# Patient Record
Sex: Female | Born: 1940 | Race: Black or African American | Hispanic: No | Marital: Married | Smoking: Former smoker
Health system: Southern US, Community
[De-identification: ages and names within clinical notes are randomized; demographics above are authoritative.]

## PROBLEM LIST (undated history)

## (undated) DIAGNOSIS — I5022 Chronic systolic (congestive) heart failure: Secondary | ICD-10-CM

## (undated) DIAGNOSIS — E119 Type 2 diabetes mellitus without complications: Secondary | ICD-10-CM

## (undated) DIAGNOSIS — R001 Bradycardia, unspecified: Secondary | ICD-10-CM

## (undated) DIAGNOSIS — I10 Essential (primary) hypertension: Secondary | ICD-10-CM

## (undated) DIAGNOSIS — I2510 Atherosclerotic heart disease of native coronary artery without angina pectoris: Secondary | ICD-10-CM

## (undated) DIAGNOSIS — E785 Hyperlipidemia, unspecified: Secondary | ICD-10-CM

## (undated) DIAGNOSIS — E669 Obesity, unspecified: Secondary | ICD-10-CM

## (undated) DIAGNOSIS — I959 Hypotension, unspecified: Secondary | ICD-10-CM

## (undated) DIAGNOSIS — I255 Ischemic cardiomyopathy: Secondary | ICD-10-CM

## (undated) DIAGNOSIS — I080 Rheumatic disorders of both mitral and aortic valves: Secondary | ICD-10-CM

## (undated) HISTORY — DX: Hyperlipidemia, unspecified: E78.5

## (undated) HISTORY — DX: Hypotension, unspecified: I95.9

## (undated) HISTORY — DX: Chronic systolic (congestive) heart failure: I50.22

## (undated) HISTORY — DX: Essential (primary) hypertension: I10

## (undated) HISTORY — DX: Obesity, unspecified: E66.9

## (undated) HISTORY — DX: Bradycardia, unspecified: R00.1

## (undated) HISTORY — DX: Type 2 diabetes mellitus without complications: E11.9

## (undated) HISTORY — DX: Rheumatic disorders of both mitral and aortic valves: I08.0

## (undated) HISTORY — DX: Ischemic cardiomyopathy: I25.5

## (undated) HISTORY — DX: Atherosclerotic heart disease of native coronary artery without angina pectoris: I25.10

---

## 2000-08-10 ENCOUNTER — Inpatient Hospital Stay (HOSPITAL_COMMUNITY): Admission: RE | Admit: 2000-08-10 | Discharge: 2000-08-19 | Payer: Self-pay | Admitting: Cardiology

## 2000-08-10 ENCOUNTER — Encounter: Payer: Self-pay | Admitting: Cardiology

## 2000-08-13 ENCOUNTER — Encounter: Payer: Self-pay | Admitting: Cardiothoracic Surgery

## 2000-08-13 HISTORY — PX: CORONARY ARTERY BYPASS GRAFT: SHX141

## 2000-08-14 ENCOUNTER — Encounter: Payer: Self-pay | Admitting: Cardiothoracic Surgery

## 2000-08-15 ENCOUNTER — Encounter: Payer: Self-pay | Admitting: Cardiothoracic Surgery

## 2000-08-16 ENCOUNTER — Encounter: Payer: Self-pay | Admitting: Cardiothoracic Surgery

## 2005-02-02 ENCOUNTER — Ambulatory Visit: Payer: Self-pay | Admitting: Cardiology

## 2005-02-08 ENCOUNTER — Ambulatory Visit: Payer: Self-pay | Admitting: Cardiology

## 2005-03-22 ENCOUNTER — Ambulatory Visit: Payer: Self-pay | Admitting: Cardiology

## 2005-11-20 ENCOUNTER — Ambulatory Visit: Payer: Self-pay | Admitting: Cardiology

## 2005-11-28 ENCOUNTER — Ambulatory Visit: Payer: Self-pay | Admitting: Cardiology

## 2006-12-17 ENCOUNTER — Ambulatory Visit: Payer: Self-pay | Admitting: Cardiology

## 2007-01-31 ENCOUNTER — Ambulatory Visit: Payer: Self-pay | Admitting: Cardiology

## 2007-03-04 ENCOUNTER — Ambulatory Visit: Payer: Self-pay | Admitting: Cardiology

## 2007-12-03 ENCOUNTER — Encounter: Payer: Self-pay | Admitting: Physician Assistant

## 2008-04-08 ENCOUNTER — Ambulatory Visit: Payer: Self-pay | Admitting: Cardiology

## 2008-04-13 ENCOUNTER — Ambulatory Visit: Payer: Self-pay | Admitting: Cardiology

## 2008-04-13 ENCOUNTER — Encounter: Payer: Self-pay | Admitting: Cardiology

## 2008-07-19 ENCOUNTER — Encounter: Payer: Self-pay | Admitting: Cardiology

## 2008-08-19 DIAGNOSIS — E663 Overweight: Secondary | ICD-10-CM

## 2008-08-19 DIAGNOSIS — I25810 Atherosclerosis of coronary artery bypass graft(s) without angina pectoris: Secondary | ICD-10-CM

## 2008-08-19 DIAGNOSIS — I080 Rheumatic disorders of both mitral and aortic valves: Secondary | ICD-10-CM

## 2008-08-19 DIAGNOSIS — I255 Ischemic cardiomyopathy: Secondary | ICD-10-CM

## 2008-08-19 DIAGNOSIS — E785 Hyperlipidemia, unspecified: Secondary | ICD-10-CM

## 2008-08-19 DIAGNOSIS — I10 Essential (primary) hypertension: Secondary | ICD-10-CM

## 2008-11-01 ENCOUNTER — Ambulatory Visit: Payer: Self-pay | Admitting: Cardiology

## 2009-05-16 ENCOUNTER — Ambulatory Visit: Payer: Self-pay | Admitting: Cardiology

## 2010-02-28 NOTE — Assessment & Plan Note (Signed)
Summary: 6 MO FU PER APRIL REMINDER  Medications Added FISH OIL 1000 MG CAPS (OMEGA-3 FATTY ACIDS) Take 3 tablet by mouth once daily IRON 325 (65 FE) MG TABS (FERROUS SULFATE) Take 2 tablet by mouth once a day VITAMIN D3 1000 UNIT CAPS (CHOLECALCIFEROL) Take 3 tablet by mouth once a day      Allergies Added:   Visit Type:  Follow-up Primary Provider:  Dr.Chirapa Sinthusek([place])  CC:  follow-up visit.  History of Present Illness: the patient is a 70 year old female history of coronary arteries, status post coronary bypass grafting in LV function of 40%. she has history of inferior posterior scar. She reports no heart failure symptoms. She denies any substernal chest pain. She has no orthopnea PND or patient's or syncope. She also history of mild mitralregurgitation but is asymptomatic. From a cardiovascular standpoint the patient is quite stable.  Clinical Review Panels:  Echocardiogram Echocardiogram 1. The images are fair 2. Mild concentric left ventricular hypertrophy is observed 3. there is severe hypokinesis of the inferior and posterior walls 4. the EF is 40% 5. The right ventricle is not well visualized, but is probably normal  6. No significant vavlular abnormalities are noted (04/13/2008)    Preventive Screening-Counseling & Management  Alcohol-Tobacco     Smoking Status: quit     Year Quit: 1998  Current Medications (verified): 1)  Furosemide 40 Mg Tabs (Furosemide) .... Take 1 Tablet By Mouth Once A Day 2)  Oscal 500/200 D-3 500-200 Mg-Unit Tabs (Calcium-Vitamin D) .... Take 1 Tablet By Mouth Once A Day 3)  Metformin Hcl 500 Mg Tabs (Metformin Hcl) .... Take 1 Tablet By Mouth Once A Day and 1/2 By Mouth in Pm 4)  Fish Oil 1000 Mg Caps (Omega-3 Fatty Acids) .... Take 3 Tablet By Mouth Once Daily 5)  Iron 325 (65 Fe) Mg Tabs (Ferrous Sulfate) .... Take 2 Tablet By Mouth Once A Day 6)  Multivitamins  Tabs (Multiple Vitamin) .... Take 1 Tablet By Mouth Once A  Day 7)  Simvastatin 40 Mg Tabs (Simvastatin) .... Take 1 Tablet By Mouth Once A Day 8)  Metoprolol Succinate 25 Mg Xr24h-Tab (Metoprolol Succinate) .... Take 1 Tablet By Mouth Once A Day 9)  Aspirin 81 Mg Tbec (Aspirin) .... Take 1 Tablet By Mouth Once A Day 10)  Allopurinol 300 Mg Tabs (Allopurinol) .... Take 1 Tablet By Mouth Once A Day 11)  Losartan Potassium 100 Mg Tabs (Losartan Potassium) .... Take 1 Tablet By Mouth Once A Day 12)  Vitamin D3 1000 Unit Caps (Cholecalciferol) .... Take 3 Tablet By Mouth Once A Day  Allergies (verified): 1)  ! Sulfa  Comments:  Nurse/Medical Assistant: The patient's medications and allergies were reviewed with the patient and were updated in the Medication and Allergy Lists. Bottles reviewed.  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  Alcohol Use - no  Risk Factors: Smoking Status: quit (05/16/2009)  Social History: Smoking Status:  quit  Vital Signs:  Patient profile:   70 year old female Height:      62 inches Weight:  209 pounds Pulse rate:   66 / minute BP sitting:   111 / 71  (left arm) Cuff size:   large  Vitals Entered By: Carlye Grippe (May 16, 2009 10:20 AM) CC: follow-up visit   Physical Exam  Additional Exam:  General: Well-developed, well-nourished in no distress head: Normocephalic and atraumatic eyes PERRLA/EOMI intact, conjunctiva and lids normal nose: No deformity or  lesions mouth normal dentition, normal posterior pharynx neck: Supple, no JVD.  No masses, thyromegaly or abnormal cervical nodes lungs: Normal breath sounds bilaterally without wheezing.  Normal percussion heart: regular rate and rhythm with normal S1 and S2, no S3 or S4.  PMI is normal.  No pathological murmurs abdomen: Normal bowel sounds, abdomen is soft and nontender without masses, organomegaly or hernias noted.  No hepatosplenomegaly musculoskeletal: Back normal, normal gait muscle strength and tone normal pulsus: Pulse is normal in all 4 extremities Extremities: No peripheral pitting edema neurologic: Alert and oriented x 3 skin: Intact without lesions or rashes cervical nodes: No significant adenopathy psychologic: Normal affect    EKG  Procedure date:  05/16/2009  Findings:      normal sinus rhythm. Old inferior infarct pattern. Poor R wave progression. Heart rate 66 beats per minute  Impression & Recommendations:  Problem # 1:  CAD, ARTERY BYPASS GRAFT (ICD-414.04) Stable no recurrent chest pain. No acute ischemic changes. Her updated medication list for this problem includes:    Metoprolol Succinate 25 Mg Xr24h-tab (Metoprolol succinate) .Marland Kitchen... Take 1 tablet by mouth once a day    Aspirin 81 Mg Tbec (Aspirin) .Marland Kitchen... Take 1 tablet by mouth once a day  Orders: EKG w/ Interpretation (93000)  Problem # 2:  HYPERTENSION, UNSPECIFIED (ICD-401.9) low pressures well controlled. Her updated medication list for this problem includes:    Furosemide 40 Mg Tabs (Furosemide) .Marland Kitchen... Take 1 tablet by mouth once a day    Metoprolol Succinate 25 Mg Xr24h-tab (Metoprolol succinate) .Marland Kitchen... Take 1 tablet by mouth once a day    Aspirin 81 Mg Tbec (Aspirin) .Marland Kitchen... Take 1 tablet by mouth once a day    Losartan Potassium 100 Mg Tabs (Losartan potassium) .Marland Kitchen... Take 1 tablet by mouth once a day  Problem # 3:  HYPERLIPIDEMIA-MIXED (ICD-272.4) lipid panel followed by her primary care  physician Her updated medication list for this problem includes:    Simvastatin 40 Mg Tabs (Simvastatin) .Marland Kitchen... 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  Patient Instructions: 1)  Your physician recommends that you continue on your current medications as directed. Please refer to the Current Medication list given to you today. 2)  Follow up in  1 year.

## 2010-03-02 ENCOUNTER — Encounter: Payer: Self-pay | Admitting: Cardiology

## 2010-03-16 NOTE — Letter (Signed)
Summary: External Correspondence/ [facility]  External Correspondence/ [facility]   Imported By: Dorise Hiss 03/03/2010 08:46:55  _____________________________________________________________________  External Attachment:    Type:   Image     Comment:   External Document

## 2010-05-25 ENCOUNTER — Encounter: Payer: Self-pay | Admitting: *Deleted

## 2010-05-26 ENCOUNTER — Encounter: Payer: Self-pay | Admitting: *Deleted

## 2010-05-26 ENCOUNTER — Ambulatory Visit (INDEPENDENT_AMBULATORY_CARE_PROVIDER_SITE_OTHER): Payer: Medicare Other | Admitting: Cardiology

## 2010-05-26 ENCOUNTER — Encounter: Payer: Self-pay | Admitting: Cardiology

## 2010-05-26 VITALS — BP 135/76 | HR 62 | Ht 62.0 in | Wt 209.0 lb

## 2010-05-26 DIAGNOSIS — I5022 Chronic systolic (congestive) heart failure: Secondary | ICD-10-CM

## 2010-05-26 DIAGNOSIS — I080 Rheumatic disorders of both mitral and aortic valves: Secondary | ICD-10-CM

## 2010-05-26 DIAGNOSIS — I10 Essential (primary) hypertension: Secondary | ICD-10-CM

## 2010-05-26 DIAGNOSIS — I2589 Other forms of chronic ischemic heart disease: Secondary | ICD-10-CM

## 2010-05-26 DIAGNOSIS — I25810 Atherosclerosis of coronary artery bypass graft(s) without angina pectoris: Secondary | ICD-10-CM

## 2010-05-26 MED ORDER — LISINOPRIL 10 MG PO TABS: 10.0000 mg | ORAL_TABLET | Freq: Every day | ORAL | Status: DC

## 2010-05-26 NOTE — Patient Instructions (Signed)
   Begin Lisinopril 10mg  daily Your physician recommends that you go to the [facility] for lab work in 7-10 days - BMET If the results of your test are normal or stable, you will receive a letter.  If they are abnormal, the nurse will contact you by phone. Your physician wants you to follow up in:  1 year.  You will receive a reminder letter in the mail one-two months in advance.  If you don't receive a letter, please call our office to schedule the follow up appointment

## 2010-05-27 NOTE — Progress Notes (Signed)
HPI The patient is a 70 year old female with a history of coronary artery disease, status post coronary bypass grafting with mild to moderate LV dysfunction ejection fraction 40%. By echocardiogram she has a history of inferior/posterior scar. She continues to report no heart failure symptoms. She denies any substernal chest pain. She has no history of orthopnea PND palpitations or syncope. She has not required any hospitalizations for congestive heart failure. She also has mild mitral regurgitation but is asymptomatic. The patient used to be on an ARB but this was discontinued. I'm not sure the exact reason as the patient does have diabetes mellitus and has LV dysfunction. His current also not on an ACE inhibitor.  Allergies  Allergen Reactions  . Sulfonamide Derivatives     REACTION: stomach upset    Current Outpatient Prescriptions on File Prior to Visit  Medication Sig Dispense Refill  . allopurinol (ZYLOPRIM) 300 MG tablet Take 300 mg by mouth daily.        Marland Kitchen aspirin 81 MG tablet Take 81 mg by mouth daily.        . calcium-vitamin D (OSCAL 500/200 D-3) 500-200 MG-UNIT per tablet Take 1 tablet by mouth daily.        . Cholecalciferol (VITAMIN D3) 1000 UNITS CAPS Take 1 capsule by mouth daily.       . furosemide (LASIX) 40 MG tablet Take 40 mg by mouth daily.        . metFORMIN (GLUCOPHAGE) 500 MG tablet Take 500 mg by mouth 2 (two) times daily with a meal.       . metoprolol succinate (TOPROL-XL) 25 MG 24 hr tablet Take 25 mg by mouth daily.        . Multiple Vitamin (MULTIVITAMIN) tablet Take 1 tablet by mouth daily.        . simvastatin (ZOCOR) 40 MG tablet Take 40 mg by mouth at bedtime.          Past Medical History  Diagnosis Date  . Overweight   . Unspecified essential hypertension   . Other and unspecified hyperlipidemia   . Mitral valve insufficiency and aortic valve insufficiency   . Coronary atherosclerosis of artery bypass graft   . Other specified forms of chronic  ischemic heart disease   . Type II or unspecified type diabetes mellitus without mention of complication, not stated as uncontrolled   . Congestive heart failure, unspecified     Past Surgical History  Procedure Date  . Coronary artery bypass graft 08/13/2000     [name]    No family history on file.  History   Social History  . Marital Status: Married    Spouse Name: OBEDIAH    Number of Children: N/A  . Years of Education: N/A   Occupational History  . RETIRED    Social History Main Topics  . Smoking status: Former Smoker    Quit date: 01/30/1996  . Smokeless tobacco: Never Used  . Alcohol Use: No  . Drug Use: No  . Sexually Active: Not on file   Other Topics Concern  . Not on file   Social History Narrative  . No narrative on file   Review of systems:Pertinent positives as outlined above. The remainder of the 18  point review of systems is negative  PHYSICAL EXAM BP 135/76  Pulse 62  Ht 5\' 2"  (1.575 m)  Wt 209 lb (94.802 kg)  BMI 38.23 kg/m2  General: Well-developed, well-nourished in no distress Head: Normocephalic and  atraumatic Eyes:PERRLA/EOMI intact, conjunctiva and lids normal Ears: No deformity or lesions Mouth:normal dentition, normal posterior pharynx Neck: Supple, no JVD.  No masses, thyromegaly or abnormal cervical nodes Lungs: Normal breath sounds bilaterally without wheezing.  Normal percussion Cardiac: regular rate and rhythm with normal S1 and S2, no S3 or S4.  PMI is normal.  No pathological murmurs Abdomen: Normal bowel sounds, abdomen is soft and nontender without masses, organomegaly or hernias noted.  No hepatosplenomegaly MSK: Back normal, normal gait muscle strength and tone normal Vascular: Pulse is normal in all 4 extremities Extremities: No peripheral pitting edema Neurologic: Alert and oriented x 3 Skin: Intact without lesions or rashes Lymphatics: No significant adenopathy Psychologic: Normal affect  ECG: Normal  sinus rhythm left ventricular hypertrophy. Nonspecific ST-T wave changes.    ASSESSMENT AND PLAN

## 2010-05-27 NOTE — Assessment & Plan Note (Signed)
The patient has a history of asymptomatic LV dysfunction. She has no congestive heart failure symptoms however given her LV dysfunction and the fact that she has diabetes mellitus she should be on an ACE inhibitor or ARB. We will start on lisinopril 10 mg by mouth daily and will check an electrolyte panel in one week.

## 2010-06-13 NOTE — Assessment & Plan Note (Signed)
[facility]                          [facility] OFFICE NOTE   NAME:[name], [name]                     MRN:          [id number]  DATE:01/31/2007                            DOB:          Jun 14, 1940    CARDIOLOGIST:  Learta Codding, M.D., [facility]   PRIMARY CARE PHYSICIAN:  Prescott Parma, MD   REASON FOR VISIT:  67-month follow-up.   HISTORY OF PRESENT ILLNESS:  [name] [name] is a 70 year old female patient  with a history of coronary artery disease status post posterolateral  myocardial infarction followed by subsequent CABG in July of 2002 who  presents to the office today for followup.  The patient was placed back  on Toprol-XL 12.5 mg daily when she saw [name] [name] on December 17, 2006.  Today she notes she is doing well.  She has had no significant side  effects from low-dose beta blocker.  She notes no intercurrent  development of chest pain or shortness of breath.  She denies syncope,  near-syncope.  She does have some lower extremity pain with ambulation  from time to time.  This has been ongoing for several years.  She notes  pain in her bilateral calves that will make her stop.  This is  questionable for claudication.   MEDICATIONS:  1. Furosemide 40 mg b.i.d.  2. Zocor 40 mg daily.  3. Enteric-coated aspirin 81 mg daily.  4. Metformin 1 gram daily.  5. Cozaar 50 mg half tablet daily.  6. Lantus insulin as directed.  7. Calcium.  8. Potassium 20 mEq half a tablet daily.  9. Iron 2 tablets daily.  10.Toprol-XL 25 mg half tablet daily.   PHYSICAL EXAMINATION:  GENERAL:  She is a well-nourished, well-developed  female in no acute distress.  VITAL SIGNS:  Blood pressure 120/80, pulse 63, weight 234.4 pounds.  HEENT:  Normal.  NECK:  Without JVD.  CARDIAC:  S1 and S2.  Regular rate and rhythm.  LUNGS:  Clear to auscultation bilaterally.  ABDOMEN:  Soft, nontender.  EXTREMITIES:  1+ edema bilaterally.  Calves soft, nontender.  SKIN:  Warm  and dry.  NEUROLOGIC:  She is alert and oriented x3.  Cranial nerves II-XII are  grossly intact.  VASCULAR EXAM:  I cannot appreciate any femoral artery bruits  bilaterally.  Her popliteal and dorsalis pedis and posterior tibialis  pulses are all difficult to palpate secondary to body habitus.   IMPRESSION:  1. Bilateral lower extremity pain with ambulation.      a.     Rule out claudication.  2. Ischemic cardiomyopathy.  Ejection fraction 35-40%.  3. Coronary artery disease.      a.     Status post posterolateral myocardial infarction followed by       subsequent coronary artery bypass graft in July of 2002.      b.     Partially reversible large inferior/inferolateral defect by       Cardiolite study in January of 2007.  4. Mild mitral regurgitation.  5. Hypertension.  6. Hyperlipidemia.  7. Diabetes mellitus.  8. Chronic lower extremity edema.  9. Obesity.   PLAN:  The patient presents to the office today for followup.  She was  placed on low-dose Toprol-XL at her last office visit.  She is doing  well and having no side effects.  She will continue her current  medications and follow up in 1 year's time as directed by [name] [name].  We  will check an echocardiogram prior to that visit to reassess her LV  function and to reassess her valvular status.  I have refilled her  simvastatin for today.   The patient has some lower extremity pain with claudication.  We will  set her up for ABIs and arterial Dopplers to rule out the possibility of  claudication.   She will continue to follow up with Dr. Dyann [name] for risk factor  modification.  Her goal LDL is less than 70.      [name] Newcomer, PA-C  Electronically Signed      Learta Codding, MD,FACC  Electronically Signed   SW/MedQ  DD: 01/31/2007  DT: 01/31/2007  Job #: [id number]   cc:   Prescott Parma

## 2010-06-13 NOTE — Assessment & Plan Note (Signed)
[facility]                          [facility]ICE NOTE   NAME:[name], [name] NASER                     MRN:          [id number]  DATE:12/17/2006                            DOB:          08/22/1940    PRIMARY CARDIOLOGIST:  Learta Codding, MD   REASON FOR VISIT:  Annual follow-up.   [name] [name] has done quite well since last seen here in the clinic in  October of last year with no interim development of signs/symptoms  suggestive of unstable angina pectoris, decompensated chronic systolic  heart failure, or tachypalpitations.  In fact, she is exercising on a  regular basis, enrolled in the [employer] program at the [employer].   The patient was, however, recently briefly hospitalized here at [facility]  a few months ago for treatment of left lower extremity cellulitis.  She  apparently did not have any associated cardiac complications.   Electrocardiogram today revealed NSR at 67 BPM with normal axis and  chronic, poor R-wave progression; evidence of remote inferior myocardial  infarction.   CURRENT MEDICATIONS:  1. Furosemide 40 mg b.i.d.  2. K-Dur 10 mEq daily.  3. Zocor 40 mg daily.  4. Aspirin 81 mg daily.  5. Metformin 1000 mg daily.  6. Cozaar 25 mg daily.  7. Lantus insulin.  8. Iron two tablets daily.   PHYSICAL EXAM:  Blood pressure 118/75, pulse 70 and regular, weight  233.8.  GENERAL:  A 70 year old female, mildly obese, sitting upright in no  distress.  HEENT:  Normocephalic, atraumatic.  NECK:  Palpable bilateral carotid pulses without bruits; unable to  assess JVD secondary to neck girth.  LUNGS:  Diminished breath sounds in the bases but without crackles or  wheezes.  HEART:  Regular rate and rhythm (S1, S2).  No significant murmurs.  No  rubs.  ABDOMEN:  Protuberant, nontender.  EXTREMITIES:  1+ bilateral pedal edema.  NEUROLOGIC:  No focal deficits.   IMPRESSION:  1. Ischemic cardiomyopathy.      a.      Ejection fraction of 35-40% with inferoposterior hypokinesis       by 2-D echo, October 2007.      b.     Ejection fraction 24% by perfusion imaging, January 2007.      c.     Status post posterolateral myocardial infarction/three-       vessel coronary artery bypass graft, July 2002.      d.     Large inferior/inferolateral defect (partially reversible);       ejection fraction 21% by adenosine Cardiolite, January 2007.  2. Valvular heart disease.      a.     History of mild mitral regurgitation.  3. Hypertension, well-controlled.  4. Hyperlipidemia.  5. Type 2 diabetes mellitus.  6. Chronic lower extremity edema.  7. Obesity.   PLAN:  1. Re-challenge with a beta blocker with Toprol XL 12.5 mg daily.  It      is not clear why the patient is no longer on a beta blocker, given      her history of prior myocardial  infarction and associated ischemic      cardiomyopathy.  Records indicate that she was on Toprol XL 25 mg      daily in the past, but this apparently was subsequently      discontinued.  She is amenable to trying this medication again and      we will closely monitor her blood pressure/pulse and up-titrate      accordingly.  2. Aggressive lipid management, per Dr. Virgina Organ, with target LDL goal      of 70 or less.  3. One-month clinic follow-up with myself and Dr. Andee Lineman for      reassessment of clinical status.  If the patient remains stable,      then she can return in one year.  I would suggest repeating an      echocardiogram at that time for reassessment of LV function and      severity of mitral regurgitation.      Gene Serpe, PA-C  Electronically Signed      Learta Codding, MD,FACC  Electronically Signed   GS/MedQ  DD: 12/17/2006  DT: 12/18/2006  Job #: [id number]   cc:   Colon Branch, MD

## 2010-06-13 NOTE — Assessment & Plan Note (Signed)
[facility]                          [facility] NOTE   NAME:[name] [name], [name] [name]                     MRN:          [id number]  DATE:04/08/2008                            DOB:          Jun 20, 1940    PRIMARY CARE PHYSICIAN:  Prescott Parma, MD   HISTORY OF PRESENT ILLNESS:  The patient is a 70 year old female with  history of coronary artery disease, status post posterolateral  myocardial infarction, followed by subsequent CABG in July 2002.  The  patient was last seen a year ago.  At that time, ABIs were ordered which  were essentially within normal limits.  The patient did have LV  dysfunction with ejection fraction of 40-45% and echocardiogram was  ordered but this was apparently never performed.  The patient states  that she has no chest pain.  She has mild shortness of breath on  exertion.  She has no palpitations or syncope.  She also has noticed  some worsening edema in the last couple months.   Electrocardiogram shows normal sinus rhythm with no acute ischemic  changes.  There is evidence for an inferior infarct and also poor R-wave  progression.   MEDICATIONS:  1. Cozaar 50 mg p.o. daily.  2. Metoprolol ER 25 mg half-tablet p.o. daily.  3. Simvastatin 20 mg p.o. daily.  4. Metformin 500 mg p.o. b.i.d.  5. Iron 65 mg p.o. daily.  6. Multivitamin 2 tablets daily.  7. Aspirin 81 mg p.o. daily.  8. Lasix 40 mg p.o. b.i.d.  9. Allopurinol 300 mg p.o.  10.Os-Cal 500 mg.  11.Vitamin D daily.   PHYSICAL EXAMINATION:  VITAL SIGNS:  Blood pressure is 110/65, heart  rate 62, and weight is 82.  GENERAL:  Well-nourished African American female in no apparent  distress.  HEENT:  Pupils isocoric.  Conjunctiva clear.  NECK:  Supple.  Normal carotid upstroke and no carotid bruits.  LUNGS:  Clear breath sounds bilaterally.  HEART:  Regular rate and rhythm with normal S1 and S2.  No murmur, rubs,  or gallops.  ABDOMEN:  Soft and nontender  with no rebound or guarding, and good bowel  sounds.  EXTREMITIES:  No cyanosis or clubbing.  There is 2+ peripheral pitting  edema.   PROBLEM LIST:  1. No evidence of claudication by ankle-brachial index.  2. Ischemic cardiomyopathy, ejection fraction 35-40%.  3. Coronary artery disease, status post coronary artery bypass      grafting.  4. Mild mitral regurgitation.  5. Hypertension.  6. Dyslipidemia.  7. Diabetes mellitus.  8. Obesity.   PLAN:  1. I did increase the patient's Lasix a little bit to 80 in the      morning and 40 in the evening.  We will a check BMET in 1 a week.  2. The patient also needs reassessment of her LV function due to the      fact that her ejection fraction 35-40% and she could have      ventricular remodelling, which would require a placement of ICD.  3. From a cardiovascular perspective, however, there are no  complaints.  The patient can continue with on her current medical      regimen.     Learta Codding, MD,FACC  Electronically Signed    GED/MedQ  DD: 04/08/2008  DT: 04/09/2008  Job #: [id number]   cc:   Prescott Parma

## 2010-06-16 NOTE — Cardiovascular Report (Signed)
[address]. [facility]  Patient:    [name] [name], [name] [name]                       MRN: [id number] Proc. Date: 08/09/00 Adm. Date:  [id number] Attending:  Ronaldo Miyamoto CC:         Madolyn Frieze. Jens Som, M.D. [facility]  Cardiac Catheterization Laboratory   Cardiac Catheterization  INDICATIONS:  Ms. Neth is a 70 year old African-American female who presents with progressive shortness of breath.  There was a wall motion abnormality and she was subsequently referred for further evaluation.  PROCEDURES: 1. Left and right heart catheterization. 2. Selective coronary arteriography. 3. Selective left ventriculography. 4. Subclavian angiography.  DESCRIPTION OF PROCEDURE:  The procedure was performed from the right femoral artery and right femoral vein.  Initially, left heart catheterization was performed from the left femoral artery.  We then used a smart needle to gain access to the right femoral vein.  Views of the coronary arteries were obtained in multiple angiographic projections.  Only one transducer was used, so simultaneous LV wedge pressures were not obtained.  The patient tolerated the procedure well.  Thermodilution cardiac outputs were performed.  There were no complications.  HEMODYNAMIC DATA: A. 1. Aorta:  161/94/120.    2. LV:  154/23/32.    3. RA:  29.    4. RV:  57/25.    5. Pulmonary artery:  57/35.    6. Pulmonary capillary wedge:  33.    7. No aortic to LV gradient.  B. 1. Cardiac output Fick:  3.5 L/min.    2. Fick cardiac index:  1.67 L/min/sq m.    3. Thermodilution cardiac output:  3.7 L/min.    4. Thermodilution cardiac index:  1.76 L/min/sq m.  C. SATURATIONS:    1. Pulmonary artery:  56.    2. Aorta:  95.  ANGIOGRAPHIC DATA: 1. Ventriculography was performed in the RAO projection.  There was    inferobasal hypo- to akinesis.  There was at least 2+ and possibly    3+ mitral regurgitation.  The inferobasal segment was hypo- to  akinetic.    The ejection fraction was calculated at 52%. 2. The left main coronary artery has about 40% to 50% distal narrowing. 3. The left anterior descending artery has a 90% stenosis just beyond the    septal perforator and then diffuse 50% narrowing beyond the large    diagonal.  The diagonal itself has about 50% narrowing.  There was a    ramus intermedius vessel that has some mild luminal irregularity, but    is free of critical disease. 4. The AV circumflex provides a marginal branch that is diffusely diseased    with about 70% proximal narrowing and then the vessel fills slowly    distally.  There is a retrograde filling of a large circumflex system. 5. The right coronary artery is severely diseased with 40% proximal narrowing    and a subtotal occlusion in the mid vessel.  There are multiple lesions of    50% and then 80% to 90% distally.  The PDA stops in the mid vessel and    there is competitive filling in the distal vessel.  The severity of disease    at this location cannot be ascertained.  The posterolateral branch is    large as well.  CONCLUSIONS: 1. Mild reduction of global left ventricular function with an inferobasilar    wall motion abnormality. 2. Patency  of the subclavian and internal mammary vessel. 3. Severe three-vessel coronary artery disease. 4. Moderate mitral regurgitation. 5. Pulmonary hypertension secondary to left ventricular dysfunction and    some mitral regurgitation, as well as ischemia.  DISPOSITION: 1. Surgical consultation for possible revascularization surgery. 2. The patient may need a TEE study. DD:  08/09/00 TD:  08/09/00 Job: [id number]

## 2010-06-16 NOTE — Assessment & Plan Note (Signed)
[facility]                            [facility] OFFICE NOTE   NAME:[name] [name], [name] [name]                     MRN:          [id number]  DATE:11/20/2005                            DOB:          [birth year]    PRIMARY CARDIOLOGIST:  Learta Codding, MD,FACC.   REASON FOR OFFICE VISIT:  Scheduled followup.   Since last seen here in the clinic in January 2007, by Dr. Andee Lineman, the  patient has lost 22 pounds by cutting back on her diet and engaging in a  water aerobic exercise program.  She reports no exertional angina pectoris,  dyspnea, PND, or orthopnea.  She has some mild, chronic lower extremity  edema with no recent exacerbation.  The patient also denies any tachy  palpitations.  The patient feels that in general, she has much more energy  and less exertional dyspnea since last seen in the clinic.   At that time, Dr. Andee Lineman did order a followup adenosine stress test for  surveillance.  This was notable for a large inferior/inferolateral defect  (partially reversible) and severe LV dilatation with an ejection fraction of  21%.  The ejection fraction was decreased compared to the previous stress  test result of 30% in 2002.  The patient's last echocardiogram in 2005, was  suggestive of mild mitral regurgitation left ventricular function, however,  was not assessed secondary to the extremely technically limited study.   CURRENT MEDICATIONS:  1. Furosemide 40 b.i.d.  2. Zocor 40 every day.  3. Aspirin 325 every day.  4. Metformin 1000 every day.  5. Cozaar 25 every day.  6. Lantus insulin as directed.  7. Potassium 10 mEq every day.  8. Over-the-counter iron tablets twice daily.   PHYSICAL EXAMINATION:  VITAL SIGNS:  Blood pressure 104/58, pulse 64  regular, weight 230 (down 22 pounds).  NECK:  Palpable carotid pulses without bruits.  No JVD at 90 degrees.  LUNGS:  Diminished breath sounds at the bases but without crackles or  wheezes.  HEART:   Regular rate and rhythm (S1 S2) with diminished heart sounds.  No  audible murmur.  ABDOMEN:  Protuberant, nontender.  EXTREMITIES:  Palpable posterior tibialis pulses with trace pedal edema.   IMPRESSION:  1. Ischemic cardiomyopathy.      a.     Ejection fraction 21% by most recent stress testing.      b.     Three-vessel coronary artery bypass graft, July 2002:  Saphenous       vein graft to left anterior descending artery; saphenous vein graft to       obtuse marginal; saphenous vein graft to posterior descending artery       branch.      c.     Mild left ventricular dysfunction by catheterization 2002.      d.     History of remote myocardial infarction.  2. History of mitral regurgitation, mild by echocardiogram, September      2005.  3. Type 2 diabetes mellitus.  4. Hypertension, well controlled.  5. Hyperlipidemia followed by Dr. Virgina Organ.   PLAN:  1. Repeat 2D echocardiogram for reassessment of left ventricular function      in light of the recent stress test which shows a decrease in EF from      her previous stress test, as well as for reassessment of her known      mitral regurgitation.  2. Decrease aspirin to 81 every day.  3. Resume clinical followup with Dr. Andee Lineman in one year or sooner if      needed.  We will apprise the patient of the results of the      echocardiogram and make further recommendations at that time.  Of note,      however, we may need to consider assessment for defibrillator      implantation if the patient does have a persistently depressed ejection      fraction.      ______________________________  Rozell Searing, PA-C    ______________________________  Learta Codding, MD,FACC    GS/MedQ  DD:  11/20/2005  DT:  11/21/2005  Job #:  [id number]   cc:   [place] Parma

## 2010-06-16 NOTE — Op Note (Signed)
[place]. [facility]  Patient:    ALEESE, KAMPS                       MRN: [id number] Proc. Date: 08/13/00 Adm. Date:  [id number] Attending:  [name] CC:         CVTS Office   Operative Report  ADDENDUM:  The anesthesiologist was unable to pass a Swan-Ganz catheter.  I was asked to place the preoperative pulmonary artery catheter.  The left shoulder was prepped and draped as a sterile field and under local 1% lidocaine anesthesia, the left subclavian vein was cannulated using the Seldinger technique.  Over a guide wire an introducer was placed into the vein.  Using hemodynamic monitoring, a pulmonary artery catheter was placed in the pulmonary artery and secured.  A chest x-ray was obtained in the preoperative area which confirmed proper placement of the pulmonary artery catheter.  The patient was then brought to the operating room for the surgical procedure as previously dictated. DD:  08/13/00 TD:  08/13/00 Job: [id number]

## 2010-06-16 NOTE — Op Note (Signed)
[place]. [facility]  Patient:    NAUDIA, CROSLEY                       MRN: [id number] Proc. Date: 08/13/00 Adm. Date:  [id number] Attending:  [name] CC:         CVTS Office  [facility]   Operative Report  OPERATION:  Coronary artery bypass grafting x 3 (saphenous vein graft to left anterior descending, saphenous vein graft to obtuse marginal, saphenous vein graft to posterior descending.)  PREOPERATIVE DIAGNOSIS:  Class IV unstable angina with severe three-vessel disease, status post inferior wall myocardial infarction.  POSTOPERATIVE DIAGNOSIS:  Class IV unstable angina with severe three-vessel disease, status post inferior wall myocardial infarction.  SURGEON:  [name], M.D.  ASSISTANT:  Maxwell Marion, RNFA  ANESTHESIA:  General.  ANESTHESIOLOGIST:  Judie Petit, M.D.  INDICATIONS:  The patient is a 70 year old, obese, black female, diabetic, who presents with symptoms of unstable angina.  Cardiac catheterization by Dr. Riley Kill demonstrated severe three-vessel disease with possible mitral regurgitation induced by the catheter.  She underwent chest wall 2-D echo which showed no significant mitral regurgitation with ejection fraction of 30-40%, status post posterolateral myocardial infarction.  She was referred for surgical coronary revascularization due to her bad coronary anatomy and symptoms of unstable angina.  Prior to the operation, I examined the patient in her hospital room and reviewed the results of the cardiac catheterization and preoperative studies with the patient and family.  I reviewed the indications and expected benefits of coronary artery bypass surgery.  I discussed the major aspects of the operation including the location of the surgical incisions, the use of general anesthesia and cardiopulmonary bypass, the choice of conduit, and the expected postoperative recovery.  I reviewed the  risks associated with this operation to the patient including the risks of MI, CVA, bleeding, wound infection, and death.  I reviewed the alternative therapy to treat her coronary disease other than surgery, and reviewed the fact that the cardiologist did not recommend angioplasty due to her diabetes and bad coronary anatomy.  She understood these implications for the surgery and agreed to proceed with the operation as planned under informed consent.  The preoperative chest wall echo showed no mitral regurgitation, and for that reason a transesophageal echo was not used during surgery and this was explained to the patient.  PROCEDURE:  The patient was brought to the operating room and placed supine on the operating room table where general anesthesia was induced under invasive hemodynamic monitoring.  The chest, abdomen, and legs were prepped with Betadine and draped as a sterile field.  A median sternotomy was performed as the vein was harvested from both lower legs.  The internal mammary artery was visualized, but was deeply embedded in fat, was thickened, and was not used as a conduit.  Heparin was administered and through purse-strings placed in the ascending aorta and right atrium, the patient was cannulated and placed on bypass.  The coronaries were identified and the vein grafts were prepared for grafting to the LAD, first OM, and posterior descending.  All of her coronaries were severely and diffusely calcified and diseased, consistent with a diabetic pattern.  The cardioplegia cannula was placed and the patient was cooled to 28 degrees.  As the aortic cross-clamp was applied, 500 cc of cold blood cardioplegia was delivered to the aortic root with immediate cardioplegic arrest and septal temperature dropping to less  than 12 degrees. Topical ice saline slush was used to augment myocardial preservation and a pericardial insulator pad was used to protect the left phrenic  nerve.  The distal coronary anastomoses were then performed.  The first distal anastomosis was to the posterior descending.  This was a heavily diseased vessel which was small and atretic distally.  A reverse saphenous vein was sewn end-to-side with a running 7-0 Prolene and there was flow through the graft.  This mainly fed the distal right and the posterolateral branch of the right coronary through retrograde flow.  The second distal anastomosis was to the first OM which was high on the obtuse marginal just under the atrial appendage.  It was diffusely diseased and was a 1.4 mm vessel.  A reverse saphenous vein was sewn end-to-side with running 7-0 Prolene with good flow through the graft.  The third distal anastomosis was to the LAD which was a 1.8 mm vessel with proximal 90% stenosis.  A reverse saphenous vein was sewn end-to-side with running 7-0 Prolene and there was good flow through the graft.  The cross-clamp was then removed.  The heart resumed a spontaneous rhythm.  Three proximal vein anastomoses were placed under a partial occluding clamp and running 6-0 Prolene with a 4.0 mm punch.  The partial clamp was removed and the vein grafts were perfused.  Each had excellent flow and hemostasis was documented at the proximal and distal sites.  The patient was rewarmed to 37 degrees and temporary pacing wires were applied.  When the patient reached 37 degrees, the lungs were re-expanded and the ventilator was resumed.  The patient was weaned from bypass on low dose dopamine with stable blood pressure and cardiac output.  Protamine was administered and the cannulas were removed.  The mediastinum was irrigated with warm antibiotic irrigation.  The leg incisions were irrigated and closed in a standard fashion.  The pericardium was loosely reapproximated superiorly over the aorta and vein grafts.  Two mediastinal chest tubes were placed and brought out through separate  incisions.  The  sternum was reapproximated with interrupted steel wire.  The pectoralis fascia was closed with interrupted #1 Vicryl.  The subcutaneous and skin were closed with Vicryl and sterile dressings were applied.  Cross-clamp time was 40 minutes and total bypass time was 100 minutes.  Due to the patients body habitus, exposure of the heart and aorta was extremely difficult.  The coronaries were severely and diffusely diseased.  A redo procedure would be extremely high-risk due to her short and obese anatomy and would be probably not safely possible. DD:  08/13/00 TD:  08/13/00 Job: [id number]

## 2010-06-29 ENCOUNTER — Other Ambulatory Visit: Payer: Self-pay | Admitting: *Deleted

## 2010-06-29 MED ORDER — LISINOPRIL 10 MG PO TABS: 10.0000 mg | ORAL_TABLET | Freq: Every day | ORAL | Status: DC

## 2010-12-13 ENCOUNTER — Encounter: Payer: Self-pay | Admitting: Cardiology

## 2010-12-13 ENCOUNTER — Ambulatory Visit (INDEPENDENT_AMBULATORY_CARE_PROVIDER_SITE_OTHER): Payer: Medicare Other | Admitting: Cardiology

## 2010-12-13 DIAGNOSIS — I080 Rheumatic disorders of both mitral and aortic valves: Secondary | ICD-10-CM

## 2010-12-13 DIAGNOSIS — I2589 Other forms of chronic ischemic heart disease: Secondary | ICD-10-CM

## 2010-12-13 DIAGNOSIS — I5022 Chronic systolic (congestive) heart failure: Secondary | ICD-10-CM

## 2010-12-13 DIAGNOSIS — R0602 Shortness of breath: Secondary | ICD-10-CM

## 2010-12-13 DIAGNOSIS — I25810 Atherosclerosis of coronary artery bypass graft(s) without angina pectoris: Secondary | ICD-10-CM

## 2010-12-13 MED ORDER — SPIRONOLACTONE 25 MG PO TABS: 25.0000 mg | ORAL_TABLET | Freq: Every day | ORAL | Status: DC

## 2010-12-13 NOTE — Assessment & Plan Note (Signed)
Consider nuclear stress imaging next office visit. Patient reports currently no angina.

## 2010-12-13 NOTE — Patient Instructions (Signed)
   Begin Spironolactone 25mg  daily Your physician recommends that you go to the [facility] for lab work for [employer] in 5 days If the results of your test are normal or stable, you will receive a letter.  If they are abnormal, the nurse will contact you by phone. Your physician wants you to follow up in: 6 months.  You will receive a reminder letter in the mail one-two months in advance.  If you don't receive a letter, please call our office to schedule the follow up appointment

## 2010-12-13 NOTE — Assessment & Plan Note (Signed)
Stable mitral regurgitation.

## 2010-12-13 NOTE — Assessment & Plan Note (Signed)
Bedside echocardiogram shows ejection fraction of 40%. We will continue to follow this closely in the future. Given the fact however the patient had a recent episode of congestive heart failure, I will have spironolactone 25 mg by mouth daily. The patient was told not to take any extra potassium. We will follow closely her BUN creatinine and potassium with laboratory work in 5 days.

## 2010-12-13 NOTE — Progress Notes (Signed)
CC: Followup patient ischemic cardio myopathy  HPI: The patient is a 70 year old female with history of coronary disease, status post coronary bypass grafting and moderate LV dysfunction with prior ejection fraction of 40%. She was recently seen in the emergency room when she presented with chest pain and shortness of breath. It was felt that the patient had mild congestive heart failure and was given extra dose of Lasix and also she was asked to increase her maintenance dose of Lasix to 40 mg a day. She has remained compliant with her other medications. She states since that time she is doing well. She denies any chest pain or shortness of breath. Currently she is in NYHA class IIb. She denies any orthopnea PND. She also reports resolution of lower extremity edema.  PMH: reviewed and listed in Problem List in Electronic Records (and see below)  Allergies/SH/FHX : available in Electronic Records for review  Medications: Current Outpatient Prescriptions  Medication Sig Dispense Refill  . allopurinol (ZYLOPRIM) 300 MG tablet Take 300 mg by mouth daily.        Marland Kitchen aspirin 81 MG tablet Take 81 mg by mouth daily.        . calcium-vitamin D (OSCAL 500/200 D-3) 500-200 MG-UNIT per tablet Take 1 tablet by mouth daily.        . Cholecalciferol (VITAMIN D3) 2000 UNITS capsule Take 2,000 Units by mouth daily.        . furosemide (LASIX) 40 MG tablet Take 20 mg by mouth daily.       Marland Kitchen lisinopril (PRINIVIL,ZESTRIL) 10 MG tablet Take 1 tablet (10 mg total) by mouth daily.  90 tablet  3  . metFORMIN (GLUCOPHAGE) 500 MG tablet Take 500 mg by mouth 2 (two) times daily with a meal.       . metoprolol succinate (TOPROL-XL) 25 MG 24 hr tablet Take 25 mg by mouth daily.        . Multiple Vitamin (MULTIVITAMIN) tablet Take 1 tablet by mouth daily.        Marland Kitchen omega-3 acid ethyl esters (LOVAZA) 1 G capsule Take 2 g by mouth 2 (two) times daily.        . simvastatin (ZOCOR) 40 MG tablet Take 40 mg by mouth at bedtime.           ROS: No nausea or vomiting. No fever or chills.No melena or hematochezia.No bleeding.No claudication  Physical Exam: BP 110/59  Pulse 55  Ht 5\' 2"  (1.575 m)  Wt 211 lb (95.709 kg)  BMI 38.59 kg/m2 General: Overweight African American female but in no distress Neck: Normal carotid upstroke no carotid bruits. No thyromegaly nonnodular thyroid Lungs: Clear breath sounds bilaterally without wheezing Cardiac: Regular rate and rhythm with normal S1-S2 no murmur rubs or gallops Vascular: No edema. Normal dorsalis and posterior tibial pulses  Skin: No edema  12lead ECG: Limited bedside ECHO: LV dysfunction ejection fraction 40%. Inferoposterior akinesis. Mild mitral regurgitation    Assessment and Plan

## 2011-06-12 ENCOUNTER — Ambulatory Visit (INDEPENDENT_AMBULATORY_CARE_PROVIDER_SITE_OTHER): Payer: Medicare Other | Admitting: Cardiology

## 2011-06-12 ENCOUNTER — Encounter: Payer: Self-pay | Admitting: Cardiology

## 2011-06-12 VITALS — BP 115/60 | HR 54 | Resp 16 | Ht 62.0 in | Wt 212.0 lb

## 2011-06-12 DIAGNOSIS — N189 Chronic kidney disease, unspecified: Secondary | ICD-10-CM | POA: Insufficient documentation

## 2011-06-12 DIAGNOSIS — I25810 Atherosclerosis of coronary artery bypass graft(s) without angina pectoris: Secondary | ICD-10-CM

## 2011-06-12 DIAGNOSIS — I509 Heart failure, unspecified: Secondary | ICD-10-CM

## 2011-06-12 DIAGNOSIS — E785 Hyperlipidemia, unspecified: Secondary | ICD-10-CM

## 2011-06-12 DIAGNOSIS — E663 Overweight: Secondary | ICD-10-CM

## 2011-06-12 DIAGNOSIS — I080 Rheumatic disorders of both mitral and aortic valves: Secondary | ICD-10-CM

## 2011-06-12 DIAGNOSIS — I10 Essential (primary) hypertension: Secondary | ICD-10-CM

## 2011-06-12 DIAGNOSIS — N289 Disorder of kidney and ureter, unspecified: Secondary | ICD-10-CM

## 2011-06-12 NOTE — Assessment & Plan Note (Signed)
No pathological murmurs on exam. Only mild mitral regurgitation by more recent bedside echocardiogram 2012 as well as a prior echocardiogram in 2010.

## 2011-06-12 NOTE — Assessment & Plan Note (Signed)
Patient's LDL is at goal and is followed by primary care physician.

## 2011-06-12 NOTE — Assessment & Plan Note (Signed)
Patient has had no hospitalizations over the last several years for congestive heart failure. She is in NYHA class II. No further adjustments in medications is required.

## 2011-06-12 NOTE — Assessment & Plan Note (Signed)
No recurrent chest pain. No indication for stress testing at the present time 

## 2011-06-12 NOTE — Patient Instructions (Signed)
Continue all current medications. Your physician wants you to follow up in: 6 months.  You will receive a reminder letter in the mail one-two months in advance.  If you don't receive a letter, please call our office to schedule the follow up appointment   

## 2011-06-12 NOTE — Assessment & Plan Note (Signed)
I encouraged increased fluid intake and also told the patient to make sure that she does not take any extra potassium supplements and tries to avoid a potassium rich diet. The patient has followup laboratory work schedule in July of 2013. Further followup regarding renal insufficiency and risk for hyperkalemia on spironolactone per her primary care physician.

## 2011-06-12 NOTE — Assessment & Plan Note (Signed)
Blood pressure under good control no further adjustments in medications as needed

## 2011-06-12 NOTE — Progress Notes (Signed)
Peyton Bottoms, MD, [facility] ABIM Board Certified in Adult Cardiovascular Medicine,Internal Medicine and Critical Care Medicine    CC: Followup patient with coronary artery disease status post coronary bypass grafting and moderate LV dysfunction.  HPI:  The patient has not required any hospitalizations for heart failure. Her ejection fraction is 40% and has remained stable over the last several years. We performed a bedside echocardiogram during the last visit and there was no significant mitral regurgitation or other significant regurgitant lesions. The patient has not reported any chest pain shortness of breath orthopnea or PND. She is in NYHA class II. She has done well with the addition of spironolactone during her last clinic visit. She had recent blood work done which showed slight increase in her creatinine to 1.4 but is stable potassium of 4.7. She has additional blood work scheduled in the next couple months. The patient reports no palpitations presyncope or syncope. She has otherwise remained stable from a cardiovascular perspective. She is compliant with her medical regimen and reports no side effects.  PMH: reviewed and listed in Problem List in Electronic Records (and see below) Past Medical History  Diagnosis Date  . Overweight   . Unspecified essential hypertension   . Other and unspecified hyperlipidemia   . Mitral valve insufficiency and aortic valve insufficiency   . Coronary atherosclerosis of artery bypass graft   . 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 Type II or unspecified type diabetes mellitus without mention of complication, not stated as uncontrolled   . Congestive heart failure, unspecified     Ejection fraction 40%. Bedside echocardiogram 2012 ejection fraction 40%.   Past Surgical History  Procedure Date  . Coronary artery bypass graft 08/13/2000     [name]    Allergies/SH/FHX : available in Electronic Records for review    Allergies  Allergen Reactions  . Sulfonamide Derivatives     REACTION: stomach upset   History   Social History  . Marital Status: Married    Spouse Name: OBEDIAH    Number of Children: N/A  . Years of Education: N/A   Occupational History  . RETIRED    Social History Main Topics  . Smoking status: Former Smoker -- 1.5 packs/day for 15 years    Types: Cigarettes    Quit date: 01/30/1996  . Smokeless tobacco: Never Used  . Alcohol Use: No  . Drug Use: No  . Sexually Active: Not on file   Other Topics Concern  . Not on file   Social History Narrative  . No narrative on file   No family history on file.  Medications: Current Outpatient Prescriptions  Medication Sig Dispense Refill  . allopurinol (ZYLOPRIM) 300 MG tablet Take 300 mg by mouth daily.        Marland Kitchen aspirin 81 MG tablet Take 81 mg by mouth daily.        . calcium-vitamin D (OSCAL 500/200 D-3) 500-200 MG-UNIT per tablet Take 1 tablet by mouth daily.        . Cholecalciferol (VITAMIN D3) 2000 UNITS capsule Take 2,000 Units by mouth daily.        . furosemide (LASIX) 40 MG tablet Take 20 mg by mouth daily.       . insulin lispro (HUMALOG) 100 UNIT/ML injection Inject into the skin as directed.      Marland Kitchen lisinopril (PRINIVIL,ZESTRIL) 10 MG tablet Take 1 tablet (10 mg total) by mouth daily.  90 tablet  3  . metoprolol succinate (TOPROL-XL) 25 MG 24 hr tablet Take 25 mg by mouth daily.        . Multiple Vitamin (MULTIVITAMIN) tablet Take 1 tablet by mouth daily.        Marland Kitchen omega-3 acid ethyl esters (LOVAZA) 1 G capsule Take 2 g by mouth 2 (two) times daily.        . simvastatin (ZOCOR) 40 MG tablet Take 40 mg by mouth at bedtime.        Marland Kitchen spironolactone (ALDACTONE) 25 MG tablet Take 1 tablet (25 mg total) by mouth daily.  30 tablet  6    ROS: No nausea or vomiting. No fever or chills.No melena or hematochezia.No bleeding.No claudication  Physical Exam: BP 115/60  Pulse 54  Resp 16  Ht 5\' 2"  (1.575 m)  Wt 212 lb  (96.163 kg)  BMI 38.78 kg/m2 General: Well-nourished African American female overweight but in no distress Neck: Short neck JVD is difficult to evaluate. Normal carotid upstroke no carotid bruits. No thyromegaly nonnodular thyroid.  Lungs: Clear breath sounds bilaterally without wheezing Cardiac: Regular rate and rhythm with normal S1-S2 and no murmur rubs or gallops. Vascular: No edema. Normal distal pulses bilaterally Skin: Warm and dry Physcologic: Normal affect  12lead ECG: Not obtained Limited bedside ECHO:N/A No images are attached to the encounter.   Assessment and Plan  OVERWEIGHT/OBESITY I encouraged the patient to lose weight and cut back on her carbohydrate intake.  MITRAL REGURG W/ AORTIC INSUFF, RHEUM/NON-RHEUM No pathological murmurs on exam. Only mild mitral regurgitation by more recent bedside echocardiogram 2012 as well as a prior echocardiogram in 2010.  HYPERTENSION, UNSPECIFIED Blood pressure under good control no further adjustments in medications as needed  HYPERLIPIDEMIA-MIXED Patient's LDL is at goal and is followed by primary care physician.  Congestive heart failure, unspecified Patient has had no hospitalizations over the last several years for congestive heart failure. She is in NYHA class II. No further adjustments in medications is required.  CAD, ARTERY BYPASS GRAFT No recurrent chest pain. No indication for stress testing at the present time.  Chronic renal insufficiency I encouraged increased fluid intake and also told the patient to make sure that she does not take any extra potassium supplements and tries to avoid a potassium rich diet. The patient has followup laboratory work schedule in July of 2013. Further followup regarding renal insufficiency and risk for hyperkalemia on spironolactone per her primary care physician.    Patient Active Problem List  Diagnoses  . HYPERLIPIDEMIA-MIXED  . OVERWEIGHT/OBESITY  . MITRAL REGURG W/ AORTIC  INSUFF, RHEUM/NON-RHEUM  . HYPERTENSION, UNSPECIFIED  . CAD, ARTERY BYPASS GRAFT  . CARDIOMYOPATHY, ISCHEMIC  . Congestive heart failure, unspecified  . Chronic renal insufficiency

## 2011-06-12 NOTE — Assessment & Plan Note (Signed)
I encouraged the patient to lose weight and cut back on her carbohydrate intake.

## 2011-07-04 ENCOUNTER — Other Ambulatory Visit: Payer: Self-pay | Admitting: Cardiology

## 2011-12-05 ENCOUNTER — Other Ambulatory Visit: Payer: Self-pay | Admitting: Cardiology

## 2011-12-05 MED ORDER — SPIRONOLACTONE 25 MG PO TABS: 25.0000 mg | ORAL_TABLET | Freq: Every day | ORAL | Status: DC

## 2012-01-10 ENCOUNTER — Other Ambulatory Visit: Payer: Self-pay | Admitting: Cardiology

## 2012-01-10 MED ORDER — SPIRONOLACTONE 25 MG PO TABS: 25.0000 mg | ORAL_TABLET | Freq: Every day | ORAL | Status: DC

## 2012-04-09 ENCOUNTER — Encounter: Payer: Self-pay | Admitting: Physician Assistant

## 2012-04-09 ENCOUNTER — Ambulatory Visit (INDEPENDENT_AMBULATORY_CARE_PROVIDER_SITE_OTHER): Payer: Medicare Other | Admitting: Physician Assistant

## 2012-04-09 VITALS — BP 130/65 | HR 52 | Ht 62.0 in | Wt 222.8 lb

## 2012-04-09 DIAGNOSIS — I25810 Atherosclerosis of coronary artery bypass graft(s) without angina pectoris: Secondary | ICD-10-CM

## 2012-04-09 DIAGNOSIS — I10 Essential (primary) hypertension: Secondary | ICD-10-CM

## 2012-04-09 DIAGNOSIS — I080 Rheumatic disorders of both mitral and aortic valves: Secondary | ICD-10-CM

## 2012-04-09 DIAGNOSIS — I498 Other specified cardiac arrhythmias: Secondary | ICD-10-CM

## 2012-04-09 DIAGNOSIS — R001 Bradycardia, unspecified: Secondary | ICD-10-CM

## 2012-04-09 DIAGNOSIS — I2589 Other forms of chronic ischemic heart disease: Secondary | ICD-10-CM

## 2012-04-09 DIAGNOSIS — E785 Hyperlipidemia, unspecified: Secondary | ICD-10-CM

## 2012-04-09 NOTE — Assessment & Plan Note (Signed)
Previously assessed as mild, we'll reassess with complete echocardiogram. No significant murmurs were present on exam.

## 2012-04-09 NOTE — Assessment & Plan Note (Signed)
Stable on current regimen   

## 2012-04-09 NOTE — Assessment & Plan Note (Addendum)
Quiescent on current medication regimen. We'll reassess LVF with complete echocardiogram. If this remains unchanged, or has since improved, then no further workup is indicated. Patient presents with no symptoms suggestive of angina or CHF.

## 2012-04-09 NOTE — Assessment & Plan Note (Signed)
Asymptomatic. Continue low-dose Toprol for treatment of cardiomyopathy.

## 2012-04-09 NOTE — Patient Instructions (Signed)
   Echo  Office will contact with results Continue all current medications. Your physician wants you to follow up in:  1 year.  You will receive a reminder letter in the mail one-two months in advance.  If you don't receive a letter, please call our office to schedule the follow up appointment  

## 2012-04-09 NOTE — Assessment & Plan Note (Signed)
Followed by primary M.D. Recommend aggressive management with target LDL 70 or less, if feasible.

## 2012-04-09 NOTE — Progress Notes (Signed)
Primary Cardiologist: Simona Huh, MD (new)   HPI: Scheduled six-month followup.  Patient returns to office after a prolonged hiatus, last seen here in May 2013, by Dr. Andee Lineman.  She denies any interim development of symptoms suggestive of unstable angina pectoris, congestive heart failure, or near-syncope/syncope. She also denies any interim hospitalizations for cardiac related symptoms.   12-lead EKG today, reviewed by me, indicates SB 52 bpm; LAD; LVH repolarization changes in high lateral leads; poor R wave progression. Essentially unchanged since prior study in 2012.  Allergies  Allergen Reactions  . Sulfonamide Derivatives     REACTION: stomach upset    Current Outpatient Prescriptions  Medication Sig Dispense Refill  . allopurinol (ZYLOPRIM) 300 MG tablet Take 300 mg by mouth daily.        Marland Kitchen aspirin 81 MG tablet Take 81 mg by mouth daily.        . calcium-vitamin D (OSCAL 500/200 D-3) 500-200 MG-UNIT per tablet Take 1 tablet by mouth daily.        . Cholecalciferol (VITAMIN D3) 2000 UNITS capsule Take 2,000 Units by mouth daily.        . furosemide (LASIX) 40 MG tablet Take 40 mg by mouth daily.       . insulin lispro (HUMALOG) 100 UNIT/ML injection Inject into the skin as directed.      Marland Kitchen lisinopril (PRINIVIL,ZESTRIL) 10 MG tablet Take 1 tablet (10 mg total) by mouth daily.  90 tablet  3  . metoprolol succinate (TOPROL-XL) 25 MG 24 hr tablet Take 25 mg by mouth daily.        . Multiple Vitamin (MULTIVITAMIN) tablet Take 1 tablet by mouth daily.        Marland Kitchen omega-3 acid ethyl esters (LOVAZA) 1 G capsule Take 2 g by mouth 2 (two) times daily.        . simvastatin (ZOCOR) 40 MG tablet Take 40 mg by mouth at bedtime.        Marland Kitchen spironolactone (ALDACTONE) 25 MG tablet Take 1 tablet (25 mg total) by mouth daily.  90 tablet  0   No current facility-administered medications for this visit.    Past Medical History  Diagnosis Date  . Overweight   . Unspecified essential hypertension    . Other and unspecified hyperlipidemia   . Mitral valve insufficiency and aortic valve insufficiency   . Coronary atherosclerosis of artery bypass graft   . 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 Type II or unspecified type diabetes mellitus without mention of complication, not stated as uncontrolled   . Congestive heart failure, unspecified     Ejection fraction 40%. Bedside echocardiogram 2012 ejection fraction 40%.    Past Surgical History  Procedure Laterality Date  . Coronary artery bypass graft  08/13/2000     [name] [name]    History   Social History  . Marital Status: Married    Spouse Name: OBEDIAH    Number of Children: N/A  . Years of Education: N/A   Occupational History  . RETIRED    Social History Main Topics  . Smoking status: Former Smoker -- 1.50 packs/day for 15 years    Types: Cigarettes    Quit date: 01/30/1996  . Smokeless tobacco: Never Used  . Alcohol Use: No  . Drug Use: No  . Sexually Active: Not on file   Other Topics Concern  . Not on file   Social History Narrative  . No narrative on  file    No family history on file.  ROS: no nausea, vomiting; no fever, chills; no melena, hematochezia; no claudication  PHYSICAL EXAM: BP 130/65  Pulse 52  Ht 5\' 2"  (1.575 m)  Wt 222 lb 12.8 oz (101.061 kg)  BMI 40.74 kg/m2 GENERAL: 72 year old female, morbidly obese; NAD HEENT: NCAT, PERRLA, EOMI; sclera clear; no xanthelasma NECK: no bruits; no JVD; no TM LUNGS: CTA bilaterally CARDIAC: RRR (S1, S2); no significant murmurs; no rubs or gallops ABDOMEN: soft, protuberant EXTREMETIES: no significant peripheral edema SKIN: warm/dry; no obvious rash/lesions MUSCULOSKELETAL: no joint deformity NEURO: no focal deficit; NL affect   EKG: reviewed and available in Electronic Records   ASSESSMENT & PLAN:  CARDIOMYOPATHY, ISCHEMIC Quiescent on current medication regimen. We'll reassess LVF with complete echocardiogram.  If this remains unchanged, or has since improved, then no further workup is indicated. Patient presents with no symptoms suggestive of angina or CHF.  MITRAL REGURG W/ AORTIC INSUFF, RHEUM/NON-RHEUM Previously assessed as mild, we'll reassess with complete echocardiogram. No significant murmurs were present on exam.  HYPERLIPIDEMIA-MIXED Followed by primary M.D. Recommend aggressive management with target LDL 70 or less, if feasible.  HYPERTENSION, UNSPECIFIED Stable on current regimen  Sinus bradycardia Asymptomatic. Continue low-dose Toprol for treatment of cardiomyopathy.    Gene Jahvon Gosline, PAC

## 2012-04-17 ENCOUNTER — Other Ambulatory Visit (INDEPENDENT_AMBULATORY_CARE_PROVIDER_SITE_OTHER): Payer: Medicare Other

## 2012-04-17 ENCOUNTER — Other Ambulatory Visit: Payer: Self-pay

## 2012-04-17 DIAGNOSIS — I080 Rheumatic disorders of both mitral and aortic valves: Secondary | ICD-10-CM

## 2012-04-17 DIAGNOSIS — I2589 Other forms of chronic ischemic heart disease: Secondary | ICD-10-CM

## 2012-04-24 ENCOUNTER — Telehealth: Payer: Self-pay | Admitting: *Deleted

## 2012-04-24 NOTE — Telephone Encounter (Signed)
Message copied by Lesle Chris on Thu Apr 24, 2012  9:26 AM ------      Message from: Prescott Parma C      Created: Wed Apr 23, 2012  3:57 PM       EF unchanged at 40%; mild MR. No further workup ------

## 2012-04-24 NOTE — Telephone Encounter (Signed)
Notes Recorded by Lesle Chris, LPN on 1/61/0960 at 9:26 AM Patient notified and verbalized understanding.

## 2013-04-02 ENCOUNTER — Ambulatory Visit (INDEPENDENT_AMBULATORY_CARE_PROVIDER_SITE_OTHER): Payer: Medicare Other | Admitting: Cardiology

## 2013-04-02 ENCOUNTER — Encounter: Payer: Self-pay | Admitting: Cardiology

## 2013-04-02 VITALS — BP 113/74 | HR 67 | Ht 62.0 in | Wt 227.0 lb

## 2013-04-02 DIAGNOSIS — I059 Rheumatic mitral valve disease, unspecified: Secondary | ICD-10-CM

## 2013-04-02 DIAGNOSIS — I340 Nonrheumatic mitral (valve) insufficiency: Secondary | ICD-10-CM

## 2013-04-02 DIAGNOSIS — I25810 Atherosclerosis of coronary artery bypass graft(s) without angina pectoris: Secondary | ICD-10-CM

## 2013-04-02 DIAGNOSIS — E785 Hyperlipidemia, unspecified: Secondary | ICD-10-CM

## 2013-04-02 DIAGNOSIS — I5022 Chronic systolic (congestive) heart failure: Secondary | ICD-10-CM

## 2013-04-02 MED ORDER — METOPROLOL SUCCINATE ER 25 MG PO TB24: 12.5000 mg | ORAL_TABLET | Freq: Every day | ORAL | Status: DC

## 2013-04-02 NOTE — Progress Notes (Signed)
Clinical Summary [name] [name] is a 73 y.o.female former patient of Dr [name] [name], this is our first visit together. She is seen for the following medical problems.   1. Chronic systolic heart failue/Ischemic cardiomyopathy - prior CABG 3 vessel in 2002 (SVG-LAD, SVG-OM, SVG-PDA).  - LVEF 40% by echo 03/2012 - no recent chest pain. Can walk approx 1 block prior to getting SOB which is stable. No orthopnea, no PND, + LE edema - compliant with meds, reports she was told to stop her beta blocker by pcp. Denies any lightheadedness or dizzines. Was previously on Toprol XL 25mg  daily.   2. Mitral regurgitation - mild by most recent echo 03/2012 - no new SOB, occas LE edema  3. Hyperlipidemia - reports recent lipid panel from pcp that she was told was good.  - compliant with statin  4. Sinus bradycardia - noted during prior visits - denies any symptoms, I suspect this is why her beta blocker was stopped.    Past Medical History  Diagnosis Date  . Overweight   . Unspecified essential hypertension   . Other and unspecified hyperlipidemia   . Mitral valve insufficiency and aortic valve insufficiency   . Coronary atherosclerosis of artery bypass graft   . 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 Type II or unspecified type diabetes mellitus without mention of complication, not stated as uncontrolled   . Congestive heart failure, unspecified     Ejection fraction 40%. Bedside echocardiogram 2012 ejection fraction 40%.     Allergies  Allergen Reactions  . Sulfonamide Derivatives     REACTION: stomach upset     Current Outpatient Prescriptions  Medication Sig Dispense Refill  . allopurinol (ZYLOPRIM) 300 MG tablet Take 300 mg by mouth daily.        Marland Kitchen aspirin 81 MG tablet Take 81 mg by mouth daily.        . calcium-vitamin D (OSCAL 500/200 D-3) 500-200 MG-UNIT per tablet Take 1 tablet by mouth daily.        . Cholecalciferol (VITAMIN D3) 2000 UNITS capsule Take  2,000 Units by mouth daily.        . furosemide (LASIX) 40 MG tablet Take 40 mg by mouth daily.       . insulin lispro (HUMALOG) 100 UNIT/ML injection Inject into the skin as directed.      Marland Kitchen lisinopril (PRINIVIL,ZESTRIL) 10 MG tablet Take 1 tablet (10 mg total) by mouth daily.  90 tablet  3  . metoprolol succinate (TOPROL-XL) 25 MG 24 hr tablet Take 25 mg by mouth daily.        . Multiple Vitamin (MULTIVITAMIN) tablet Take 1 tablet by mouth daily.        Marland Kitchen omega-3 acid ethyl esters (LOVAZA) 1 G capsule Take 2 g by mouth 2 (two) times daily.        . simvastatin (ZOCOR) 40 MG tablet Take 40 mg by mouth at bedtime.        Marland Kitchen spironolactone (ALDACTONE) 25 MG tablet Take 1 tablet (25 mg total) by mouth daily.  90 tablet  0   No current facility-administered medications for this visit.     Past Surgical History  Procedure Laterality Date  . Coronary artery bypass graft  08/13/2000     [name] [name]     Allergies  Allergen Reactions  . Sulfonamide Derivatives     REACTION: stomach upset      No family history  on file.   Social History Ms. [name] [name] reports that she quit smoking about 17 years ago. Her smoking use included Cigarettes. She has a 22.5 pack-year smoking history. She has never used smokeless tobacco. Ms. [name] [name] reports that she does not drink alcohol.   Review of Systems CONSTITUTIONAL: No weight loss, fever, chills, weakness or fatigue.  HEENT: Eyes: No visual loss, blurred vision, double vision or yellow sclerae.No hearing loss, sneezing, congestion, runny nose or sore throat.  SKIN: No rash or itching.  CARDIOVASCULAR: per HPI RESPIRATORY: No shortness of breath, cough or sputum.  GASTROINTESTINAL: No anorexia, nausea, vomiting or diarrhea. No abdominal pain or blood.  GENITOURINARY: No burning on urination, no polyuria NEUROLOGICAL: No headache, dizziness, syncope, paralysis, ataxia, numbness or tingling in the extremities. No change in bowel or bladder  control.  MUSCULOSKELETAL: No muscle, back pain, joint pain or stiffness.  LYMPHATICS: No enlarged nodes. No history of splenectomy.  PSYCHIATRIC: No history of depression or anxiety.  ENDOCRINOLOGIC: No reports of sweating, cold or heat intolerance. No polyuria or polydipsia.  Marland Kitchen.   Physical Examination p 67 bp 113/74 Wt 227 lbs BMI 42 Gen: resting comfortably, no acute distress HEENT: no scleral icterus, pupils equal round and reactive, no palptable cervical adenopathy,  CV: RRR, no m/r/g, no JVD, no carotid bruits Resp: Clear to auscultation bilaterally GI: abdomen is soft, non-tender, non-distended, normal bowel sounds, no hepatosplenomegaly MSK: extremities are warm, trace bilateral edema Skin: warm, no rash Neuro:  no focal deficits Psych: appropriate affect   Diagnostic Studies Cath 07/2000 HEMODYNAMIC DATA:  A. 1. Aorta: 161/94/120.  2. LV: 154/23/32.  3. RA: 29.  4. RV: 57/25.  5. Pulmonary artery: 57/35.  6. Pulmonary capillary wedge: 33.  7. No aortic to LV gradient.  B. 1. Cardiac output Fick: 3.5 L/min.  2. Fick cardiac index: 1.67 L/min/sq m.  3. Thermodilution cardiac output: 3.7 L/min.  4. Thermodilution cardiac index: 1.76 L/min/sq m.  C. SATURATIONS:  1. Pulmonary artery: 56.  2. Aorta: 95.  ANGIOGRAPHIC DATA:  1. Ventriculography was performed in the RAO projection. There was  inferobasal hypo- to akinesis. There was at least 2+ and possibly  3+ mitral regurgitation. The inferobasal segment was hypo- to akinetic.  The ejection fraction was calculated at 52%.  2. The left main coronary artery has about 40% to 50% distal narrowing.  3. The left anterior descending artery has a 90% stenosis just beyond the  septal perforator and then diffuse 50% narrowing beyond the large  diagonal. The diagonal itself has about 50% narrowing. There was a  ramus intermedius vessel that has some mild luminal irregularity, but  is free of critical disease.  4. The AV  circumflex provides a marginal [name] [name] that is diffusely diseased  with about 70% proximal narrowing and then the vessel fills slowly  distally. There is a retrograde filling of a large circumflex system.  5. The right coronary artery is severely diseased with 40% proximal narrowing  and a subtotal occlusion in the mid vessel. There are multiple lesions of  50% and then 80% to 90% distally. The PDA stops in the mid vessel and  there is competitive filling in the distal vessel. The severity of disease  at this location cannot be ascertained. The posterolateral Lashelle Koy is  large as well.  CONCLUSIONS:  1. Mild reduction of global left ventricular function with an inferobasilar  wall motion abnormality.  2. Patency of the subclavian and internal mammary vessel.  3. Severe three-vessel  coronary artery disease.  4. Moderate mitral regurgitation.  5. Pulmonary hypertension secondary to left ventricular dysfunction and  some mitral regurgitation, as well as ischemia.  DISPOSITION:  1. Surgical consultation for possible revascularization surgery.  2. The patient may need a TEE study.   03/2012 Echo LVEF 40%, mild LVH, grade I diastolic dysfunction, mild MR, PASP 25  Assessment and Plan  1.Chronic systolic heart failure - LVEF 16% by echo 03/2012, she is NYHA II, she is euvolemic today in clinic - beta blocker recently stopped, I suspect due to the sinus bradycardia she has had - given her known CAD and systolic dysfunction, in the absence of symptoms with her sinus bradycardia would be of benefit to be on at least low dose beta blocker. - will start Toprol XL at lower dose, start 12.5 mg daily  2. Mitral regurgitation - mild by most recent echo 03/2012 - denies any significant symptoms - continue to follow  3. Hyperlipidemia - will request most recent lipid panel from PCP        Antoine Poche, M.D., F.A.C.C.

## 2013-04-02 NOTE — Patient Instructions (Signed)
Your physician recommends that you schedule a follow-up appointment in: 1 month with Dr. Wyline MoodBranch. This appointment will be scheduled today before you leave.  Your physician has recommended you make the following change in your medication:  Start: Toprol Xl 25 MG take 1/2 tablet by mouth once daily. Start: Zantac 150 MG take 1 tablet by mouth twice daily.   Continue all other medications the same.

## 2013-04-07 ENCOUNTER — Other Ambulatory Visit: Payer: Self-pay | Admitting: *Deleted

## 2013-04-07 MED ORDER — METOPROLOL SUCCINATE ER 25 MG PO TB24: 12.5000 mg | ORAL_TABLET | Freq: Every day | ORAL | Status: DC

## 2013-04-20 ENCOUNTER — Ambulatory Visit: Payer: Medicare Other | Admitting: Cardiology

## 2013-05-01 ENCOUNTER — Ambulatory Visit: Payer: Medicare Other | Admitting: Cardiology

## 2013-05-05 ENCOUNTER — Encounter: Payer: Self-pay | Admitting: Cardiology

## 2013-05-05 ENCOUNTER — Ambulatory Visit (INDEPENDENT_AMBULATORY_CARE_PROVIDER_SITE_OTHER): Payer: Medicare Other | Admitting: Cardiology

## 2013-05-05 VITALS — BP 100/56 | HR 56 | Ht 62.0 in | Wt 221.4 lb

## 2013-05-05 DIAGNOSIS — I10 Essential (primary) hypertension: Secondary | ICD-10-CM

## 2013-05-05 MED ORDER — SPIRONOLACTONE 12.5 MG HALF TABLET: 12.5000 mg | ORAL_TABLET | Freq: Every day | ORAL | Status: DC

## 2013-05-05 NOTE — Progress Notes (Signed)
Clinical Summary [name] [name] is a 73 y.o.female seen today for follow up of the following medical problems.   1. Chronic systolic heart failue/Ischemic cardiomyopathy  - prior CABG 3 vessel in 2002 (SVG-LAD, SVG-OM, SVG-PDA).  - LVEF 40% by echo 03/2012  - no recent chest pain. Can walk approx 1 block prior to getting SOB which is stable. No orthopnea, no PND, + LE edema   - she has had some sinus bradycardia limiting the dosing of her beta blocker, it had previously been stopped. We started Toprol XL 12.5mg  last visit  - since restarting Toprol denies any lightheadedness or dizziness  2. Mitral regurgitation  - mild by most recent echo 03/2012  - no new SOB, occas LE edema   3. Hyperlipidemia  - reports recent lipid panel from pcp that she was told was good.  - compliant with statin       Past Medical History  Diagnosis Date  . Overweight   . Unspecified essential hypertension   . Other and unspecified hyperlipidemia   . Mitral valve insufficiency and aortic valve insufficiency   . Coronary atherosclerosis of artery bypass graft   . 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 Type II or unspecified type diabetes mellitus without mention of complication, not stated as uncontrolled   . Congestive heart failure, unspecified     Ejection fraction 40%. Bedside echocardiogram 2012 ejection fraction 40%.     Allergies  Allergen Reactions  . Sulfonamide Derivatives     REACTION: stomach upset     Current Outpatient Prescriptions  Medication Sig Dispense Refill  . allopurinol (ZYLOPRIM) 300 MG tablet Take 300 mg by mouth daily.        Marland Kitchen aspirin 81 MG tablet Take 81 mg by mouth daily.        . calcium-vitamin D (OSCAL 500/200 D-3) 500-200 MG-UNIT per tablet Take 1 tablet by mouth daily.        . Cholecalciferol (D-3-5) 5000 UNITS capsule Take 5,000 Units by mouth daily.      . furosemide (LASIX) 40 MG tablet Take 60 mg by mouth 2 (two) times daily.         Marland Kitchen HUMALOG MIX 75/25 (75-25) 100 UNIT/ML SUSP injection Inject 34 Units into the skin 2 (two) times daily.      . insulin detemir (LEVEMIR) 100 UNIT/ML injection Inject 24 Units into the skin at bedtime.      Marland Kitchen lisinopril (PRINIVIL,ZESTRIL) 10 MG tablet Take 10 mg by mouth daily.      . metoprolol succinate (TOPROL XL) 25 MG 24 hr tablet Take 0.5 tablets (12.5 mg total) by mouth daily.  45 tablet  3  . Multiple Vitamin (MULTIVITAMIN) tablet Take 1 tablet by mouth daily.        Marland Kitchen omega-3 acid ethyl esters (LOVAZA) 1 G capsule Take 3 g by mouth daily.       . ranitidine (ZANTAC) 150 MG tablet Take 150 mg by mouth 2 (two) times daily.      . simvastatin (ZOCOR) 40 MG tablet Take 40 mg by mouth at bedtime.         No current facility-administered medications for this visit.     Past Surgical History  Procedure Laterality Date  . Coronary artery bypass graft  08/13/2000     [name]     Allergies  Allergen Reactions  . Sulfonamide Derivatives     REACTION: stomach upset  No family history on file.   Social History [name] [name] reports that she quit smoking about 17 years ago. Her smoking use included Cigarettes. She has a 22.5 pack-year smoking history. She has never used smokeless tobacco. [name] [name] reports that she does not drink alcohol.   Review of Systems CONSTITUTIONAL: No weight loss, fever, chills, weakness or fatigue.  HEENT: Eyes: No visual loss, blurred vision, double vision or yellow sclerae.No hearing loss, sneezing, congestion, runny nose or sore throat.  SKIN: No rash or itching.  CARDIOVASCULAR: per HPI RESPIRATORY: No shortness of breath, cough or sputum.  GASTROINTESTINAL: No anorexia, nausea, vomiting or diarrhea. No abdominal pain or blood.  GENITOURINARY: No burning on urination, no polyuria NEUROLOGICAL: No headache, dizziness, syncope, paralysis, ataxia, numbness or tingling in the extremities. No change in bowel or bladder control.   MUSCULOSKELETAL: No muscle, back pain, joint pain or stiffness.  LYMPHATICS: No enlarged nodes. No history of splenectomy.  PSYCHIATRIC: No history of depression or anxiety.  ENDOCRINOLOGIC: No reports of sweating, cold or heat intolerance. No polyuria or polydipsia.  Marland Kitchen.   Physical Examination p 56 bp 100/56 Wt 221 lbs BMI 40 Gen: resting comfortably, no acute distress HEENT: no scleral icterus, pupils equal round and reactive, no palptable cervical adenopathy,  CV: RRR, no m/r/g, no JVD.  Resp: Clear to auscultation bilaterally GI: abdomen is soft, non-tender, non-distended, normal bowel sounds, no hepatosplenomegaly MSK: extremities are warm, 1+ bilateral edema Skin: warm, no rash Neuro:  no focal deficits Psych: appropriate affect   Diagnostic Studies Cath 07/2000  HEMODYNAMIC DATA:  A. 1. Aorta: 161/94/120.  2. LV: 154/23/32.  3. RA: 29.  4. RV: 57/25.  5. Pulmonary artery: 57/35.  6. Pulmonary capillary wedge: 33.  7. No aortic to LV gradient.  B. 1. Cardiac output Fick: 3.5 L/min.  2. Fick cardiac index: 1.67 L/min/sq m.  3. Thermodilution cardiac output: 3.7 L/min.  4. Thermodilution cardiac index: 1.76 L/min/sq m.  C. SATURATIONS:  1. Pulmonary artery: 56.  2. Aorta: 95.  ANGIOGRAPHIC DATA:  1. Ventriculography was performed in the RAO projection. There was  inferobasal hypo- to akinesis. There was at least 2+ and possibly  3+ mitral regurgitation. The inferobasal segment was hypo- to akinetic.  The ejection fraction was calculated at 52%.  2. The left main coronary artery has about 40% to 50% distal narrowing.  3. The left anterior descending artery has a 90% stenosis just beyond the  septal perforator and then diffuse 50% narrowing beyond the large  diagonal. The diagonal itself has about 50% narrowing. There was a  ramus intermedius vessel that has some mild luminal irregularity, but  is free of critical disease.  4. The AV circumflex provides a marginal  branch that is diffusely diseased  with about 70% proximal narrowing and then the vessel fills slowly  distally. There is a retrograde filling of a large circumflex system.  5. The right coronary artery is severely diseased with 40% proximal narrowing  and a subtotal occlusion in the mid vessel. There are multiple lesions of  50% and then 80% to 90% distally. The PDA stops in the mid vessel and  there is competitive filling in the distal vessel. The severity of disease  at this location cannot be ascertained. The posterolateral branch is  large as well.  CONCLUSIONS:  1. Mild reduction of global left ventricular function with an inferobasilar  wall motion abnormality.  2. Patency of the subclavian and internal mammary vessel.  3.  Severe three-vessel coronary artery disease.  4. Moderate mitral regurgitation.  5. Pulmonary hypertension secondary to left ventricular dysfunction and  some mitral regurgitation, as well as ischemia.  DISPOSITION:  1. Surgical consultation for possible revascularization surgery.  2. The patient may need a TEE study.   03/2012 Echo  LVEF 40%, mild LVH, grade I diastolic dysfunction, mild MR, PASP 25     Assessment and Plan   1.Chronic systolic heart failure  - LVEF 40% by echo 03/2012, she is NYHA II, she is euvolemic today in clinic  - medication titration has been limited due to sinus bradycardia and hypotension.  - will try starting spironolactone 12.5mg  daily with BMET in 2 weeks  2. Mitral regurgitation  - mild by most recent echo 03/2012  - denies any significant symptoms  - continue to follow   3. Hyperlipidemia  - will request most recent lipid panel from PCP   F/u 3 months   Antoine Poche, M.D., F.A.C.C.

## 2013-05-05 NOTE — Patient Instructions (Signed)
Your physician recommends that you schedule a follow-up appointment in: 3 months with Dr. Wyline MoodBranch. You should receive a letter in the mail in 1-2 months. If you do not receive this letter by May or June 2015 call our office to schedule this appointment.   Your physician has recommended you make the following change in your medication:  Start: Spironolactone 12.5 MG take 1 tablet by mouth once daily  Continue all other medications the same.   Your physician recommends that you return for lab work in: 2 weeks (around 05-19-13) for BMET  You may go to one of the following locations to have lab work completed: [facility] [address] Dr. Lysbeth GalasNyland [facility].

## 2013-06-02 ENCOUNTER — Telehealth: Payer: Self-pay | Admitting: *Deleted

## 2013-06-02 DIAGNOSIS — N189 Chronic kidney disease, unspecified: Secondary | ICD-10-CM

## 2013-06-02 NOTE — Telephone Encounter (Signed)
Notes Recorded by Lesle ChrisAngela G Hill, LPN on 1/6/10965/05/2013 at 2:04 PM Left message to return call.

## 2013-06-02 NOTE — Telephone Encounter (Signed)
Message copied by Lesle ChrisHILL, ANGELA G on Tue Jun 02, 2013  2:04 PM ------      Message from: GreenfieldBRANCH, JONATHAN F      Created: Mon Jun 01, 2013 10:31 AM       Please let patient know that her renal function has worsened since December. Please have her stop her spioronolactone and decrease lisinopril to 5mg  daily, she needs repeat BMET in 2 weeks.            Dominga FerryJ Branch MD ------

## 2013-06-05 NOTE — Telephone Encounter (Signed)
Notes Recorded by Lesle ChrisAngela G Avaneesh Pepitone, LPN on 1/6/10965/08/2013 at 9:37 AM Left message to return call.

## 2013-06-05 NOTE — Telephone Encounter (Deleted)
Notes Recorded by Johnte Portnoy G Max Romano, LPN on 06/05/2013 at 9:40 AM Patient notified and verbalized understanding. Has follow up scheduled for 06/17/2013 with Dr. Branch.    

## 2013-06-05 NOTE — Telephone Encounter (Signed)
Returned your call.

## 2013-06-09 MED ORDER — LISINOPRIL 10 MG PO TABS: 5.0000 mg | ORAL_TABLET | Freq: Every day | ORAL | Status: DC

## 2013-06-09 NOTE — Telephone Encounter (Signed)
Notes Recorded by Lesle ChrisAngela G Jalaya Sarver, LPN on 1/61/09605/12/2013 at 9:13 AM No answer at home number. Left message to return call on cell number.

## 2013-06-09 NOTE — Telephone Encounter (Signed)
Message copied by Eustace MooreANDERSON, Sharlie Shreffler M on Tue Jun 09, 2013 11:42 AM ------      Message from: [place]AN[place], JONATHAN F      Created: Mon Jun 01, 2013 10:31 AM       Please let patient know that her renal function has worsened since December. Please have her stop her spioronolactone and decrease lisinopril to 5mg  daily, she needs repeat BMET in 2 weeks.            Dominga FerryJ Branch MD ------

## 2013-06-09 NOTE — Telephone Encounter (Signed)
Patient informed and lab orders faxed to [facility] lab. 

## 2013-06-09 NOTE — Telephone Encounter (Signed)
Returned call to [name] [name]  °

## 2013-07-29 ENCOUNTER — Other Ambulatory Visit: Payer: Self-pay | Admitting: *Deleted

## 2013-07-29 DIAGNOSIS — N189 Chronic kidney disease, unspecified: Secondary | ICD-10-CM

## 2013-08-03 ENCOUNTER — Encounter: Payer: Self-pay | Admitting: Cardiology

## 2013-08-03 ENCOUNTER — Ambulatory Visit (INDEPENDENT_AMBULATORY_CARE_PROVIDER_SITE_OTHER): Payer: Medicare Other | Admitting: Cardiology

## 2013-08-03 VITALS — BP 106/66 | HR 69 | Ht 62.0 in | Wt 223.4 lb

## 2013-08-03 DIAGNOSIS — I5022 Chronic systolic (congestive) heart failure: Secondary | ICD-10-CM

## 2013-08-03 DIAGNOSIS — I2510 Atherosclerotic heart disease of native coronary artery without angina pectoris: Secondary | ICD-10-CM

## 2013-08-03 DIAGNOSIS — E785 Hyperlipidemia, unspecified: Secondary | ICD-10-CM

## 2013-08-03 DIAGNOSIS — I340 Nonrheumatic mitral (valve) insufficiency: Secondary | ICD-10-CM

## 2013-08-03 DIAGNOSIS — I059 Rheumatic mitral valve disease, unspecified: Secondary | ICD-10-CM

## 2013-08-03 NOTE — Patient Instructions (Signed)
Continue all current medications. Your physician wants you to follow up in:  1 year.  You will receive a reminder letter in the mail one-two months in advance.  If you don't receive a letter, please call our office to schedule the follow up appointment   

## 2013-08-03 NOTE — Progress Notes (Signed)
Clinical Summary Ms. Kropp is a 73 y.o.female seen today for follow up of the following medical problems.   1. Chronic systolic heart failure/Ischemic cardiomyopathy  - prior CABG 3 vessel in 2002 (SVG-LAD, SVG-OM, SVG-PDA).  - LVEF 40% by echo 03/2012   - no recent chest pain. Can walk approx 1 block prior to getting SOB which is stable. No orthopnea, no PND, + LE edema  - she has had some sinus bradycardia limiting the dosing of her beta blocker.  - tried starting aldactone 12.5mg  daily last visit, her renal function worsened and it was stopped. Repeat panel 2 weeks after showed function had improved.    2. Mitral regurgitation  - mild by most recent echo 03/2012  - no new SOB, occas LE edema   3. Hyperlipidemia  - last panel 03/2013 TC 140 TG 110 HDL 42 LDL 76 - compliant with statin   Past Medical History  Diagnosis Date  . Overweight   . Unspecified essential hypertension   . Other and unspecified hyperlipidemia   . Mitral valve insufficiency and aortic valve insufficiency   . Coronary atherosclerosis of artery bypass graft   . 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 Type II or unspecified type diabetes mellitus without mention of complication, not stated as uncontrolled   . Congestive heart failure, unspecified     Ejection fraction 40%. Bedside echocardiogram 2012 ejection fraction 40%.     Allergies  Allergen Reactions  . Sulfonamide Derivatives     REACTION: stomach upset     Current Outpatient Prescriptions  Medication Sig Dispense Refill  . allopurinol (ZYLOPRIM) 300 MG tablet Take 300 mg by mouth daily.        Marland Kitchen. aspirin 81 MG tablet Take 81 mg by mouth daily.        . calcium-vitamin D (OSCAL 500/200 D-3) 500-200 MG-UNIT per tablet Take 1 tablet by mouth daily.        . Cholecalciferol (D-3-5) 5000 UNITS capsule Take 5,000 Units by mouth daily.      . furosemide (LASIX) 40 MG tablet Take 60 mg by mouth 2 (two) times daily.       Marland Kitchen.  HUMALOG MIX 75/25 (75-25) 100 UNIT/ML SUSP injection Inject 34 Units into the skin 2 (two) times daily.      . insulin detemir (LEVEMIR) 100 UNIT/ML injection Inject 32 Units into the skin at bedtime.       Marland Kitchen. lisinopril (PRINIVIL,ZESTRIL) 10 MG tablet Take 0.5 tablets (5 mg total) by mouth daily.  15 tablet  1  . metoprolol succinate (TOPROL XL) 25 MG 24 hr tablet Take 0.5 tablets (12.5 mg total) by mouth daily.  45 tablet  3  . Multiple Vitamin (MULTIVITAMIN) tablet Take 1 tablet by mouth daily.        Marland Kitchen. omega-3 acid ethyl esters (LOVAZA) 1 G capsule Take 3 g by mouth daily.       . ranitidine (ZANTAC) 150 MG tablet Take 150 mg by mouth 2 (two) times daily.      . simvastatin (ZOCOR) 40 MG tablet Take 40 mg by mouth at bedtime.         No current facility-administered medications for this visit.     Past Surgical History  Procedure Laterality Date  . Coronary artery bypass graft  08/13/2000     Mikey BussingVan, Trigt [place]ii     Allergies  Allergen Reactions  . Sulfonamide Derivatives  REACTION: stomach upset      No family history on file.   Social History Ms. Staron reports that she quit smoking about 17 years ago. Her smoking use included Cigarettes. She has a 22.5 pack-year smoking history. She has never used smokeless tobacco. Ms. Vahle reports that she does not drink alcohol.   Review of Systems CONSTITUTIONAL: No weight loss, fever, chills, weakness or fatigue.  HEENT: Eyes: No visual loss, blurred vision, double vision or yellow sclerae.No hearing loss, sneezing, congestion, runny nose or sore throat.  SKIN: No rash or itching.  CARDIOVASCULAR: per HPI RESPIRATORY: No shortness of breath, cough or sputum.  GASTROINTESTINAL: No anorexia, nausea, vomiting or diarrhea. No abdominal pain or blood.  GENITOURINARY: No burning on urination, no polyuria NEUROLOGICAL: No headache, dizziness, syncope, paralysis, ataxia, numbness or tingling in the extremities. No change in bowel  or bladder control.  MUSCULOSKELETAL: No muscle, back pain, joint pain or stiffness.  LYMPHATICS: No enlarged nodes. No history of splenectomy.  PSYCHIATRIC: No history of depression or anxiety.  ENDOCRINOLOGIC: No reports of sweating, cold or heat intolerance. No polyuria or polydipsia.  Marland Kitchen   Physical Examination p 69 bp 106/66 Wt 223 lbs BMI 41 Gen: resting comfortably, no acute distress HEENT: no scleral icterus, pupils equal round and reactive, no palptable cervical adenopathy,  CV: RRR, no m/r/g, no JVD, no carotid bruits Resp: Clear to auscultation bilaterally GI: abdomen is soft, non-tender, non-distended, normal bowel sounds, no hepatosplenomegaly MSK: extremities are warm, no edema.  Skin: warm, no rash Neuro:  no focal deficits Psych: appropriate affect   Diagnostic Studies Cath 07/2000  HEMODYNAMIC DATA:  A. 1. Aorta: 161/94/120.  2. LV: 154/23/32.  3. RA: 29.  4. RV: 57/25.  5. Pulmonary artery: 57/35.  6. Pulmonary capillary wedge: 33.  7. No aortic to LV gradient.  B. 1. Cardiac output Fick: 3.5 L/min.  2. Fick cardiac index: 1.67 L/min/sq m.  3. Thermodilution cardiac output: 3.7 L/min.  4. Thermodilution cardiac index: 1.76 L/min/sq m.  C. SATURATIONS:  1. Pulmonary artery: 56.  2. Aorta: 95.  ANGIOGRAPHIC DATA:  1. Ventriculography was performed in the RAO projection. There was  inferobasal hypo- to akinesis. There was at least 2+ and possibly  3+ mitral regurgitation. The inferobasal segment was hypo- to akinetic.  The ejection fraction was calculated at 52%.  2. The left main coronary artery has about 40% to 50% distal narrowing.  3. The left anterior descending artery has a 90% stenosis just beyond the  septal perforator and then diffuse 50% narrowing beyond the large  diagonal. The diagonal itself has about 50% narrowing. There was a  ramus intermedius vessel that has some mild luminal irregularity, but  is free of critical disease.  4. The AV  circumflex provides a marginal Lexianna Weinrich that is diffusely diseased  with about 70% proximal narrowing and then the vessel fills slowly  distally. There is a retrograde filling of a large circumflex system.  5. The right coronary artery is severely diseased with 40% proximal narrowing  and a subtotal occlusion in the mid vessel. There are multiple lesions of  50% and then 80% to 90% distally. The PDA stops in the mid vessel and  there is competitive filling in the distal vessel. The severity of disease  at this location cannot be ascertained. The posterolateral Heather Mckendree is  large as well.  CONCLUSIONS:  1. Mild reduction of global left ventricular function with an inferobasilar  wall motion abnormality.  2.  Patency of the subclavian and internal mammary vessel.  3. Severe three-vessel coronary artery disease.  4. Moderate mitral regurgitation.  5. Pulmonary hypertension secondary to left ventricular dysfunction and  some mitral regurgitation, as well as ischemia.  DISPOSITION:  1. Surgical consultation for possible revascularization surgery.  2. The patient may need a TEE study.   03/2012 Echo  LVEF 40%, mild LVH, grade I diastolic dysfunction, mild MR, PASP 25     Assessment and Plan  1.Chronic systolic heart failure/Ischemic CM - LVEF 40% by echo 03/2012, she is NYHA II, she is euvolemic today in clinic  - medication titration has been limited due to sinus bradycardia and hypotension. Attempt at starting aldactone led to worsening of renal function that resolved after the medication was stopped - continue current mesd  2. Mitral regurgitation  - mild by most recent echo 03/2012  - denies any significant symptoms  - continue to follow   3. Hyperlipidemia  - at goal, continue current statin       Antoine PocheJonathan F. Yahir Tavano, M.D., F.A.C.C.

## 2014-07-30 ENCOUNTER — Encounter: Payer: Self-pay | Admitting: *Deleted

## 2014-08-04 ENCOUNTER — Encounter: Payer: Self-pay | Admitting: Cardiology

## 2014-08-04 ENCOUNTER — Ambulatory Visit (INDEPENDENT_AMBULATORY_CARE_PROVIDER_SITE_OTHER): Payer: Self-pay | Admitting: Cardiology

## 2014-08-04 VITALS — BP 89/57 | HR 62 | Ht 62.0 in | Wt 229.0 lb

## 2014-08-04 DIAGNOSIS — I10 Essential (primary) hypertension: Secondary | ICD-10-CM

## 2014-08-04 DIAGNOSIS — R0602 Shortness of breath: Secondary | ICD-10-CM

## 2014-08-04 MED ORDER — ATORVASTATIN CALCIUM 40 MG PO TABS: 40.0000 mg | ORAL_TABLET | Freq: Every day | ORAL | Status: DC

## 2014-08-04 NOTE — Patient Instructions (Signed)
   Stop Simvastatin.  Begin Atorvastatin  daily - new sent to local pharmacy today.   Continue all other medications.   Your physician has requested that you have an echocardiogram. Echocardiography is a painless test that uses sound waves to create images of your heart. It provides your doctor with information about the size and shape of your heart and how well your heart's chambers and valves are working. This procedure takes approximately one hour. There are no restrictions for this procedure. Office will contact with results via phone or letter.   Follow up in  3 months

## 2014-08-04 NOTE — Progress Notes (Signed)
Clinical Summary Ms. Waybright is a 74 y.o.female seen today for follow up of the following medical problems.   1. Chronic systolic heart failure/Ischemic cardiomyopathy  - prior CABG 3 vessel in 2002 (SVG-LAD, SVG-OM, SVG-PDA).  - LVEF 40% by echo 03/2012  - she has had some sinus bradycardia limiting the dosing of her beta blocker. - tried starting aldactone 12.5mg  daily last visit, her renal function worsened and it was stopped. Repeat panel 2 weeks after showed function had improved.   - DOE somewhat worst since last visit, now at 1/2 block. + LE edema. + orthopnea. No PND - compliant with meds - weights at home daily, typically around 225-228 lbs.    2. Mitral regurgitation  - mild by most recent echo 03/2012  - increased DOE as described above  3. Hyperlipidemia  - compliant with statin     Past Medical History  Diagnosis Date  . Overweight(278.02)   . Unspecified essential hypertension   . Other and unspecified hyperlipidemia   . Mitral valve insufficiency and aortic valve insufficiency   . Coronary atherosclerosis of artery bypass graft   . 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 Type II or unspecified type diabetes mellitus without mention of complication, not stated as uncontrolled   . Congestive heart failure, unspecified     Ejection fraction 40%. Bedside echocardiogram 2012 ejection fraction 40%.     Allergies  Allergen Reactions  . Sulfonamide Derivatives     REACTION: stomach upset     Current Outpatient Prescriptions  Medication Sig Dispense Refill  . allopurinol (ZYLOPRIM) 300 MG tablet Take 300 mg by mouth daily.      Marland Kitchen aspirin 81 MG tablet Take 81 mg by mouth daily.      . calcium-vitamin D (OSCAL 500/200 D-3) 500-200 MG-UNIT per tablet Take 1 tablet by mouth daily.      . Cholecalciferol (D-3-5) 5000 UNITS capsule Take 5,000 Units by mouth daily.    . furosemide (LASIX) 40 MG tablet Take 60 mg by mouth 2 (two) times  daily.     Marland Kitchen HUMALOG MIX 75/25 (75-25) 100 UNIT/ML SUSP injection Inject 34 Units into the skin 2 (two) times daily.    . insulin detemir (LEVEMIR) 100 UNIT/ML injection Inject 32 Units into the skin at bedtime.     Marland Kitchen lisinopril (PRINIVIL,ZESTRIL) 10 MG tablet Take 10 mg by mouth daily.    . metoprolol succinate (TOPROL XL) 25 MG 24 hr tablet Take 0.5 tablets (12.5 mg total) by mouth daily. 45 tablet 3  . Multiple Vitamin (MULTIVITAMIN) tablet Take 1 tablet by mouth daily.      . Omega-3 Fatty Acids (FISH OIL) 1200 MG CAPS Take 2 tabs by mouth every morning & 1 tab every evening    . ranitidine (ZANTAC) 150 MG tablet Take 150 mg by mouth 2 (two) times daily.    . simvastatin (ZOCOR) 40 MG tablet Take 40 mg by mouth at bedtime.       No current facility-administered medications for this visit.     Past Surgical History  Procedure Laterality Date  . Coronary artery bypass graft  08/13/2000     [name]     Allergies  Allergen Reactions  . Sulfonamide Derivatives     REACTION: stomach upset      Family History  Problem Relation Age of Onset  . Hypertension       Social History Ms. Rappleye reports  that she quit smoking about 18 years ago. Her smoking use included Cigarettes. She has a 22.5 pack-year smoking history. She has never used smokeless tobacco. Ms. Raimondo reports that she does not drink alcohol.   Review of Systems CONSTITUTIONAL: No weight loss, fever, chills, weakness or fatigue.  HEENT: Eyes: No visual loss, blurred vision, double vision or yellow sclerae.No hearing loss, sneezing, congestion, runny nose or sore throat.  SKIN: No rash or itching.  CARDIOVASCULAR: per HPI RESPIRATORY: No shortness of breath, cough or sputum.  GASTROINTESTINAL: No anorexia, nausea, vomiting or diarrhea. No abdominal pain or blood.  GENITOURINARY: No burning on urination, no polyuria NEUROLOGICAL: No headache, dizziness, syncope, paralysis, ataxia, numbness or tingling  in the extremities. No change in bowel or bladder control.  MUSCULOSKELETAL: No muscle, back pain, joint pain or stiffness.  LYMPHATICS: No enlarged nodes. No history of splenectomy.  PSYCHIATRIC: No history of depression or anxiety.  ENDOCRINOLOGIC: No reports of sweating, cold or heat intolerance. No polyuria or polydipsia.  Marland Kitchen.   Physical Examination Filed Vitals:   08/04/14 1049  BP: 89/57  Pulse: 62   Filed Vitals:   08/04/14 1033  Height: 5\' 2"  (1.575 m)  Weight: 229 lb (103.874 kg)    Gen: resting comfortably, no acute distress HEENT: no scleral icterus, pupils equal round and reactive, no palptable cervical adenopathy,  CV: RRR, no m/r/g, no JVD Resp: Clear to auscultation bilaterally GI: abdomen is soft, non-tender, non-distended, normal bowel sounds, no hepatosplenomegaly MSK: extremities are warm, no edema.  Skin: warm, no rash Neuro:  no focal deficits Psych: appropriate affect   Diagnostic Studies Cath 07/2000  HEMODYNAMIC DATA:  A. 1. Aorta: 161/94/120.  2. LV: 154/23/32.  3. RA: 29.  4. RV: 57/25.  5. Pulmonary artery: 57/35.  6. Pulmonary capillary wedge: 33.  7. No aortic to LV gradient.  B. 1. Cardiac output Fick: 3.5 L/min.  2. Fick cardiac index: 1.67 L/min/sq m.  3. Thermodilution cardiac output: 3.7 L/min.  4. Thermodilution cardiac index: 1.76 L/min/sq m.  C. SATURATIONS:  1. Pulmonary artery: 56.  2. Aorta: 95.  ANGIOGRAPHIC DATA:  1. Ventriculography was performed in the RAO projection. There was  inferobasal hypo- to akinesis. There was at least 2+ and possibly  3+ mitral regurgitation. The inferobasal segment was hypo- to akinetic.  The ejection fraction was calculated at 52%.  2. The left main coronary artery has about 40% to 50% distal narrowing.  3. The left anterior descending artery has a 90% stenosis just beyond the  septal perforator and then diffuse 50% narrowing beyond the large  diagonal. The diagonal  itself has about 50% narrowing. There was a  ramus intermedius vessel that has some mild luminal irregularity, but  is free of critical disease.  4. The AV circumflex provides a marginal Erva Koke that is diffusely diseased  with about 70% proximal narrowing and then the vessel fills slowly  distally. There is a retrograde filling of a large circumflex system.  5. The right coronary artery is severely diseased with 40% proximal narrowing  and a subtotal occlusion in the mid vessel. There are multiple lesions of  50% and then 80% to 90% distally. The PDA stops in the mid vessel and  there is competitive filling in the distal vessel. The severity of disease  at this location cannot be ascertained. The posterolateral Alva Kuenzel is  large as well.  CONCLUSIONS:  1. Mild reduction of global left ventricular function with an inferobasilar  wall motion  abnormality.  2. Patency of the subclavian and internal mammary vessel.  3. Severe three-vessel coronary artery disease.  4. Moderate mitral regurgitation.  5. Pulmonary hypertension secondary to left ventricular dysfunction and  some mitral regurgitation, as well as ischemia.  DISPOSITION:  1. Surgical consultation for possible revascularization surgery.  2. The patient may need a TEE study.   03/2012 Echo  LVEF 40%, mild LVH, grade I diastolic dysfunction, mild MR, PASP 25     Assessment and Plan  1.Chronic systolic heart failure/Ischemic CM - LVEF 40% by echo 03/2012, she is NYHA III, she is euvolemic today in clinic  - medication titration has been limited due to sinus bradycardia and hypotension. Attempt at starting aldactone led to worsening of renal function that resolved after the medication was stopped - notes some increased DOE over the last few months, will repeat echo   2. Mitral regurgitation  - mild by most recent echo 03/2012  - note some increased DOE over the last few months, will repeat echo  3.  Hyperlipidemia  - given her history of DM2 will change to atorva  daily.    F/u 3 months  Antoine Poche, M.D.

## 2014-08-26 ENCOUNTER — Ambulatory Visit (INDEPENDENT_AMBULATORY_CARE_PROVIDER_SITE_OTHER): Payer: Medicare Other

## 2014-08-26 DIAGNOSIS — R0602 Shortness of breath: Secondary | ICD-10-CM | POA: Diagnosis not present

## 2014-08-27 ENCOUNTER — Telehealth: Payer: Self-pay | Admitting: *Deleted

## 2014-08-27 NOTE — Telephone Encounter (Signed)
Pt aware, routed to pcp 

## 2014-08-27 NOTE — Telephone Encounter (Signed)
-----   Message from Antoine Poche, MD sent at 08/27/2014  9:29 AM EDT ----- Echo shows heart function is stable from prior echo, the pumping function remains mildly weak  Dominga Ferry MD

## 2014-09-03 ENCOUNTER — Telehealth: Payer: Self-pay | Admitting: Cardiology

## 2014-09-03 NOTE — Telephone Encounter (Signed)
On DOS 08/04/14, the following was documented:  DISPOSITION:  1. Surgical consultation for possible revascularization surgery.  2. The patient may need a TEE study.    [name] [name] was calling to understand what this means and if we are going to move forward. Please call to verify our documentation and get some additional information that was presented during the patient's visit at their office today.

## 2014-09-06 NOTE — Telephone Encounter (Signed)
DOS 08/14/14 was for heart cath done in 2002 ,pt does not need TEE,just had simple echo, and odes not need revascularization I faxed office note and recent echo to Dr Elroy Channel in [place] fax [phone]

## 2014-09-08 ENCOUNTER — Encounter: Payer: Self-pay | Admitting: Physician Assistant

## 2014-09-08 NOTE — Progress Notes (Addendum)
Cardiology Office Note Date:  09/09/2014  Patient ID:  [name] [name], DOB 08/29/1940, MRN [id number] PCP:  PROVIDER NOT IN SYSTEM  Cardiologist:  Branch   Chief Complaint: chest pain  History of Present Illness: [name] [name] is a 74 y.o. female with history of CAD s/p 3V CABG 2002 (SVG-LAD, SVG-OM, SVG-PDA), chronic systolic CHF/ICM, sinus bradycardia limiting BB titration, mild MR by echo 2014, HLD, obesity, DM who presents for evaluation of chest pain. Med titration for CHF limited by sinus bradycardia and hypotension. She also has h/o worsening renal function while on aldactone that resolved after this was stopped. At last OV 08/04/14 she was reporting dyspnea. 2D echo was obtained 08/26/14 which was technically difficult, EF 40%, grade 1 DD, some RWMAs, trivial AI, mild-mod MR, mild PR, overall felt to be stable. Zocor was changed to Lipitor . No recent labs available in Epic.  There was some confusion about what led up to today's office visit. In Dr. Verna Czech recent very thorough office note, he included the cath report from 2002 which included the line about the recommendation at that time for "Surgical consultation for possible revascularization surgery." When primary care was reviewing the note, they thought this was a recent recommendation and the patient was awaiting surgery. They referred her back here to be seen ASAP to discuss further. The patient and the daughters were very upset as they thought they were not being being kept in the loop about her care. Unfortunately when PCP got a copy of the note, the date of the recent office visit was time-stamped right in the middle of the cath report at a page break, accounting for the situation. In the original note by Dr. Wyline Mood, this is not present so it was a matter of how each party was viewing the note.  The family was very reassured when I explained what had happened.  However, the patient does endorse intermittent chest discomfort  for the last year. It happens 2-3x a week with variability in when it comes on. Sometimes it happens when she "rushes in a hurry" but other times it happens when she is laying in bed doing nothing at all. The pain eases off both with further rest and taking Zantac. She does not have SL NTG. She has difficulty with stairs due to dyspnea but is in general not very active. BP is on the softer side today - 90/52 with repeat 102/58 by me. Per report her lisinopril was recently increased to  daily and Lasix to  BID by primary care. She still has edema that comes and goes.   Past Medical History  Diagnosis Date  . Obesity   . Essential hypertension   . Hyperlipidemia   . Mitral valve insufficiency and aortic valve insufficiency     a. Mild-mod MR by echo 07/2014.  Marland Kitchen CAD (coronary artery disease)     a. s/p 3V CABG 2002 (SVG-LAD, SVG-OM, SVG-PDA).  . Diabetes mellitus   . Chronic systolic CHF (congestive heart failure)     a. EF 40%.  . Ischemic cardiomyopathy   . Sinus bradycardia   . Hypotension     Past Surgical History  Procedure Laterality Date  . Coronary artery bypass graft  08/13/2000     [name]    Current Outpatient Prescriptions  Medication Sig Dispense Refill  . aspirin 81 MG tablet Take 81 mg by mouth daily.      Marland Kitchen atorvastatin (LIPITOR) 40 MG tablet Take 1  tablet (40 mg total) by mouth daily. 30 tablet 6  . calcium-vitamin D (OSCAL 500/200 D-3) 500-200 MG-UNIT per tablet Take 1 tablet by mouth daily.      . Cholecalciferol (D-3-5) 5000 UNITS capsule Take 5,000 Units by mouth daily.    . furosemide (LASIX) 40 MG tablet Take 80 mg by mouth 2 (two) times daily.    Marland Kitchen HUMALOG MIX 75/25 (75-25) 100 UNIT/ML SUSP injection Inject 34 Units into the skin 2 (two) times daily.    Marland Kitchen linagliptin (TRADJENTA) 5 MG TABS tablet Take 5 mg by mouth daily.    Marland Kitchen lisinopril (PRINIVIL,ZESTRIL) 10 MG tablet Take 10 mg by mouth daily. Takes 20 mg daily per pcp pt states dr Adriana Simas      . metoprolol succinate (TOPROL XL) 25 MG 24 hr tablet Take 0.5 tablets (12.5 mg total) by mouth daily. 45 tablet 3  . Omega-3 Fatty Acids (FISH OIL) 1200 MG CAPS Take 2 tabs by mouth every morning & 1 tab every evening    . ranitidine (ZANTAC) 150 MG tablet Take 150 mg by mouth 2 (two) times daily.    Marland Kitchen ULORIC 40 MG tablet Take 1 tablet by mouth daily.     No current facility-administered medications for this visit.    Allergies:   Spironolactone and Sulfonamide derivatives   Social History:  The patient  reports that she quit smoking about 18 years ago. Her smoking use included Cigarettes. She started smoking about 33 years ago. She has a 22.5 pack-year smoking history. She has never used smokeless tobacco. She reports that she does not drink alcohol or use illicit drugs.   Family History:  The patient's family history includes Hypertension in an other family member.  ROS:  Please see the history of present illness.  All other systems are reviewed and otherwise negative.   PHYSICAL EXAM:  VS:  BP 90/52 mmHg  Pulse 66  Ht 5\' 2"  (1.575 m)  Wt 225 lb (102.059 kg)  BMI 41.14 kg/m2  SpO2 99% BMI: Body mass index is 41.14 kg/(m^2). Well nourished, well developed morbidly obese AAF in no acute distress HEENT: normocephalic, atraumatic Neck: no JVD, carotid bruits or masses Cardiac:  normal S1, S2; RRR; no murmurs, rubs, or gallops Lungs:  clear to auscultation bilaterally, no wheezing, rhonchi or rales Abd: soft, nontender, no hepatomegaly, + BS MS: no deformity or atrophy Ext: trace bilateral LE edema bilaterally Skin: warm and dry, no rash Neuro:  moves all extremities spontaneously, no focal abnormalities noted, follows commands Psych: euthymic mood, full affect   EKG:  Done today shows NSR 65bpm low voltage QRS, TWI I, avL, prior anterolateral infarct. No change from prior.  Recent Labs: No results found for requested labs within last 365 days.  No results found for requested  labs within last 365 days.   CrCl cannot be calculated (Patient has no serum creatinine result on file.).   Wt Readings from Last 3 Encounters:  09/09/14 225 lb (102.059 kg)  08/04/14 229 lb (103.874 kg)  08/03/13 223 lb 6.4 oz (101.334 kg)     Other studies reviewed: Additional studies/records reviewed today include: summarized above  ASSESSMENT AND PLAN:  1. CAD with recent chest pain - symptoms have both typical and atypical features. EKG unchanged from prior. I do feel she needs updated ischemic testing. I discussed options of stress testing versus cardiac catheterization (including risks/benefits/alternatives of both) and I think either is reasonable. She wants to discuss with family and will  let us know what she decides. Check baseline labs to exclude other causes. Will rx SL NTG PRN. Warning signs reviewed.  2. Chronic systolic heart failure/ICM EF 16% - blood pressure is a little soft. Will reduce lisinopril back to 10mg  daily. Continue current dose of Lasix as long as labs are OK. I also think some of her edema may be due to venous insufficiency and obesity. We discussed salt/fluid restriction as well as compression hose and elevation of legs. 3. Mitral regurgitation - mild-mod by recent echo, will follow clinically.  4. Hyperlipidemia - check CMET today. Statin recently titrated by Dr. Wyline Mood. Will defer timing of f/u lipids to him. 5. Morbid obesity - long term I think she would significantly benefit from weight loss. We discussed healthy lifestyle changes, as well as regular physical activity once cleared from a cardiac perspective.  Disposition: F/u with Dr. Wyline Mood in 1 month, sooner if needed.  Current medicines are reviewed at length with the patient today.  The patient did not have any concerns regarding medicines.  Signed, Ronie Spies PA-C 09/09/2014 2:57 PM     [facility] [place] Location [address] [place], [place] [address] [phone]

## 2014-09-09 ENCOUNTER — Ambulatory Visit (INDEPENDENT_AMBULATORY_CARE_PROVIDER_SITE_OTHER): Payer: Medicare Other | Admitting: Physician Assistant

## 2014-09-09 ENCOUNTER — Encounter: Payer: Self-pay | Admitting: Physician Assistant

## 2014-09-09 VITALS — BP 90/52 | HR 66 | Ht 62.0 in | Wt 225.0 lb

## 2014-09-09 DIAGNOSIS — I5022 Chronic systolic (congestive) heart failure: Secondary | ICD-10-CM

## 2014-09-09 DIAGNOSIS — I255 Ischemic cardiomyopathy: Secondary | ICD-10-CM | POA: Diagnosis not present

## 2014-09-09 DIAGNOSIS — I340 Nonrheumatic mitral (valve) insufficiency: Secondary | ICD-10-CM

## 2014-09-09 DIAGNOSIS — E785 Hyperlipidemia, unspecified: Secondary | ICD-10-CM

## 2014-09-09 DIAGNOSIS — I25119 Atherosclerotic heart disease of native coronary artery with unspecified angina pectoris: Secondary | ICD-10-CM

## 2014-09-09 MED ORDER — NITROGLYCERIN 0.4 MG SL SUBL: 0.4000 mg | SUBLINGUAL_TABLET | SUBLINGUAL | Status: DC | PRN

## 2014-09-09 MED ORDER — LISINOPRIL 10 MG PO TABS: 10.0000 mg | ORAL_TABLET | Freq: Every day | ORAL | Status: DC

## 2014-09-09 NOTE — Patient Instructions (Signed)
Your physician recommends that you schedule a follow-up appointment in: 1 month with Dr Wyline Mood  Use Nitroglycerine as directed for chest pain   DECREASE Lisinopril to 10 mg daily   Get lab work: [facility] office with decision if you want to proceed with heart cath or stress test      Thank you for choosing [facility] !

## 2014-09-10 LAB — PROTIME-INR
INR: 1.06 (ref <1.50)
Prothrombin Time: 13.8 seconds (ref 11.6–15.2)

## 2014-09-10 LAB — COMPREHENSIVE METABOLIC PANEL
ALBUMIN: 3.8 g/dL (ref 3.6–5.1)
ALK PHOS: 76 U/L (ref 33–130)
ALT: 12 U/L (ref 6–29)
AST: 11 U/L (ref 10–35)
BILIRUBIN TOTAL: 0.5 mg/dL (ref 0.2–1.2)
BUN: 36 mg/dL — ABNORMAL HIGH (ref 7–25)
CO2: 27 mmol/L (ref 20–31)
Calcium: 9.1 mg/dL (ref 8.6–10.4)
Chloride: 96 mmol/L — ABNORMAL LOW (ref 98–110)
Creat: 2.14 mg/dL — ABNORMAL HIGH (ref 0.60–0.93)
Glucose, Bld: 200 mg/dL — ABNORMAL HIGH (ref 65–99)
POTASSIUM: 4.4 mmol/L (ref 3.5–5.3)
Sodium: 133 mmol/L — ABNORMAL LOW (ref 135–146)
TOTAL PROTEIN: 6.6 g/dL (ref 6.1–8.1)

## 2014-09-10 LAB — CBC
HCT: 33.9 % — ABNORMAL LOW (ref 36.0–46.0)
Hemoglobin: 11.6 g/dL — ABNORMAL LOW (ref 12.0–15.0)
MCH: 30.8 pg (ref 26.0–34.0)
MCHC: 34.2 g/dL (ref 30.0–36.0)
MCV: 89.9 fL (ref 78.0–100.0)
MPV: 11.4 fL (ref 8.6–12.4)
PLATELETS: 152 10*3/uL (ref 150–400)
RBC: 3.77 MIL/uL — AB (ref 3.87–5.11)
RDW: 13.4 % (ref 11.5–15.5)
WBC: 4.1 10*3/uL (ref 4.0–10.5)

## 2014-09-13 ENCOUNTER — Telehealth: Payer: Self-pay | Admitting: Adult Health

## 2014-09-13 ENCOUNTER — Telehealth: Payer: Self-pay | Admitting: *Deleted

## 2014-09-13 DIAGNOSIS — R072 Precordial pain: Secondary | ICD-10-CM

## 2014-09-13 DIAGNOSIS — Z79899 Other long term (current) drug therapy: Secondary | ICD-10-CM

## 2014-09-13 NOTE — Telephone Encounter (Signed)
Patient calling back to advise which "procedure" she wanted to do. Also would like results of blood work. / tg

## 2014-09-13 NOTE — Telephone Encounter (Signed)
-----   Message from Laurann Montana, [place] sent at 09/13/2014  3:11 PM EDT ----- Please see result note below - sent to [facility]. [facility] but didn't realize she is not here today. Thank you! Dayna Dunn PA-C

## 2014-09-14 ENCOUNTER — Encounter: Payer: Self-pay | Admitting: Physician Assistant

## 2014-09-14 ENCOUNTER — Ambulatory Visit (INDEPENDENT_AMBULATORY_CARE_PROVIDER_SITE_OTHER): Payer: Medicare Other

## 2014-09-14 VITALS — BP 102/68 | HR 65 | Ht 62.0 in

## 2014-09-14 DIAGNOSIS — Z136 Encounter for screening for cardiovascular disorders: Secondary | ICD-10-CM | POA: Diagnosis not present

## 2014-09-14 DIAGNOSIS — Z0130 Encounter for examination of blood pressure without abnormal findings: Secondary | ICD-10-CM

## 2014-09-14 NOTE — Patient Instructions (Signed)
We will call you with any medication changes.please get your lab work done today

## 2014-09-14 NOTE — Progress Notes (Signed)
Returns for BP check,pt without complaints,lab slip given to pt

## 2014-09-15 ENCOUNTER — Encounter: Payer: Self-pay | Admitting: *Deleted

## 2014-09-15 ENCOUNTER — Telehealth: Payer: Self-pay | Admitting: Adult Health

## 2014-09-15 LAB — BASIC METABOLIC PANEL
BUN: 43 mg/dL — ABNORMAL HIGH (ref 7–25)
CALCIUM: 9.3 mg/dL (ref 8.6–10.4)
CO2: 28 mmol/L (ref 20–31)
Chloride: 96 mmol/L — ABNORMAL LOW (ref 98–110)
Creat: 1.96 mg/dL — ABNORMAL HIGH (ref 0.60–0.93)
GLUCOSE: 203 mg/dL — AB (ref 65–99)
Potassium: 4.8 mmol/L (ref 3.5–5.3)
SODIUM: 135 mmol/L (ref 135–146)

## 2014-09-15 MED ORDER — FUROSEMIDE 40 MG PO TABS: ORAL_TABLET | ORAL | Status: DC

## 2014-09-15 NOTE — Telephone Encounter (Deleted)
-----   Message from Jonathan F Branch, MD sent at 09/15/2014 10:56 AM EDT ----- Thank you for seeing this patient for me Dayna, sorry you had to deal with all that confusion regarding my progress note.. I agree with you that her change in Cr likely due to recent increase in lisionpril and lasix, mild trend back down after decreasing her lisinopril.  I would cut back her lasix as well to 80mg in AM and 40mg in PM. Given her borderline renal function I would get a lexiscan instead of cath to further risk stratify her, if high risk could consider cath with minimal dye.   Kisha please change lasix to 80mg in AM and 40mg in PM. Please order a lexiscan MPI. Please explain to patient kidney function is improving but still not good enough to consider cath and that a lexiscan would be her best option to evaluate her chest pain. She does not need to hold any meds. If any questions or concerns can add them on my schedule for Friday to discuss in further detail.   J Branch MD 

## 2014-09-15 NOTE — Telephone Encounter (Signed)
Patient notified of med changes and of Lexi scan and appt date and time. Orders placed. Patient voiced understanding. Letter mailed to patient.

## 2014-09-15 NOTE — Telephone Encounter (Deleted)
-----   Message from Antoine Poche, MD sent at 09/15/2014 10:56 AM EDT ----- Thank you for seeing this patient for me Dayna, sorry you had to deal with all that confusion regarding my progress note.. I agree with you that her change in Cr likely due to recent increase in lisionpril and lasix, mild trend back down after decreasing her lisinopril.  I would cut back her lasix as well to  in AM and  in PM. Given her borderline renal function I would get a lexiscan instead of cath to further risk stratify her, if high risk could consider cath with minimal dye.   Kisha please change lasix to  in AM and  in PM. Please order a lexiscan MPI. Please explain to patient kidney function is improving but still not good enough to consider cath and that a lexiscan would be her best option to evaluate her chest pain. She does not need to hold any meds. If any questions or concerns can add them on my schedule for Friday to discuss in further detail.   Dominga Ferry MD

## 2014-09-15 NOTE — Telephone Encounter (Signed)
Spoke with pt

## 2014-09-15 NOTE — Telephone Encounter (Signed)
Pls call pt concerning her BP, she is returning the call she got yesterday afternoon

## 2014-09-22 ENCOUNTER — Encounter (HOSPITAL_COMMUNITY)
Admission: RE | Admit: 2014-09-22 | Discharge: 2014-09-22 | Disposition: A | Discharge status: Home / Self Care | Payer: Medicare Other | Source: Ambulatory Visit | Attending: Cardiology | Admitting: Cardiology

## 2014-09-22 ENCOUNTER — Encounter (HOSPITAL_COMMUNITY): Payer: Self-pay

## 2014-09-22 ENCOUNTER — Inpatient Hospital Stay (HOSPITAL_COMMUNITY): Admission: RE | Admit: 2014-09-22 | Payer: Medicare Other | Source: Ambulatory Visit

## 2014-09-22 DIAGNOSIS — I5189 Other ill-defined heart diseases: Secondary | ICD-10-CM | POA: Diagnosis not present

## 2014-09-22 DIAGNOSIS — Q218 Other congenital malformations of cardiac septa: Secondary | ICD-10-CM | POA: Insufficient documentation

## 2014-09-22 DIAGNOSIS — R072 Precordial pain: Secondary | ICD-10-CM

## 2014-09-22 DIAGNOSIS — R079 Chest pain, unspecified: Secondary | ICD-10-CM | POA: Diagnosis present

## 2014-09-22 LAB — NM MYOCAR MULTI W/SPECT W/WALL MOTION / EF
CHL CUP NUCLEAR SRS: 11
CHL CUP RESTING HR STRESS: 56 {beats}/min
LV dias vol: 141 mL
LV sys vol: 112 mL
NUC STRESS TID: 1.33
Peak HR: 71 {beats}/min
RATE: 0
SDS: 9
SSS: 20

## 2014-09-22 MED ORDER — TECHNETIUM TC 99M SESTAMIBI GENERIC - CARDIOLITE
10.0000 | Freq: Once | INTRAVENOUS | Status: AC | PRN
  Administered 2014-09-22: 10 via INTRAVENOUS

## 2014-09-22 MED ORDER — SODIUM CHLORIDE 0.9 % IJ SOLN
INTRAMUSCULAR | Status: AC
  Administered 2014-09-22: 10 mL via INTRAVENOUS
  Filled 2014-09-22: qty 3

## 2014-09-22 MED ORDER — TECHNETIUM TC 99M SESTAMIBI - CARDIOLITE
30.0000 | Freq: Once | INTRAVENOUS | Status: AC | PRN
  Administered 2014-09-22: 30 via INTRAVENOUS

## 2014-09-22 MED ORDER — REGADENOSON 0.4 MG/5ML IV SOLN
INTRAVENOUS | Status: AC
  Administered 2014-09-22: 0.4 mg via INTRAVENOUS
  Filled 2014-09-22: qty 5

## 2014-09-23 ENCOUNTER — Telehealth: Payer: Self-pay

## 2014-09-23 NOTE — Telephone Encounter (Signed)
-----   Message from Antoine Poche, MD sent at 09/23/2014  1:12 PM EDT ----- Stress test shows mainly evidence of old blockages, there is potentially a small area of new blockage that may be causing some of her symptoms. Its small enough that I would focus first on treating with medications, especially given the risk to her kidneys with a possible cath. Have her see me in 1-2 weeks, can put in any slot to discuss further.   Dominga Ferry MD

## 2014-09-23 NOTE — Telephone Encounter (Signed)
Spoke with daughter,apt moved to 9/9 at 8:50 am

## 2014-09-24 ENCOUNTER — Telehealth: Payer: Self-pay | Admitting: Cardiology

## 2014-09-24 NOTE — Telephone Encounter (Signed)
Results of stress test / can leave message on machine / tg

## 2014-09-27 NOTE — Telephone Encounter (Signed)
Pt aware of results, confirmed appt for 9/9

## 2014-10-08 ENCOUNTER — Ambulatory Visit (INDEPENDENT_AMBULATORY_CARE_PROVIDER_SITE_OTHER): Payer: Medicare Other | Admitting: Cardiology

## 2014-10-08 ENCOUNTER — Encounter: Payer: Self-pay | Admitting: Cardiology

## 2014-10-08 VITALS — BP 103/68 | HR 60 | Ht 62.0 in | Wt 226.0 lb

## 2014-10-08 DIAGNOSIS — I10 Essential (primary) hypertension: Secondary | ICD-10-CM

## 2014-10-08 MED ORDER — RANOLAZINE ER 500 MG PO TB12: 500.0000 mg | ORAL_TABLET | Freq: Two times a day (BID) | ORAL | Status: DC

## 2014-10-08 NOTE — Progress Notes (Signed)
Patient ID: [name] [name], female   DOB: 1940-03-05, 74 y.o.   MRN: [id number]     Clinical Summary [name] is a 74 y.o.female seen today for follow up of the following medical problems. This is a focused visit on her her history of chest pain.   1. Chronic systolic heart failure/Ischemic cardiomyopathy/CAD   - prior CABG 3 vessel in 2002 (SVG-LAD, SVG-OM, SVG-PDA).  - LVEF 40% by echo 03/2012  - she has had some sinus bradycardia limiting the dosing of her beta blocker. Aldactone caused AKI.    - last visit described episodes of chest pain. Was sent for MPI which showed large inferior scar, anterior defect with some reversibility. Reported LVEF 20% indicating high risk study, however recent echo 07/2014 LVEF 40%.   - still having chest pain at time. Occurs daily. Can occur at rest or with exertion. Pressure/burning pain across entire chest, 7/10. Feels nauseous. Not positional. No relation to food, but mainly occurs with laying down. DOE walking around home.    Past Medical History  Diagnosis Date  . Obesity   . Essential hypertension   . Hyperlipidemia   . Mitral valve insufficiency and aortic valve insufficiency     a. Mild-mod MR by echo 07/2014.  Marland Kitchen CAD (coronary artery disease)     a. s/p 3V CABG 2002 (SVG-LAD, SVG-OM, SVG-PDA).  . Diabetes mellitus   . Chronic systolic CHF (congestive heart failure)     a. EF 40%.  . Ischemic cardiomyopathy   . Sinus bradycardia   . Hypotension      Allergies  Allergen Reactions  . Spironolactone     Not allergic but had worsening of renal function while on this.  . Sulfonamide Derivatives     REACTION: stomach upset     Current Outpatient Prescriptions  Medication Sig Dispense Refill  . aspirin 81 MG tablet Take 81 mg by mouth daily.      Marland Kitchen atorvastatin (LIPITOR) 40 MG tablet Take 1 tablet (40 mg total) by mouth daily. 30 tablet 6  . calcium-vitamin D (OSCAL 500/200 D-3) 500-200 MG-UNIT per tablet Take 1 tablet by mouth  daily.      . Cholecalciferol (D-3-5) 5000 UNITS capsule Take 5,000 Units by mouth daily.    . furosemide (LASIX) 40 MG tablet TAKE 2 TABLETS ( 80 MG ) IN THE MORNING AND 1 TABLET ( 40 MG ) IN THE EVENING 90 tablet 6  . HUMALOG MIX 75/25 (75-25) 100 UNIT/ML SUSP injection Inject 34 Units into the skin 2 (two) times daily.    Marland Kitchen linagliptin (TRADJENTA) 5 MG TABS tablet Take 5 mg by mouth daily.    Marland Kitchen lisinopril (PRINIVIL,ZESTRIL) 10 MG tablet Take 1 tablet (10 mg total) by mouth daily. 90 tablet 3  . metoprolol succinate (TOPROL XL) 25 MG 24 hr tablet Take 0.5 tablets (12.5 mg total) by mouth daily. 45 tablet 3  . nitroGLYCERIN (NITROSTAT) 0.4 MG SL tablet Place 1 tablet (0.4 mg total) under the tongue every 5 (five) minutes as needed. 25 tablet 3  . Omega-3 Fatty Acids (FISH OIL) 1200 MG CAPS Take 2 tabs by mouth every morning & 1 tab every evening    . ranitidine (ZANTAC) 150 MG tablet Take 150 mg by mouth 2 (two) times daily.    Marland Kitchen ULORIC 40 MG tablet Take 1 tablet by mouth daily.     No current facility-administered medications for this visit.     Past Surgical History  Procedure Laterality  Date  . Coronary artery bypass graft  08/13/2000     [name]     Allergies  Allergen Reactions  . Spironolactone     Not allergic but had worsening of renal function while on this.  . Sulfonamide Derivatives     REACTION: stomach upset      Family History  Problem Relation Age of Onset  . Hypertension       Social History [name] reports that she quit smoking about 18 years ago. Her smoking use included Cigarettes. She started smoking about 33 years ago. She has a 22.5 pack-year smoking history. She has never used smokeless tobacco. [name] reports that she does not drink alcohol.   Review of Systems CONSTITUTIONAL: No weight loss, fever, chills, weakness or fatigue.  HEENT: Eyes: No visual loss, blurred vision, double vision or yellow sclerae.No hearing loss,  sneezing, congestion, runny nose or sore throat.  SKIN: No rash or itching.  CARDIOVASCULAR: per HPI RESPIRATORY: No cough or sputum.  GASTROINTESTINAL: No anorexia, nausea, vomiting or diarrhea. No abdominal pain or blood.  GENITOURINARY: No burning on urination, no polyuria NEUROLOGICAL: No headache, dizziness, syncope, paralysis, ataxia, numbness or tingling in the extremities. No change in bowel or bladder control.  MUSCULOSKELETAL: No muscle, back pain, joint pain or stiffness.  LYMPHATICS: No enlarged nodes. No history of splenectomy.  PSYCHIATRIC: No history of depression or anxiety.  ENDOCRINOLOGIC: No reports of sweating, cold or heat intolerance. No polyuria or polydipsia.  Marland Kitchen   Physical Examination Filed Vitals:   10/08/14 0901  BP: 103/68  Pulse: 60   Filed Vitals:   10/08/14 0901  Height:  (1.575 m)  Weight: 226 lb (102.513 kg)    Gen: resting comfortably, no acute distress HEENT: no scleral icterus, pupils equal round and reactive, no palptable cervical adenopathy,  CV: RRR, no m/r/g, no jvd, no carotid bruits Resp: Clear to auscultation bilaterally GI: abdomen is soft, non-tender, non-distended, normal bowel sounds, no hepatosplenomegaly MSK: extremities are warm, no edema.  Skin: warm, no rash Neuro:  no focal deficits Psych: appropriate affect   Diagnostic Studies Cath 07/2000  HEMODYNAMIC DATA:  A. 1. Aorta: 161/94/120.  2. LV: 154/23/32.  3. RA: 29.  4. RV: 57/25.  5. Pulmonary artery: 57/35.  6. Pulmonary capillary wedge: 33.  7. No aortic to LV gradient.  B. 1. Cardiac output Fick: 3.5 L/min.  2. Fick cardiac index: 1.67 L/min/sq m.  3. Thermodilution cardiac output: 3.7 L/min.  4. Thermodilution cardiac index: 1.76 L/min/sq m.  C. SATURATIONS:  1. Pulmonary artery: 56.  2. Aorta: 95.  ANGIOGRAPHIC DATA:  1. Ventriculography was performed in the RAO projection. There was  inferobasal hypo- to akinesis. There was at  least 2+ and possibly  3+ mitral regurgitation. The inferobasal segment was hypo- to akinetic.  The ejection fraction was calculated at 52%.  2. The left main coronary artery has about 40% to 50% distal narrowing.  3. The left anterior descending artery has a 90% stenosis just beyond the  septal perforator and then diffuse 50% narrowing beyond the large  diagonal. The diagonal itself has about 50% narrowing. There was a  ramus intermedius vessel that has some mild luminal irregularity, but  is free of critical disease.  4. The AV circumflex provides a marginal Dwon Sky that is diffusely diseased  with about 70% proximal narrowing and then the vessel fills slowly  distally. There is a retrograde filling of a large circumflex system.  5. The right coronary artery is severely diseased with 40% proximal narrowing  and a subtotal occlusion in the mid vessel. There are multiple lesions of  50% and then 80% to 90% distally. The PDA stops in the mid vessel and  there is competitive filling in the distal vessel. The severity of disease  at this location cannot be ascertained. The posterolateral Lasonya Hubner is  large as well.  CONCLUSIONS:  1. Mild reduction of global left ventricular function with an inferobasilar  wall motion abnormality.  2. Patency of the subclavian and internal mammary vessel.  3. Severe three-vessel coronary artery disease.  4. Moderate mitral regurgitation.  5. Pulmonary hypertension secondary to left ventricular dysfunction and  some mitral regurgitation, as well as ischemia.    03/2012 Echo  LVEF 40%, mild LVH, grade I diastolic dysfunction, mild MR, PASP 25  07/2014 echo Study Conclusions  - Procedure narrative: Transthoracic echocardiography for left ventricular function evaluation, for right ventricular function evaluation, and for assessment of valvular function. Image quality was poor. The study was technically difficult, as  a result of poor acoustic windows and body habitus. - Left ventricle: The cavity size was normal. Wall thickness was normal. Systolic function was moderately reduced, estimated LVEF 40%. Doppler parameters are consistent with abnormal left ventricular relaxation (grade 1 diastolic dysfunction). Doppler parameters are consistent with high ventricular filling pressure. - Regional wall motion abnormality: Hypokinesis of the basal-mid anteroseptal, mid inferior, and basal-mid inferolateral myocardium; moderate hypokinesis of the mid anterior myocardium. - Aortic valve: Poorly visualized. Mildly thickened leaflets. There was trivial regurgitation. - Mitral valve: There was mild to moderate eccentric regurgitation. - Right ventricle: Systolic function was reduced. Lateral annulus peak S velocity: 7.57 cm/s. - Pulmonic valve: There was mild regurgitation.  08/2014 Stress MPI  No diagnostic ST segment changes noted.  Overall poor quality images with significant extracardiac gut uptake and limited cardiac counts.  Defect 1: There is a medium defect of mild severity present in the mid anteroseptal, mid anterolateral, apical anterior and apical septal location. The defect is partially reversible. Consistent with combination of scar and mild to moderate peri-infarct ischemia.  Defect 2: There is a large defect of severe severity present in the basal inferior, basal inferolateral, mid inferior, mid inferolateral and apical inferior location. The defect is non-reversible. Consistent with scar.  This is a high risk study.  Nuclear stress EF: 20%. Consistent with ischemic cardiomyopathy    Assessment and Plan  1.Chronic systolic heart failure/Ischemic CM/CAD - reports some SOB and chest pain symptoms. Echo with overall stable mildly reduced LVEF, MPI with inferior scar but also anterior defect with some reversibility. Poor renal function prohibits cath, will continue medical  management. Will start ranexa 500mg  bid (soft bp and heart rates limit other antianginals).    F/u 1 month. Repeat BMET and Mg since cutting down her lasix.    Antoine Poche, M.D.

## 2014-10-08 NOTE — Patient Instructions (Signed)
Your physician recommends that you schedule a follow-up appointment in: 1 MONTH WITH DR. BRANCH  Your physician has recommended you make the following change in your medication:   START RANEXA 500 MG TWICE DAILY  Your physician recommends that you return for lab work BMP/MG  Thank you for choosing [facility]!!

## 2014-10-18 ENCOUNTER — Telehealth: Payer: Self-pay | Admitting: Cardiology

## 2014-10-18 NOTE — Telephone Encounter (Signed)
Last seen by Dr. Wyline Mood on 10/08/14.  Started her Ranexa on 10/12/2014.  Stated the symptoms of cough & wheeze began on Friday, 9/16.  No fever, runny nose or any other symptoms.  Has not saw her family doctor yet.  Advised that most common symptom with this medication is dizziness.  Will forward to Dr. Wyline Mood for further advice.

## 2014-10-18 NOTE — Telephone Encounter (Signed)
Mrs. Jon states that she started coughing and wheezing since she started taking the ranolazine (RANEXA) 500 MG 12 hr tablet

## 2014-10-19 MED ORDER — LOSARTAN POTASSIUM 25 MG PO TABS: 25.0000 mg | ORAL_TABLET | Freq: Every day | ORAL | Status: DC

## 2014-10-19 NOTE — Telephone Encounter (Signed)
Can stop lisinopril and start losartan  daily and follow symptoms   Dominga Ferry MD

## 2014-10-19 NOTE — Telephone Encounter (Signed)
Patient informed and verbalized understanding of plan. 

## 2014-10-19 NOTE — Telephone Encounter (Signed)
Patient is concerned about the cough she has and wants to know if any of her medications could be causing this. Nurse advised patient that lisinopril can cause a horrible cough. Patient said she has already taken lisinopril today. Nurse advised patient that MD would be notified of her concerns.

## 2014-10-22 ENCOUNTER — Encounter: Payer: Self-pay | Admitting: *Deleted

## 2014-10-22 ENCOUNTER — Ambulatory Visit: Payer: Medicare Other | Admitting: Cardiology

## 2014-11-05 ENCOUNTER — Ambulatory Visit (INDEPENDENT_AMBULATORY_CARE_PROVIDER_SITE_OTHER): Payer: Medicare Other | Admitting: Cardiology

## 2014-11-05 ENCOUNTER — Encounter: Payer: Self-pay | Admitting: Cardiology

## 2014-11-05 VITALS — BP 120/64 | HR 61 | Ht 62.0 in | Wt 220.0 lb

## 2014-11-05 DIAGNOSIS — I25119 Atherosclerotic heart disease of native coronary artery with unspecified angina pectoris: Secondary | ICD-10-CM | POA: Diagnosis not present

## 2014-11-05 DIAGNOSIS — I5022 Chronic systolic (congestive) heart failure: Secondary | ICD-10-CM | POA: Diagnosis not present

## 2014-11-05 NOTE — Patient Instructions (Signed)
Your physician wants you to follow-up in: 4 MONTHS WITH DR BRANCH You will receive a reminder letter in the mail two months in advance. If you don't receive a letter, please call our office to schedule the follow-up appointment.  Your physician recommends that you continue on your current medications as directed. Please refer to the Current Medication list given to you today.  Thank you for choosing [facility]!!   

## 2014-11-05 NOTE — Progress Notes (Signed)
Patient ID: [name] [name], female   DOB: 1940-04-21, 74 y.o.   MRN: [id number]     Clinical Summary [name] [name] is a 74 y.o.female seen today for follow up of the following medical problems. This is a focused visit on her her history of chest pain.   1. Chronic systolic heart failure/Ischemic cardiomyopathy/CAD  - prior CABG 3 vessel in 2002 (SVG-LAD, SVG-OM, SVG-PDA).  - LVEF 40% by echo 03/2012  - she has had some sinus bradycardia limiting the dosing of her beta blocker. Aldactone caused AKI.  - MPI 08/2014 showed large inferior scar, anterior defect with some reversibility. Reported LVEF 20% indicating high risk study, however recent echo 07/2014 LVEF 40%.  - last visit started ranexa  bid.  - since last visit denies any chest pain, no SOB.      Past Medical History  Diagnosis Date  . Obesity   . Essential hypertension   . Hyperlipidemia   . Mitral valve insufficiency and aortic valve insufficiency     a. Mild-mod MR by echo 07/2014.  Marland Kitchen CAD (coronary artery disease)     a. s/p 3V CABG 2002 (SVG-LAD, SVG-OM, SVG-PDA).  . Diabetes mellitus   . Chronic systolic CHF (congestive heart failure)     a. EF 40%.  . Ischemic cardiomyopathy   . Sinus bradycardia   . Hypotension      Allergies  Allergen Reactions  . Spironolactone     Not allergic but had worsening of renal function while on this.  . Sulfonamide Derivatives     REACTION: stomach upset     Current Outpatient Prescriptions  Medication Sig Dispense Refill  . aspirin 81 MG tablet Take 81 mg by mouth daily.      Marland Kitchen atorvastatin (LIPITOR) 40 MG tablet Take 1 tablet (40 mg total) by mouth daily. 30 tablet 6  . calcium-vitamin D (OSCAL 500/200 D-3) 500-200 MG-UNIT per tablet Take 1 tablet by mouth daily.      . Cholecalciferol (D-3-5) 5000 UNITS capsule Take 5,000 Units by mouth daily.    . furosemide (LASIX) 40 MG tablet TAKE 2 TABLETS ( 80 MG ) IN THE MORNING AND 1 TABLET ( 40 MG ) IN THE EVENING 90 tablet  6  . HUMALOG MIX 75/25 (75-25) 100 UNIT/ML SUSP injection Inject 32 Units into the skin 2 (two) times daily.     Marland Kitchen linagliptin (TRADJENTA) 5 MG TABS tablet Take 5 mg by mouth daily.    Marland Kitchen losartan (COZAAR) 25 MG tablet Take 1 tablet (25 mg total) by mouth daily. 90 tablet 3  . metoprolol succinate (TOPROL XL) 25 MG 24 hr tablet Take 0.5 tablets (12.5 mg total) by mouth daily. 45 tablet 3  . nitroGLYCERIN (NITROSTAT) 0.4 MG SL tablet Place 1 tablet (0.4 mg total) under the tongue every 5 (five) minutes as needed. 25 tablet 3  . Omega-3 Fatty Acids (FISH OIL) 1200 MG CAPS Take 2 tabs by mouth every morning & 1 tab every evening    . ranitidine (ZANTAC) 150 MG tablet Take 150 mg by mouth 2 (two) times daily.    . ranolazine (RANEXA) 500 MG 12 hr tablet Take 1 tablet (500 mg total) by mouth 2 (two) times daily. 60 tablet 3  . ULORIC 40 MG tablet Take 1 tablet by mouth daily.     No current facility-administered medications for this visit.     Past Surgical History  Procedure Laterality Date  . Coronary artery bypass graft  08/13/2000  [name]     Allergies  Allergen Reactions  . Spironolactone     Not allergic but had worsening of renal function while on this.  . Sulfonamide Derivatives     REACTION: stomach upset      Family History  Problem Relation Age of Onset  . Hypertension       Social History [name] [name] reports that she quit smoking about 18 years ago. Her smoking use included Cigarettes. She started smoking about 33 years ago. She has a 22.5 pack-year smoking history. She has never used smokeless tobacco. [name] [name] reports that she does not drink alcohol.   Review of Systems CONSTITUTIONAL: No weight loss, fever, chills, weakness or fatigue.  HEENT: Eyes: No visual loss, blurred vision, double vision or yellow sclerae.No hearing loss, sneezing, congestion, runny nose or sore throat.  SKIN: No rash or itching.  CARDIOVASCULAR: per hpi RESPIRATORY: No  shortness of breath, cough or sputum.  GASTROINTESTINAL: No anorexia, nausea, vomiting or diarrhea. No abdominal pain or blood.  GENITOURINARY: No burning on urination, no polyuria NEUROLOGICAL: No headache, dizziness, syncope, paralysis, ataxia, numbness or tingling in the extremities. No change in bowel or bladder control.  MUSCULOSKELETAL: No muscle, back pain, joint pain or stiffness.  LYMPHATICS: No enlarged nodes. No history of splenectomy.  PSYCHIATRIC: No history of depression or anxiety.  ENDOCRINOLOGIC: No reports of sweating, cold or heat intolerance. No polyuria or polydipsia.  Marland Kitchen   Physical Examination Filed Vitals:   11/05/14 1106  BP: 120/64  Pulse: 61   Filed Vitals:   11/05/14 1106  Height:  (1.575 m)  Weight: 220 lb (99.791 kg)    Gen: resting comfortably, no acute distress HEENT: no scleral icterus, pupils equal round and reactive, no palptable cervical adenopathy,  CV; rrr, no m/r/g, no jvd Resp: Clear to auscultation bilaterally GI: abdomen is soft, non-tender, non-distended, normal bowel sounds, no hepatosplenomegaly MSK: extremities are warm, no edema.  Skin: warm, no rash Neuro:  no focal deficits Psych: appropriate affect   Diagnostic Studies Cath 07/2000  HEMODYNAMIC DATA:  A. 1. Aorta: 161/94/120.  2. LV: 154/23/32.  3. RA: 29.  4. RV: 57/25.  5. Pulmonary artery: 57/35.  6. Pulmonary capillary wedge: 33.  7. No aortic to LV gradient.  B. 1. Cardiac output Fick: 3.5 L/min.  2. Fick cardiac index: 1.67 L/min/sq m.  3. Thermodilution cardiac output: 3.7 L/min.  4. Thermodilution cardiac index: 1.76 L/min/sq m.  C. SATURATIONS:  1. Pulmonary artery: 56.  2. Aorta: 95.  ANGIOGRAPHIC DATA:  1. Ventriculography was performed in the RAO projection. There was  inferobasal hypo- to akinesis. There was at least 2+ and possibly  3+ mitral regurgitation. The inferobasal segment was hypo- to akinetic.  The ejection fraction  was calculated at 52%.  2. The left main coronary artery has about 40% to 50% distal narrowing.  3. The left anterior descending artery has a 90% stenosis just beyond the  septal perforator and then diffuse 50% narrowing beyond the large  diagonal. The diagonal itself has about 50% narrowing. There was a  ramus intermedius vessel that has some mild luminal irregularity, but  is free of critical disease.  4. The AV circumflex provides a marginal Madden Garron that is diffusely diseased  with about 70% proximal narrowing and then the vessel fills slowly  distally. There is a retrograde filling of a large circumflex system.  5. The right coronary artery is severely diseased with 40% proximal narrowing  and a subtotal occlusion in the mid vessel. There are multiple lesions of  50% and then 80% to 90% distally. The PDA stops in the mid vessel and  there is competitive filling in the distal vessel. The severity of disease  at this location cannot be ascertained. The posterolateral Carolene Gitto is  large as well.  CONCLUSIONS:  1. Mild reduction of global left ventricular function with an inferobasilar  wall motion abnormality.  2. Patency of the subclavian and internal mammary vessel.  3. Severe three-vessel coronary artery disease.  4. Moderate mitral regurgitation.  5. Pulmonary hypertension secondary to left ventricular dysfunction and  some mitral regurgitation, as well as ischemia.    03/2012 Echo  LVEF 40%, mild LVH, grade I diastolic dysfunction, mild MR, PASP 25  07/2014 echo Study Conclusions  - Procedure narrative: Transthoracic echocardiography for left ventricular function evaluation, for right ventricular function evaluation, and for assessment of valvular function. Image quality was poor. The study was technically difficult, as a result of poor acoustic windows and body habitus. - Left ventricle: The cavity size was normal. Wall thickness was normal.  Systolic function was moderately reduced, estimated LVEF 40%. Doppler parameters are consistent with abnormal left ventricular relaxation (grade 1 diastolic dysfunction). Doppler parameters are consistent with high ventricular filling pressure. - Regional wall motion abnormality: Hypokinesis of the basal-mid anteroseptal, mid inferior, and basal-mid inferolateral myocardium; moderate hypokinesis of the mid anterior myocardium. - Aortic valve: Poorly visualized. Mildly thickened leaflets. There was trivial regurgitation. - Mitral valve: There was mild to moderate eccentric regurgitation. - Right ventricle: Systolic function was reduced. Lateral annulus peak S velocity: 7.57 cm/s. - Pulmonic valve: There was mild regurgitation.  08/2014 Stress MPI  No diagnostic ST segment changes noted.  Overall poor quality images with significant extracardiac gut uptake and limited cardiac counts.  Defect 1: There is a medium defect of mild severity present in the mid anteroseptal, mid anterolateral, apical anterior and apical septal location. The defect is partially reversible. Consistent with combination of scar and mild to moderate peri-infarct ischemia.  Defect 2: There is a large defect of severe severity present in the basal inferior, basal inferolateral, mid inferior, mid inferolateral and apical inferior location. The defect is non-reversible. Consistent with scar.  This is a high risk study.  Nuclear stress EF: 20%. Consistent with ischemic cardiomyopathy    Assessment and Plan  1.Chronic systolic heart failure/Ischemic CM/CAD - reports some SOB and chest pain symptoms. Echo with overall stable mildly reduced LVEF, MPI with inferior scar but also anterior defect with some reversibility. Poor renal function prohibits cath, will continue medical management. Symptoms improved since starting ranexa, continue to follow clinically  F/u 4 months      Antoine Poche,  M.D.

## 2014-11-15 ENCOUNTER — Telehealth: Payer: Self-pay | Admitting: *Deleted

## 2014-11-15 NOTE — Telephone Encounter (Signed)
-----   Message from Antoine PocheJonathan F Branch, MD sent at 11/12/2014  5:00 PM EDT ----- Labs look good. Kidney function improved since last check  Dominga FerryJ Branch MD

## 2014-11-15 NOTE — Telephone Encounter (Signed)
Pt aware, routed to pcp 

## 2014-11-26 ENCOUNTER — Telehealth: Payer: Self-pay | Admitting: Cardiology

## 2014-11-26 NOTE — Telephone Encounter (Signed)
Returned call to patient.  Stated that she has been constipated off/on x 1 month.  Thinks the Ranexa is causing this & wheezing.  Stated that she has been taking some castor oil & that will make her go.  Also, suggested Colace or Miralax OTC.  Will send message to provider for further advice.

## 2014-11-26 NOTE — Telephone Encounter (Signed)
[name] [name] states that the medicine Ranolazine is causing her constipation & wheezing

## 2014-11-29 NOTE — Telephone Encounter (Signed)
Ranexa can cause constipation in up to 5% of people. She has done well on it and I'd like to try to keep her on it if possible. I would try taking metamucil (a fiber supplement sold at all drug stores) daily with increased water intake to see if that can help keep her regular. This is a natural laxative and won't cause the problems sometimes longterm use of other laxatives can. If this does not help then we will have to stop the ranexa  J Tonni Mansour MD

## 2014-11-29 NOTE — Telephone Encounter (Signed)
Left message to return call 

## 2014-12-01 NOTE — Telephone Encounter (Signed)
Attempted to return call again.  Left information below on voice mail.   

## 2014-12-02 NOTE — Telephone Encounter (Signed)
Patient returned call.  Received & understood information below.  Does not need a return call.

## 2015-02-15 ENCOUNTER — Other Ambulatory Visit: Payer: Self-pay | Admitting: Cardiology

## 2015-03-08 ENCOUNTER — Other Ambulatory Visit: Payer: Self-pay | Admitting: Cardiology

## 2015-03-11 ENCOUNTER — Other Ambulatory Visit: Payer: Self-pay | Admitting: *Deleted

## 2015-03-11 MED ORDER — LOSARTAN POTASSIUM 25 MG PO TABS: 25.0000 mg | ORAL_TABLET | Freq: Every day | ORAL | Status: DC

## 2015-03-11 MED ORDER — ATORVASTATIN CALCIUM 40 MG PO TABS: 40.0000 mg | ORAL_TABLET | Freq: Every day | ORAL | Status: DC

## 2015-03-11 MED ORDER — RANITIDINE HCL 150 MG PO TABS: 150.0000 mg | ORAL_TABLET | Freq: Two times a day (BID) | ORAL | Status: DC

## 2015-03-11 MED ORDER — RANOLAZINE ER 500 MG PO TB12: 500.0000 mg | ORAL_TABLET | Freq: Two times a day (BID) | ORAL | Status: DC

## 2015-03-18 ENCOUNTER — Other Ambulatory Visit: Payer: Self-pay | Admitting: Cardiology

## 2015-03-29 ENCOUNTER — Other Ambulatory Visit: Payer: Self-pay | Admitting: Cardiology

## 2015-04-05 ENCOUNTER — Other Ambulatory Visit: Payer: Self-pay | Admitting: Cardiology

## 2015-06-15 ENCOUNTER — Ambulatory Visit (INDEPENDENT_AMBULATORY_CARE_PROVIDER_SITE_OTHER): Payer: Medicare Other | Admitting: Cardiology

## 2015-06-15 ENCOUNTER — Encounter: Payer: Self-pay | Admitting: Cardiology

## 2015-06-15 VITALS — BP 116/66 | HR 73 | Ht 62.0 in | Wt 222.0 lb

## 2015-06-15 DIAGNOSIS — R5383 Other fatigue: Secondary | ICD-10-CM | POA: Diagnosis not present

## 2015-06-15 DIAGNOSIS — I5022 Chronic systolic (congestive) heart failure: Secondary | ICD-10-CM | POA: Diagnosis not present

## 2015-06-15 DIAGNOSIS — Z79899 Other long term (current) drug therapy: Secondary | ICD-10-CM | POA: Diagnosis not present

## 2015-06-15 DIAGNOSIS — I2510 Atherosclerotic heart disease of native coronary artery without angina pectoris: Secondary | ICD-10-CM | POA: Diagnosis not present

## 2015-06-15 NOTE — Patient Instructions (Signed)
Medication Instructions:  FOR NOW HOLD THESE MEDICATIONS: LASIX RANEXA LOSARTAN SPIRONOLACTONE  Labwork: Your physician recommends that you return for lab work in: Monday 5/22 CMET CBC MAGNESIUM   Testing/Procedures: NONE   Follow-Up: Your physician recommends that you schedule a follow-up appointment in: 2 WEEKS    Any Other Special Instructions Will Be Listed Below (If Applicable).     If you need a refill on your cardiac medications before your next appointment, please call your pharmacy.

## 2015-06-15 NOTE — Progress Notes (Addendum)
Patient ID: [name] [name], female   DOB: [birth year], 75 y.o.   MRN: [id number]     Clinical Summary [name] [name] is a 75 y.o.female seen today for follow up of the following medical problems.   1. Chronic systolic heart failure/Ischemic cardiomyopathy/CAD - prior CABG 3 vessel in 2002 (SVG-LAD, SVG-OM, SVG-PDA).  - LVEF 40% by echo 03/2012  - she has had some sinus bradycardia limiting the dosing of her beta blocker. Aldactone caused AKI.  - MPI 08/2014 showed large inferior scar, anterior defect with some reversibility. Reported LVEF 20% indicating high risk study, however recent echo 07/2014 LVEF 40%.    - recent ER visit with generalized fatigue. Low energy x 1 week. Has had some nausea, poor appetite. Mild SOB. No sinus congestion, no sore throat. No fevers or chills. ER records are pending, from discussion with ER doctor she had AKI with elevated Cr and BUN. Lasix, losartan, aldactone we held.  - today energy is starting to increase, nausea is resolving.     Past Medical History  Diagnosis Date  . Obesity   . Essential hypertension   . Hyperlipidemia   . Mitral valve insufficiency and aortic valve insufficiency     a. Mild-mod MR by echo 07/2014.  Marland Kitchen CAD (coronary artery disease)     a. s/p 3V CABG 2002 (SVG-LAD, SVG-OM, SVG-PDA).  . Diabetes mellitus (HCC)   . Chronic systolic CHF (congestive heart failure) (HCC)     a. EF 40%.  . Ischemic cardiomyopathy   . Sinus bradycardia   . Hypotension      Allergies  Allergen Reactions  . Spironolactone     Not allergic but had worsening of renal function while on this.  . Sulfonamide Derivatives     REACTION: stomach upset     Current Outpatient Prescriptions  Medication Sig Dispense Refill  . aspirin 81 MG tablet Take 81 mg by mouth daily.      Marland Kitchen atorvastatin (LIPITOR) 40 MG tablet Take 1 tablet by mouth  daily 90 tablet 3  . calcium-vitamin D (OSCAL 500/200 D-3) 500-200 MG-UNIT per tablet Take 1 tablet by mouth  daily.      . Cholecalciferol (D-3-5) 5000 UNITS capsule Take 5,000 Units by mouth daily.    . furosemide (LASIX) 40 MG tablet TAKE 2 TABLETS ( 80 MG ) IN THE MORNING AND 1 TABLET ( 40 MG ) IN THE EVENING 90 tablet 6  . HUMALOG MIX 75/25 (75-25) 100 UNIT/ML SUSP injection Inject 32 Units into the skin 2 (two) times daily.     Marland Kitchen linagliptin (TRADJENTA) 5 MG TABS tablet Take 5 mg by mouth daily.    Marland Kitchen losartan (COZAAR) 25 MG tablet Take 1 tablet by mouth  daily 15 tablet 0  . metoprolol succinate (TOPROL XL) 25 MG 24 hr tablet Take 0.5 tablets (12.5 mg total) by mouth daily. 45 tablet 3  . nitroGLYCERIN (NITROSTAT) 0.4 MG SL tablet Place 1 tablet (0.4 mg total) under the tongue every 5 (five) minutes as needed. 25 tablet 3  . Omega-3 Fatty Acids (FISH OIL) 1200 MG CAPS Take 2 tabs by mouth every morning & 1 tab every evening    . RANEXA 500 MG 12 hr tablet Take 1 tablet by mouth two  times daily 180 tablet 3  . ranitidine (ZANTAC) 150 MG tablet Take 1 tablet by mouth two  times daily 180 tablet 3  . ULORIC 40 MG tablet Take 1 tablet by mouth daily.  No current facility-administered medications for this visit.     Past Surgical History  Procedure Laterality Date  . Coronary artery bypass graft  08/13/2000     [name]     Allergies  Allergen Reactions  . Spironolactone     Not allergic but had worsening of renal function while on this.  . Sulfonamide Derivatives     REACTION: stomach upset      Family History  Problem Relation Age of Onset  . Hypertension       Social History [name] [name] reports that she quit smoking about 19 years ago. Her smoking use included Cigarettes. She started smoking about 33 years ago. She has a 22.5 pack-year smoking history. She has never used smokeless tobacco. [name] [name] reports that she does not drink alcohol.   Review of Systems CONSTITUTIONAL: No weight loss, fever, chills, weakness or fatigue.  HEENT: Eyes: No visual loss,  blurred vision, double vision or yellow sclerae.No hearing loss, sneezing, congestion, runny nose or sore throat.  SKIN: No rash or itching.  CARDIOVASCULAR: per HPI RESPIRATORY: per HPI GASTROINTESTINAL: No anorexia, nausea, vomiting or diarrhea. No abdominal pain or blood.  GENITOURINARY: No burning on urination, no polyuria NEUROLOGICAL: No headache, dizziness, syncope, paralysis, ataxia, numbness or tingling in the extremities. No change in bowel or bladder control.  MUSCULOSKELETAL: No muscle, back pain, joint pain or stiffness.  LYMPHATICS: No enlarged nodes. No history of splenectomy.  PSYCHIATRIC: No history of depression or anxiety.  ENDOCRINOLOGIC: No reports of sweating, cold or heat intolerance. No polyuria or polydipsia.  Marland Kitchen   Physical Examination Filed Vitals:   06/15/15 1509  Pulse: 73   Filed Weights   06/15/15 1509  Weight: 222 lb (100.699 kg)    Gen: resting comfortably, no acute distress HEENT: no scleral icterus, pupils equal round and reactive, no palptable cervical adenopathy,  CV: RRR, no m/r/g, no jvd Resp: Clear to auscultation bilaterally GI: abdomen is soft, non-tender, non-distended, normal bowel sounds, no hepatosplenomegaly MSK: extremities are warm, 1+ bialteral LE edema Skin: warm, no rash Neuro:  no focal deficits Psych: appropriate affect   Diagnostic Studies Cath 07/2000  HEMODYNAMIC DATA:  A. 1. Aorta: 161/94/120.  2. LV: 154/23/32.  3. RA: 29.  4. RV: 57/25.  5. Pulmonary artery: 57/35.  6. Pulmonary capillary wedge: 33.  7. No aortic to LV gradient.  B. 1. Cardiac output Fick: 3.5 L/min.  2. Fick cardiac index: 1.67 L/min/sq m.  3. Thermodilution cardiac output: 3.7 L/min.  4. Thermodilution cardiac index: 1.76 L/min/sq m.  C. SATURATIONS:  1. Pulmonary artery: 56.  2. Aorta: 95.  ANGIOGRAPHIC DATA:  1. Ventriculography was performed in the RAO projection. There was  inferobasal hypo- to akinesis. There was at  least 2+ and possibly  3+ mitral regurgitation. The inferobasal segment was hypo- to akinetic.  The ejection fraction was calculated at 52%.  2. The left main coronary artery has about 40% to 50% distal narrowing.  3. The left anterior descending artery has a 90% stenosis just beyond the  septal perforator and then diffuse 50% narrowing beyond the large  diagonal. The diagonal itself has about 50% narrowing. There was a  ramus intermedius vessel that has some mild luminal irregularity, but  is free of critical disease.  4. The AV circumflex provides a marginal Adiyah Lame that is diffusely diseased  with about 70% proximal narrowing and then the vessel fills slowly  distally. There is a retrograde filling of a  large circumflex system.  5. The right coronary artery is severely diseased with 40% proximal narrowing  and a subtotal occlusion in the mid vessel. There are multiple lesions of  50% and then 80% to 90% distally. The PDA stops in the mid vessel and  there is competitive filling in the distal vessel. The severity of disease  at this location cannot be ascertained. The posterolateral Aneya Daddona is  large as well.  CONCLUSIONS:  1. Mild reduction of global left ventricular function with an inferobasilar  wall motion abnormality.  2. Patency of the subclavian and internal mammary vessel.  3. Severe three-vessel coronary artery disease.  4. Moderate mitral regurgitation.  5. Pulmonary hypertension secondary to left ventricular dysfunction and  some mitral regurgitation, as well as ischemia.    03/2012 Echo  LVEF 40%, mild LVH, grade I diastolic dysfunction, mild MR, PASP 25  07/2014 echo Study Conclusions  - Procedure narrative: Transthoracic echocardiography for left ventricular function evaluation, for right ventricular function evaluation, and for assessment of valvular function. Image quality was poor. The study was technically difficult, as  a result of poor acoustic windows and body habitus. - Left ventricle: The cavity size was normal. Wall thickness was normal. Systolic function was moderately reduced, estimated LVEF 40%. Doppler parameters are consistent with abnormal left ventricular relaxation (grade 1 diastolic dysfunction). Doppler parameters are consistent with high ventricular filling pressure. - Regional wall motion abnormality: Hypokinesis of the basal-mid anteroseptal, mid inferior, and basal-mid inferolateral myocardium; moderate hypokinesis of the mid anterior myocardium. - Aortic valve: Poorly visualized. Mildly thickened leaflets. There was trivial regurgitation. - Mitral valve: There was mild to moderate eccentric regurgitation. - Right ventricle: Systolic function was reduced. Lateral annulus peak S velocity: 7.57 cm/s. - Pulmonic valve: There was mild regurgitation.  08/2014 Stress MPI  No diagnostic ST segment changes noted.  Overall poor quality images with significant extracardiac gut uptake and limited cardiac counts.  Defect 1: There is a medium defect of mild severity present in the mid anteroseptal, mid anterolateral, apical anterior and apical septal location. The defect is partially reversible. Consistent with combination of scar and mild to moderate peri-infarct ischemia.  Defect 2: There is a large defect of severe severity present in the basal inferior, basal inferolateral, mid inferior, mid inferolateral and apical inferior location. The defect is non-reversible. Consistent with scar.  This is a high risk study.  Nuclear stress EF: 20%. Consistent with ischemic cardiomyopathy      Assessment and Plan  1..Chronic systolic heart failure/Ischemic CM/CAD - systemic symptoms over the last week, not specifically heart related by history. Generalized fatigue, nausea, and poor apptetite. Seen in ER ,records are pending but she apparently had AKI and lasix, losartan, aldactone  have been held - encouraged increased oral hydration. We will repeat labs, depending on renal function consider restarting her cardiac meds. She has some LE edema but would not restart lasix at this time. I have asked her to f/u with her pcp in the next day or two for further evaluation, as I am not sure what the original etiology of her symptoms was. She will also hold ranexa given her AKI. We will also have her clarify if she is taking aldactone or not, our records do not indicate it however at her ER visit they mentioned she was on it and patient believes she has been taking.   F/u 2 weeks.       Antoine PocheJonathan F. Baldwin Racicot, M.D.,

## 2015-06-16 ENCOUNTER — Telehealth: Payer: Self-pay

## 2015-06-16 ENCOUNTER — Other Ambulatory Visit: Payer: Self-pay

## 2015-06-16 ENCOUNTER — Ambulatory Visit: Payer: Medicare Other | Admitting: Adult Health

## 2015-06-16 NOTE — Telephone Encounter (Signed)
Ok, please update her chart. Have her bring all her pill bottles next visit. From our chart that had been stopped in the past because of causing renal issues. She is to remain off of it at this time.   [name] FerryJ [name] Mires MD

## 2015-06-16 NOTE — Telephone Encounter (Signed)
-----   Message from Antoine PocheJonathan F Branch, MD sent at 06/16/2015 10:38 AM EDT ----- Can we verfiy with patient if she was taking spironolactone at home and what dose. We do not have it on our records but from her recent ER visit they mentioned she was on it and the patient had mentioned being on it. PLease have her clarify with the pill bottle and the dose.   Dominga FerryJ Branch MD

## 2015-06-16 NOTE — Telephone Encounter (Signed)
Pt is taking spironolactone 40 mg daily. She read what her pill bottle said to me.

## 2015-06-16 NOTE — Telephone Encounter (Signed)
Called pt back and let her know that spironolactone doesn't come in 40 mg and she said she made a mistake, that it is 50 mg and she takes it once daily.

## 2015-06-20 ENCOUNTER — Telehealth: Payer: Self-pay | Admitting: Cardiology

## 2015-06-20 NOTE — Telephone Encounter (Signed)
Patient's daughter called to say that [facility] was [facility] w/ [facility].  tg

## 2015-06-20 NOTE — Telephone Encounter (Signed)
Forward to Dr Branch 

## 2015-06-29 ENCOUNTER — Ambulatory Visit (INDEPENDENT_AMBULATORY_CARE_PROVIDER_SITE_OTHER): Payer: Medicare Other | Admitting: Physician Assistant

## 2015-06-29 ENCOUNTER — Encounter: Payer: Self-pay | Admitting: Physician Assistant

## 2015-06-29 VITALS — BP 94/62 | HR 78 | Ht 62.0 in | Wt 218.0 lb

## 2015-06-29 DIAGNOSIS — N183 Chronic kidney disease, stage 3 unspecified: Secondary | ICD-10-CM | POA: Insufficient documentation

## 2015-06-29 DIAGNOSIS — I255 Ischemic cardiomyopathy: Secondary | ICD-10-CM

## 2015-06-29 DIAGNOSIS — I5022 Chronic systolic (congestive) heart failure: Secondary | ICD-10-CM

## 2015-06-29 DIAGNOSIS — R001 Bradycardia, unspecified: Secondary | ICD-10-CM | POA: Diagnosis not present

## 2015-06-29 DIAGNOSIS — I080 Rheumatic disorders of both mitral and aortic valves: Secondary | ICD-10-CM

## 2015-06-29 DIAGNOSIS — I952 Hypotension due to drugs: Secondary | ICD-10-CM

## 2015-06-29 DIAGNOSIS — I959 Hypotension, unspecified: Secondary | ICD-10-CM | POA: Insufficient documentation

## 2015-06-29 MED ORDER — FUROSEMIDE 40 MG PO TABS: 40.0000 mg | ORAL_TABLET | Freq: Two times a day (BID) | ORAL | Status: DC

## 2015-06-29 NOTE — Progress Notes (Signed)
Cardiology Office Note    Date:  06/29/2015   ID:  [name] [name], DOB 1940-03-08, MRN [id number]  PCP:  Redmond School, NP  Cardiologist:  Dr. Wyline Mood    Chief complaint shortness of breath  History of Present Illness:  [name] [name] is a 75 y.o. female with history of chronic systolic heart failure/ischemic cardiomyopathy status post CABG 3 in 2002 with an SVG to the LAD, SVG to the OM, SVG to the PDA. EF 40% by echo in 03/2012. MPI and 08/2014 showed large inferior scar, anterior defect with some reversibility and reported EF of 20% indicating high risk study however follow-up echo 07/2014 EF 40%. Dr. Wyline Mood saw her on 06/15/15 after she went to the emergency room with AKI with elevated creatinine and BUN. Lasix losartan and Aldactone were all held although it turns out she was taking Aldactone 50 mg daily and she was told to stop. He encouraged her to increase oral hydration and checked her labs with a creatinine of 1.93. Ranexa was held given her AKI.  Patient then was readmitted to the [facility] with recurrent CHF on 06/20/15. Full review of records in care everywhere was done. Due to low blood pressures and urine output she was started on dopamine CKD stage III discharge creatinine 1.5 06/23/15 was 40/1.5 at [facility]. 2-D echo reviewed and shows severely reduced LV function less than 25% with abnormal diastolic function, anterior septal and apical wall akinesis, anterolateral wall hypokinesis with severe diffuse hypokinesis, 3+ MR, mild aortic sclerosis. Troponin elevated at 0.6, BNP 27637 hemoglobin 9.7 improved to 10.6, LFTs elevated ALTs 685 AST 141. Abnormal gallbladder wall thickening without obvious gallstones. She was discharged home on Coreg 3.125 once daily and Lasix 40 mg twice a day, baby aspirin and Lipitor.  Patient comes in today accompanied by her brother and granddaughter. 2 days after she was discharged from the hospital her weight went up 9 pounds. The called  the hospital and were told to increase Lasix to 40 in the morning 80 in the evening. She did that for 2 days and her weight dropped 9 pounds. She still complains of shortness of breath and waking up short of breath. Her leg edema is stable but chronic. She said she is watching her salt intake closely.     Past Medical History  Diagnosis Date  . Obesity   . Essential hypertension   . Hyperlipidemia   . Mitral valve insufficiency and aortic valve insufficiency     a. Mild-mod MR by echo 07/2014.  Marland Kitchen CAD (coronary artery disease)     a. s/p 3V CABG 2002 (SVG-LAD, SVG-OM, SVG-PDA).  . Diabetes mellitus (HCC)   . Chronic systolic CHF (congestive heart failure) (HCC)     a. EF 40%.  . Ischemic cardiomyopathy   . Sinus bradycardia   . Hypotension     Past Surgical History  Procedure Laterality Date  . Coronary artery bypass graft  08/13/2000     [name]    Current Medications: Outpatient Prescriptions Prior to Visit  Medication Sig Dispense Refill  . aspirin 81 MG tablet Take 81 mg by mouth daily.      Marland Kitchen atorvastatin (LIPITOR) 40 MG tablet Take 1 tablet by mouth  daily 90 tablet 3  . Cholecalciferol (D-3-5) 5000 UNITS capsule Take 5,000 Units by mouth daily.    Marland Kitchen HUMALOG MIX 75/25 (75-25) 100 UNIT/ML SUSP injection Inject 32 Units into the skin 2 (two) times daily.     Marland Kitchen  nitroGLYCERIN (NITROSTAT) 0.4 MG SL tablet Place 1 tablet (0.4 mg total) under the tongue every 5 (five) minutes as needed. 25 tablet 3  . Omega-3 Fatty Acids (FISH OIL) 1200 MG CAPS Take 2 tabs by mouth every morning & 1 tab every evening    . ranitidine (ZANTAC) 150 MG tablet Take 1 tablet by mouth two  times daily 180 tablet 3  . calcium-vitamin D (OSCAL 500/200 D-3) 500-200 MG-UNIT per tablet Take 1 tablet by mouth daily.      . furosemide (LASIX) 40 MG tablet TAKE 2 TABLETS ( 80 MG ) IN THE MORNING AND 1 TABLET ( 40 MG ) IN THE EVENING (Patient not taking: Reported on 06/15/2015) 90 tablet 6  .  linagliptin (TRADJENTA) 5 MG TABS tablet Take 5 mg by mouth daily.    Marland Kitchen losartan (COZAAR) 25 MG tablet Take 1 tablet by mouth  daily (Patient not taking: Reported on 06/15/2015) 15 tablet 0  . metoprolol succinate (TOPROL XL) 25 MG 24 hr tablet Take 0.5 tablets (12.5 mg total) by mouth daily. 45 tablet 3  . RANEXA 500 MG 12 hr tablet Take 1 tablet by mouth two  times daily (Patient not taking: Reported on 06/15/2015) 180 tablet 3  . spironolactone (ALDACTONE) 50 MG tablet Take 50 mg by mouth daily.    Marland Kitchen ULORIC 40 MG tablet Take 1 tablet by mouth daily.     No facility-administered medications prior to visit.     Allergies:   Spironolactone and Sulfonamide derivatives   Social History   Social History  . Marital Status: Married    Spouse Name: OBEDIAH  . Number of Children: N/A  . Years of Education: N/A   Occupational History  . RETIRED    Social History Main Topics  . Smoking status: Former Smoker -- 1.50 packs/day for 15 years    Types: Cigarettes    Start date: 08/03/1981    Quit date: 01/30/1996  . Smokeless tobacco: Never Used  . Alcohol Use: No  . Drug Use: No  . Sexual Activity: Not Asked   Other Topics Concern  . None   Social History Narrative     Family History:  The patient's family history is not on file.   ROS:   Please see the history of present illness.    Review of Systems  Constitution: Positive for weight gain.  HENT: Negative.   Eyes: Negative.   Cardiovascular: Positive for dyspnea on exertion, leg swelling and paroxysmal nocturnal dyspnea.  Respiratory: Positive for shortness of breath and sleep disturbances due to breathing.   Hematologic/Lymphatic: Negative.   Musculoskeletal: Positive for arthritis, back pain and stiffness. Negative for joint pain.  Gastrointestinal: Negative.   Genitourinary: Negative.   Neurological: Negative.    All other systems reviewed and are negative.   PHYSICAL EXAM:   VS:  BP 94/62 mmHg  Pulse 78  Ht   (1.575 m)  Wt 218 lb (98.884 kg)  BMI 39.86 kg/m2  SpO2 96%   GEN: Well nourished, well developed, in no acute distress Neck: Slight increase JVD, no carotid bruits, or masses Cardiac:  RRR; 2/6 systolic murmur at the apex, no rubs, or gallops, +2 edema bilaterally  Respiratory:  Decreased breath sounds but clear to auscultation bilaterally, normal work of breathing GI: soft, nontender, nondistended, + BS MS: no deformity or atrophy Skin: warm and dry, no rash Neuro:  Alert and Oriented x 3, Strength and sensation are intact Psych: euthymic mood, full  affect  Wt Readings from Last 3 Encounters:  06/29/15 218 lb (98.884 kg)  06/15/15 222 lb (100.699 kg)  11/05/14 220 lb (99.791 kg)      Studies/Labs Reviewed:   EKG:  EKG is  ordered today.  The ekg ordered today demonstrates Normal sinus rhythm with nonspecific ST-T wave changes  Recent Labs: 09/09/2014: ALT 12; Hemoglobin 11.6*; Platelets 152 09/13/2014: BUN 43*; Creat 1.96*; Potassium 4.8; Sodium 135   Lipid Panel No results found for: CHOL, TRIG, HDL, CHOLHDL, VLDL, LDLCALC, LDLDIRECT  Additional studies/ records that were reviewed today include:   [facility] 06/20/15 Interpretation Summary A complete two-dimensional transthoracic echocardiogram with color flow Doppler and spectral Doppler was performed. Definity contrast injection performed.  The aortic valve is trileaflet. Mild aortic sclerosis is present with good valvular opening. There is moderately severe [3+] mitral regurgitation. There is mild (1+) tricuspid regurgitation. Right ventricular systolic pressure is elevated between 40-7050mm Hg, consistent with moderate pulmonary hypertension. There is no thrombus. The left ventricle is normal in size. There is normal left ventricular wall thickness. The right ventricular ejection fraction is grossly normal. The left atrium is mildly dilated. There is anterolateral wall hypokinesis. There  is severe diffuse hypokinesis of the left ventricle. The left ventricular ejection fraction is severely reduced (<25%). The left ventricular diastolic function is abnormal.  Left Ventricle The left ventricle is normal in size. There is no thrombus. There is normal left ventricular wall thickness. There is anteroseptal and apical wall akinesis. There is anterolateral wall hypokinesis. There is severe diffuse hypokinesis of the left ventricle.The left ventricular ejection fraction is severely reduced (<25%). The left ventricular diastolic function is abnormal.   Right Ventricle The right ventricle is normal size. The right ventricular ejection fraction is grossly normal.  Atria The left atrium is mildly dilated. The right atrium is normal.  Mitral Valve The mitral valve leaflets appear normal. There is no evidence of stenosis, fluttering, or prolapse. The mitral leaflets are thin and pliable. They do not appear to be hooded or redundant. There is moderately severe [3+] mitral regurgitation.   Tricuspid Valve The tricuspid valve leaflets are thin and pliable. The tricuspid valve leaflets are thin and pliable and the valve motion is normal. There is mild (1+) tricuspid regurgitation.Right ventricular systolic pressure is elevated between 40-7050mm Hg, consistent with moderate pulmonary hypertension.  Aortic Valve The aortic valve is trileaflet. Mild aortic sclerosis is present with good valvular opening. There is discrete nodular thickening of the non-coronary cusp of the aortic valve. There is trace aortic regurgitation present.  Pulmonic Valve The pulmonic valve is not well visualized.  Vessels The aortic root is normal. The pulmonary artery is not well visualized.  Pericardium There is no pericardial effusion.  MMode/2D Measurements & Calculations IVSd: 0.89 cm LVIDd: 5.6 cm LVIDs: 4.9 cm LVPWd: 0.93 cm  ASSESSMENT:    1. Bradycardia   2. Chronic systolic heart  failure (HCC)   3. Cardiomyopathy, ischemic   4. CKD (chronic kidney disease), stage III   5. Hypotension due to drugs   6. MITRAL REGURG W/ AORTIC INSUFF, RHEUM/NON-RHEUM      PLAN:  In order of problems listed above: Patient's heart rate is 70 on low-dose Coreg only 3.125 mg daily at bedtime  Chronic systolic heart failure patient had recent hospitalization [facility] EF is less than 25% on recent 2-D echo with 3+ MR. Patient was diuresed but gained 9 pounds 2 days later. Lasix  was increased to 40 in the morning 80 in the evening for 2 days and she is loss the 9 pounds. We'll decrease her Lasix back to 40 mg twice a day. She still has quite a bit of edema which she says is baseline. Order compression hose. Recommend [facility] to help manage her as an outpatient. 2 g sodium diet. Follow-up with Dr. branch in 2-3 weeks. She is also being seen in the heart failure clinic at [facility] next week. I told her to think about choosing one cardiologist.  Ischemic cardiomyopathy patient denies chest pain. Many of her medications were stopped due to acute renal failure and hypotension. Continue low-dose Coreg  CK D stage III creatinine was stable at discharge. We'll not repeat labs today.  Hypotension blood pressure remains on the low side. Cannot add any more medications  3+ MR on recent 2-D echo continue to follow    Medication Adjustments/Labs and Tests Ordered: Current medicines are reviewed at length with the patient today.  Concerns regarding medicines are outlined above.  Medication changes, Labs and Tests ordered today are listed in the Patient Instructions below. Patient Instructions  Your physician recommends that you schedule a follow-up appointment in: 2-3 Weeks with Dr. Wyline Mood   Your physician has recommended you make the following change in your medication:   Take Lasix 40 mg Two Times Daily  You have been referred to  [facility]   If you need a refill on your cardiac  medications before your next appointment, please call your pharmacy.  Thank you for choosing [facility]!          Elson Clan, PA-C  06/29/2015 12:25 PM    [facility] [address] [place], [place], [place]  [address] Phone: [phone]; Fax: [phone]

## 2015-06-29 NOTE — Patient Instructions (Signed)
Your physician recommends that you schedule a follow-up appointment in: 2-3 Weeks with Dr. Wyline MoodBranch   Your physician has recommended you make the following change in your medication:   Take Lasix 40 mg Two Times Daily  [facility]  [facility]   If you need a refill on your cardiac medications before your next appointment, please call your pharmacy.  Thank you for choosing [facility]!

## 2015-07-01 ENCOUNTER — Other Ambulatory Visit: Payer: Self-pay | Admitting: *Deleted

## 2015-07-01 NOTE — Patient Outreach (Signed)
[facility]) Care Management  07/01/2015  [name] ColderDorothy C [name] 03/17/1940 [id number]  Referral per MD: dx Heart failure  Telephone call to patient; left message on voice mail requesting call back:  Plan: Will follow up.  Colleen CanLinda Kathee Tumlin, RN BSN CCM Care Management Coordinator [facility] Care Management  [phone]

## 2015-07-04 ENCOUNTER — Other Ambulatory Visit: Payer: Self-pay | Admitting: *Deleted

## 2015-07-04 DIAGNOSIS — I5022 Chronic systolic (congestive) heart failure: Secondary | ICD-10-CM

## 2015-07-04 NOTE — Patient Outreach (Signed)
[facility]) Care Management  07/04/2015  [name] ColderDorothy C [name] 07/22/1940 [id number]  Telephone call x 3 to patient. HIPPA verification received from patient and [facility] services explained to patient.   Patient states she had recent hospital stay [facility] from 05/20-05/25/2017 with problems of fluid build up, swelling of feet/ankles and shortness of breath.  States she has heart failure and does have cardiologist. States she has seen primary care doctor -Dr. Emeline GeneralAngela Kilby in follow up 06/02 and cardiology PA-Michele Geni BersLenze 05/31. States she is taking medications as ordered by her doctors. Has no difficulty getting to appointments. States husband takes her to MD appointments and daughter assists her with personal care & medications. States she has workable Keil. States she weighed 220 pounds at discharge. States weight today is 217 pounds. She does have swelling in ankles and feet and swelling was present when she attended doctor's appointment. States MD ordered compression stocking to be worn during the day. States [place] but will be [place] Heart failure Clinic in [place] in the future. States arrangements are being made.   Patient voices that she is following low salt diet and walking daily with husband's assistance.  [facility].  Plan: Refer to care management assistant to assign to community care coordinator for complex case management of patient with recent hospital with dx heart failure.  Colleen CanLinda Teyanna Thielman, RN BSN CCM Care Management Coordinator [facility] Care Management  [phone]

## 2015-07-11 ENCOUNTER — Other Ambulatory Visit: Payer: Self-pay | Admitting: *Deleted

## 2015-07-11 NOTE — Patient Outreach (Addendum)
Telephone outreach to get a home visit scheduled for disease management. I left a message and requested a return call.  Almetta LovelyCarroll Ngai Parcell [facility]-BC [facility] Care Manager [phone]  Called pt again after hours and left another message. I will try again tomorrow.  Almetta LovelyCarroll Cambri Plourde [facility]-BC [facility] Care Manager [phone]

## 2015-07-12 ENCOUNTER — Other Ambulatory Visit: Payer: Self-pay | Admitting: *Deleted

## 2015-07-13 ENCOUNTER — Other Ambulatory Visit: Payer: Self-pay | Admitting: *Deleted

## 2015-07-13 NOTE — Patient Outreach (Signed)
I was finally able to reach Mrs. Thorstenson and she agreed to meet with me next Wednesday for our initial home visit.  Almetta LovelyCarroll Suheyb Raucci [facility]-BC [facility] Care Manager [phone]

## 2015-07-13 NOTE — Patient Outreach (Signed)
I was finally able to talk with pt and she agreed for me to visit her next week for our first home visit for HF education.  Almetta LovelyCarroll Jesilyn Easom [facility]-BC [facility] Care Manager [phone]

## 2015-07-14 ENCOUNTER — Encounter: Payer: Self-pay | Admitting: Cardiology

## 2015-07-14 ENCOUNTER — Ambulatory Visit (INDEPENDENT_AMBULATORY_CARE_PROVIDER_SITE_OTHER): Payer: Medicare Other | Admitting: Cardiology

## 2015-07-14 VITALS — BP 99/68 | HR 82 | Ht 62.0 in | Wt 221.2 lb

## 2015-07-14 DIAGNOSIS — I10 Essential (primary) hypertension: Secondary | ICD-10-CM | POA: Diagnosis not present

## 2015-07-14 DIAGNOSIS — I25119 Atherosclerotic heart disease of native coronary artery with unspecified angina pectoris: Secondary | ICD-10-CM

## 2015-07-14 DIAGNOSIS — I5022 Chronic systolic (congestive) heart failure: Secondary | ICD-10-CM | POA: Diagnosis not present

## 2015-07-14 DIAGNOSIS — G4730 Sleep apnea, unspecified: Secondary | ICD-10-CM

## 2015-07-14 MED ORDER — FUROSEMIDE 40 MG PO TABS: 80.0000 mg | ORAL_TABLET | Freq: Every day | ORAL | Status: DC

## 2015-07-14 NOTE — Progress Notes (Addendum)
Clinical Summary [name] [name] is a 75 y.o.female seen today for follow up of the following medical problems.   1. Chronic systolic heart failure/Ischemic cardiomyopathy/CAD - prior CABG 3 vessel in 2002 (SVG-LAD, SVG-OM, SVG-PDA).  - LVEF 40% by echo 03/2012  - she has had some sinus bradycardia limiting the dosing of her beta blocker. Aldactone caused AKI.  - MPI 08/2014 showed large inferior scar, anterior defect with some reversibility. Reported LVEF 20% indicating high risk study, however recent echo 07/2014 LVEF 40%.    - [facility] with [facility]. Was started on dopamine drip for diuresis. Echo with LVEF 25%, MR.  - off majority of CHF meds due to hypotension and CKD, now only on low dose coreg.  - home weights have been stable since discharge. Stable SOB since discharge.  - [facility] assisting with home CHF care.     2. OSA - not followed by doctor at this time.  - family reports machine is several years old, has not been reevaluated for several years.   Past Medical History  Diagnosis Date  . Obesity   . Essential hypertension   . Hyperlipidemia   . Mitral valve insufficiency and aortic valve insufficiency     a. Mild-mod MR by echo 07/2014.  Marland Kitchen. CAD (coronary artery disease)     a. s/p 3V CABG 2002 (SVG-LAD, SVG-OM, SVG-PDA).  . Diabetes mellitus (HCC)   . Chronic systolic CHF (congestive heart failure) (HCC)     a. EF 40%.  . Ischemic cardiomyopathy   . Sinus bradycardia   . Hypotension      Allergies  Allergen Reactions  . Spironolactone     Not allergic but had worsening of renal function while on this.  . Sulfonamide Derivatives     REACTION: stomach upset     Current Outpatient Prescriptions  Medication Sig Dispense Refill  . aspirin 81 MG tablet Take 81 mg by mouth daily.      Marland Kitchen. atorvastatin (LIPITOR) 40 MG tablet Take 1 tablet by mouth  daily 90 tablet 3  . carvedilol (COREG) 3.125 MG tablet Take 3.125 mg by mouth at  bedtime.    . Cholecalciferol (D-3-5) 5000 UNITS capsule Take 5,000 Units by mouth daily.    . furosemide (LASIX) 40 MG tablet Take 1 tablet (40 mg total) by mouth 2 (two) times daily. 180 tablet 3  . HUMALOG MIX 75/25 (75-25) 100 UNIT/ML SUSP injection Inject 32 Units into the skin 2 (two) times daily.     . nitroGLYCERIN (NITROSTAT) 0.4 MG SL tablet Place 1 tablet (0.4 mg total) under the tongue every 5 (five) minutes as needed. 25 tablet 3  . Omega-3 Fatty Acids (FISH OIL) 1200 MG CAPS Take 2 tabs by mouth every morning & 1 tab every evening    . ranitidine (ZANTAC) 150 MG tablet Take 1 tablet by mouth two  times daily 180 tablet 3   No current facility-administered medications for this visit.     Past Surgical History  Procedure Laterality Date  . Coronary artery bypass graft  08/13/2000     Mikey BussingVan, [place]     Allergies  Allergen Reactions  . Spironolactone     Not allergic but had worsening of renal function while on this.  . Sulfonamide Derivatives     REACTION: stomach upset      Family History  Problem Relation Age of Onset  . Hypertension  Social History [name] [name] reports that she quit smoking about 19 years ago. Her smoking use included Cigarettes. She started smoking about 33 years ago. She has a 22.5 pack-year smoking history. She has never used smokeless tobacco. [name] [name] reports that she does not drink alcohol.   Review of Systems CONSTITUTIONAL: No weight loss, fever, chills, weakness or fatigue.  HEENT: Eyes: No visual loss, blurred vision, double vision or yellow sclerae.No hearing loss, sneezing, congestion, runny nose or sore throat.  SKIN: No rash or itching.  CARDIOVASCULAR: per HPI RESPIRATORY: No shortness of breath, cough or sputum.  GASTROINTESTINAL: No anorexia, nausea, vomiting or diarrhea. No abdominal pain or blood.  GENITOURINARY: No burning on urination, no polyuria NEUROLOGICAL: No headache, dizziness, syncope, paralysis,  ataxia, numbness or tingling in the extremities. No change in bowel or bladder control.  MUSCULOSKELETAL: No muscle, back pain, joint pain or stiffness.  LYMPHATICS: No enlarged nodes. No history of splenectomy.  PSYCHIATRIC: No history of depression or anxiety.  ENDOCRINOLOGIC: No reports of sweating, cold or heat intolerance. No polyuria or polydipsia.  Marland Kitchen   Physical Examination Filed Vitals:   07/14/15 1433  BP: 99/68  Pulse: 82   Filed Vitals:   07/14/15 1433  Height: 5\' 2"  (1.575 m)  Weight: 221 lb 3.2 oz (100.336 kg)    Gen: resting comfortably, no acute distress HEENT: no scleral icterus, pupils equal round and reactive, no palptable cervical adenopathy,  CV: RRR, no m/r/g, no jvd Resp: Clear to auscultation bilaterally GI: abdomen is soft, non-tender, non-distended, normal bowel sounds, no hepatosplenomegaly MSK: extremities are warm, no edema.  Skin: warm, no rash Neuro:  no focal deficits Psych: appropriate affect   Diagnostic Studies  Cath 07/2000  HEMODYNAMIC DATA:  A. 1. Aorta: 161/94/120.  2. LV: 154/23/32.  3. RA: 29.  4. RV: 57/25.  5. Pulmonary artery: 57/35.  6. Pulmonary capillary wedge: 33.  7. No aortic to LV gradient.  B. 1. Cardiac output Fick: 3.5 L/min.  2. Fick cardiac index: 1.67 L/min/sq m.  3. Thermodilution cardiac output: 3.7 L/min.  4. Thermodilution cardiac index: 1.76 L/min/sq m.  C. SATURATIONS:  1. Pulmonary artery: 56.  2. Aorta: 95.  ANGIOGRAPHIC DATA:  1. Ventriculography was performed in the RAO projection. There was  inferobasal hypo- to akinesis. There was at least 2+ and possibly  3+ mitral regurgitation. The inferobasal segment was hypo- to akinetic.  The ejection fraction was calculated at 52%.  2. The left main coronary artery has about 40% to 50% distal narrowing.  3. The left anterior descending artery has a 90% stenosis just beyond the  septal perforator and then diffuse 50% narrowing beyond  the large  diagonal. The diagonal itself has about 50% narrowing. There was a  ramus intermedius vessel that has some mild luminal irregularity, but  is free of critical disease.  4. The AV circumflex provides a marginal Galen Russman that is diffusely diseased  with about 70% proximal narrowing and then the vessel fills slowly  distally. There is a retrograde filling of a large circumflex system.  5. The right coronary artery is severely diseased with 40% proximal narrowing  and a subtotal occlusion in the mid vessel. There are multiple lesions of  50% and then 80% to 90% distally. The PDA stops in the mid vessel and  there is competitive filling in the distal vessel. The severity of disease  at this location cannot be ascertained. The posterolateral Dontavion Noxon is  large as well.  CONCLUSIONS:  1. Mild reduction of global left ventricular function with an inferobasilar  wall motion abnormality.  2. Patency of the subclavian and internal mammary vessel.  3. Severe three-vessel coronary artery disease.  4. Moderate mitral regurgitation.  5. Pulmonary hypertension secondary to left ventricular dysfunction and  some mitral regurgitation, as well as ischemia.    03/2012 Echo  LVEF 40%, mild LVH, grade I diastolic dysfunction, mild MR, PASP 25  07/2014 echo Study Conclusions  - Procedure narrative: Transthoracic echocardiography for left ventricular function evaluation, for right ventricular function evaluation, and for assessment of valvular function. Image quality was poor. The study was technically difficult, as a result of poor acoustic windows and body habitus. - Left ventricle: The cavity size was normal. Wall thickness was normal. Systolic function was moderately reduced, estimated LVEF 40%. Doppler parameters are consistent with abnormal left ventricular relaxation (grade 1 diastolic dysfunction). Doppler parameters are consistent with high  ventricular filling pressure. - Regional wall motion abnormality: Hypokinesis of the basal-mid anteroseptal, mid inferior, and basal-mid inferolateral myocardium; moderate hypokinesis of the mid anterior myocardium. - Aortic valve: Poorly visualized. Mildly thickened leaflets. There was trivial regurgitation. - Mitral valve: There was mild to moderate eccentric regurgitation. - Right ventricle: Systolic function was reduced. Lateral annulus peak S velocity: 7.57 cm/s. - Pulmonic valve: There was mild regurgitation.  08/2014 Stress MPI  No diagnostic ST segment changes noted.  Overall poor quality images with significant extracardiac gut uptake and limited cardiac counts.  Defect 1: There is a medium defect of mild severity present in the mid anteroseptal, mid anterolateral, apical anterior and apical septal location. The defect is partially reversible. Consistent with combination of scar and mild to moderate peri-infarct ischemia.  Defect 2: There is a large defect of severe severity present in the basal inferior, basal inferolateral, mid inferior, mid inferolateral and apical inferior location. The defect is non-reversible. Consistent with scar.  This is a high risk study.  Nuclear stress EF: 20%. Consistent with ischemic cardiomyopathy   2-D echo at [facility] 06/20/15 Interpretation Summary A complete two-dimensional transthoracic echocardiogram with color flow Doppler and spectral Doppler was performed. Definity contrast injection performed.  The aortic valve is trileaflet. Mild aortic sclerosis is present with good valvular opening. There is moderately severe [3+] mitral regurgitation. There is mild (1+) tricuspid regurgitation. Right ventricular systolic pressure is elevated between 40-71mm Hg, consistent with moderate pulmonary hypertension. There is no thrombus. The left ventricle is normal in size. There is normal left ventricular wall  thickness. The right ventricular ejection fraction is grossly normal. The left atrium is mildly dilated. There is anterolateral wall hypokinesis. There is severe diffuse hypokinesis of the left ventricle. The left ventricular ejection fraction is severely reduced (<25%). The left ventricular diastolic function is abnormal.  Left Ventricle The left ventricle is normal in size. There is no thrombus. There is normal left ventricular wall thickness. There is anteroseptal and apical wall akinesis. There is anterolateral wall hypokinesis. There is severe diffuse hypokinesis of the left ventricle.The left ventricular ejection fraction is severely reduced (<25%). The left ventricular diastolic function is abnormal.   Right Ventricle The right ventricle is normal size. The right ventricular ejection fraction is grossly normal.  Atria The left atrium is mildly dilated. The right atrium is normal.  Mitral Valve The mitral valve leaflets appear normal. There is no evidence of stenosis, fluttering, or prolapse. The mitral leaflets are thin and pliable. They do not appear to be hooded or redundant. There is  moderately severe [3+] mitral regurgitation.   Tricuspid Valve The tricuspid valve leaflets are thin and pliable. The tricuspid valve leaflets are thin and pliable and the valve motion is normal. There is mild (1+) tricuspid regurgitation.Right ventricular systolic pressure is elevated between 40-72mm Hg, consistent with moderate pulmonary hypertension.  Aortic Valve The aortic valve is trileaflet. Mild aortic sclerosis is present with good valvular opening. There is discrete nodular thickening of the non-coronary cusp of the aortic valve. There is trace aortic regurgitation present.  Pulmonic Valve The pulmonic valve is not well visualized.  Vessels The aortic root is normal. The pulmonary artery is not well visualized.  Pericardium There is no pericardial  effusion.  MMode/2D Measurements & Calculations IVSd: 0.89 cm LVIDd: 5.6 cm LVIDs: 4.9 cm LVPWd: 0.93 cm  Assessment and Plan  1..Chronic systolic heart failure/Ischemic CM/CAD - recent admission with CHF, clinically she has progressing/worsening CHF. Diuresis during recent admission required dopamine support given hypotension and poor renal function, significant LFT elevation at that time suggesting hepatic congestion. LVEF has decreased by most recent echo - given soft bp's and poor renal function medical therapy is limited. Appears volume overloaded, we will increase lasix to  bid.  - she has a prior CAD history. Given poor renal function would be hesitant for repeat LHC in setting of drop in LVEF. He last Cr at hospital dishcharge had decreased to 1.5, may be reasonable to consider LHC with minimal dye if renal function remains stable.  Given her recent clinical course she may need to be considered for RHC to assess cardiac output and filling pressures, especially if she does not respond to increase in diuretics. We will look to arrange close f/u with CHF clinic to help with management. We will repat labs  2. OSA - refer to Dr Juanetta Gosling to help with management.     Antoine Poche, M.D.   07/19/15 Addendum RHC scheduled for Thursday. LVEF drop from 40 to <25% during recent admit at [facility] with CHF. During admission required dopamine for diuresis in setting of renal insufficiency and hypotension. Significant LFT elevation at that time suggesting hepatic congestion. Continued severe CHF symptoms and volume overload on clinic f/u, Cr trending up again. Asking for RHC to evaluation CO and filling pressurse, if significant low output or very high filling pressures would ask for CHF evaluation as inpatient. Given her renal function would not pursue LHC at this time, though recurrent CAD is certainly possible.    Dominga Ferry MD

## 2015-07-14 NOTE — Patient Instructions (Signed)
Your physician recommends that you schedule a follow-up appointment in: 3 WEEKS WITH DR. BRANCH  Your physician has recommended you make the following change in your medication:   INCREASE LASIX 80 MG DAILY (2 TABLETS)  Your physician recommends that you return for lab work CBC/CMP/MG  You have been referred to CHF CLINIC AND DR. HAWKINS   Thank you for choosing [facility]!!

## 2015-07-16 ENCOUNTER — Telehealth: Payer: Self-pay | Admitting: Cardiology

## 2015-07-18 ENCOUNTER — Telehealth: Payer: Self-pay | Admitting: *Deleted

## 2015-07-18 NOTE — Telephone Encounter (Signed)
Spoke with patient to follow up on her symptoms per Dr. Wyline MoodBranch request. Patient said her swelling is the about same but her breathing a little bit better but she is still having som sob. Patient said her weight this morning was 211 lbs.

## 2015-07-18 NOTE — Telephone Encounter (Signed)
-----   Message from Antoine PocheJonathan F Branch, MD sent at 07/18/2015  9:45 AM EDT ----- Can we check in with [name] [name] and see how her breathing, weights, and swelling are doing?   Dominga FerryJ Branch MD

## 2015-07-18 NOTE — Telephone Encounter (Signed)
Her weights compared to our clinic numbers is down. Her labs show that her renal function is somewhat decreased from when she was discharged from the hospital. I would like her to have a RHC this week to evaluate her filling pressures and cardiac output. I am concerned that her heart pumping is significantly worst, a RHC would help us know exactly how much her heart function is working and exactly how much extra fluid she still has in her body. This will also help the CHF team when she is seen by them in clinic, if her numbers are bad enough she may need to be seen the day of her test. Please arrange a RHC for systolic heart failure, does not need to hold any meds.   Dominga FerryJ Amalio Loe MD

## 2015-07-19 ENCOUNTER — Other Ambulatory Visit: Payer: Self-pay | Admitting: Cardiology

## 2015-07-19 ENCOUNTER — Telehealth: Payer: Self-pay | Admitting: Cardiology

## 2015-07-19 ENCOUNTER — Encounter: Payer: Self-pay | Admitting: *Deleted

## 2015-07-19 DIAGNOSIS — I5023 Acute on chronic systolic (congestive) heart failure: Secondary | ICD-10-CM

## 2015-07-19 NOTE — Telephone Encounter (Signed)
RHC dx: systolic heart failure-arranged for Thursday, July 21, 2015 @9 :00 am with [facility].  Checking percert

## 2015-07-19 NOTE — Telephone Encounter (Addendum)
RHC dx: systolic heart failure-arranged for Thursday, July 21, 2015 @9 :00 am with Dr[facility]. Arrival at 7:00 am.  Patient aware of RHC and [facility]. Patient instructions for heart cath read to patient while on phone and made available through [place] and didn't want to drive to the office for instructions. Patient verbalized understanding of heart cath instructions.

## 2015-07-19 NOTE — Telephone Encounter (Signed)
Patient aware.

## 2015-07-20 ENCOUNTER — Ambulatory Visit: Payer: Self-pay | Admitting: *Deleted

## 2015-07-20 ENCOUNTER — Other Ambulatory Visit: Payer: Self-pay | Admitting: *Deleted

## 2015-07-20 ENCOUNTER — Encounter (HOSPITAL_COMMUNITY): Payer: Self-pay

## 2015-07-20 ENCOUNTER — Ambulatory Visit (HOSPITAL_BASED_OUTPATIENT_CLINIC_OR_DEPARTMENT_OTHER)
Admission: RE | Admit: 2015-07-20 | Discharge: 2015-07-20 | Disposition: A | Discharge status: Home / Self Care | Payer: Medicare Other | Source: Ambulatory Visit | Attending: Cardiology | Admitting: Cardiology

## 2015-07-20 VITALS — BP 108/68 | HR 81 | Wt 223.0 lb

## 2015-07-20 DIAGNOSIS — N183 Chronic kidney disease, stage 3 unspecified: Secondary | ICD-10-CM

## 2015-07-20 DIAGNOSIS — Z8249 Family history of ischemic heart disease and other diseases of the circulatory system: Secondary | ICD-10-CM | POA: Insufficient documentation

## 2015-07-20 DIAGNOSIS — Z794 Long term (current) use of insulin: Secondary | ICD-10-CM | POA: Insufficient documentation

## 2015-07-20 DIAGNOSIS — G4733 Obstructive sleep apnea (adult) (pediatric): Secondary | ICD-10-CM | POA: Insufficient documentation

## 2015-07-20 DIAGNOSIS — Z7982 Long term (current) use of aspirin: Secondary | ICD-10-CM | POA: Insufficient documentation

## 2015-07-20 DIAGNOSIS — E1122 Type 2 diabetes mellitus with diabetic chronic kidney disease: Secondary | ICD-10-CM | POA: Insufficient documentation

## 2015-07-20 DIAGNOSIS — I5022 Chronic systolic (congestive) heart failure: Secondary | ICD-10-CM

## 2015-07-20 DIAGNOSIS — N184 Chronic kidney disease, stage 4 (severe): Secondary | ICD-10-CM | POA: Diagnosis not present

## 2015-07-20 DIAGNOSIS — I2510 Atherosclerotic heart disease of native coronary artery without angina pectoris: Secondary | ICD-10-CM | POA: Insufficient documentation

## 2015-07-20 DIAGNOSIS — Z951 Presence of aortocoronary bypass graft: Secondary | ICD-10-CM

## 2015-07-20 DIAGNOSIS — Z79899 Other long term (current) drug therapy: Secondary | ICD-10-CM

## 2015-07-20 DIAGNOSIS — I5023 Acute on chronic systolic (congestive) heart failure: Secondary | ICD-10-CM | POA: Diagnosis not present

## 2015-07-20 DIAGNOSIS — I130 Hypertensive heart and chronic kidney disease with heart failure and stage 1 through stage 4 chronic kidney disease, or unspecified chronic kidney disease: Secondary | ICD-10-CM | POA: Diagnosis not present

## 2015-07-20 DIAGNOSIS — E785 Hyperlipidemia, unspecified: Secondary | ICD-10-CM

## 2015-07-20 DIAGNOSIS — I255 Ischemic cardiomyopathy: Secondary | ICD-10-CM

## 2015-07-20 LAB — COMPREHENSIVE METABOLIC PANEL
ALK PHOS: 125 U/L (ref 38–126)
ALT: 48 U/L (ref 14–54)
ANION GAP: 9 (ref 5–15)
AST: 24 U/L (ref 15–41)
Albumin: 2.7 g/dL — ABNORMAL LOW (ref 3.5–5.0)
BILIRUBIN TOTAL: 1.3 mg/dL — AB (ref 0.3–1.2)
BUN: 40 mg/dL — ABNORMAL HIGH (ref 6–20)
CALCIUM: 8.8 mg/dL — AB (ref 8.9–10.3)
CO2: 25 mmol/L (ref 22–32)
CREATININE: 1.85 mg/dL — AB (ref 0.44–1.00)
Chloride: 101 mmol/L (ref 101–111)
GFR, EST AFRICAN AMERICAN: 30 mL/min — AB (ref >60)
GFR, EST NON AFRICAN AMERICAN: 26 mL/min — AB (ref >60)
Glucose, Bld: 183 mg/dL — ABNORMAL HIGH (ref 65–99)
Potassium: 4.3 mmol/L (ref 3.5–5.1)
Sodium: 135 mmol/L (ref 135–145)
TOTAL PROTEIN: 6.2 g/dL — AB (ref 6.5–8.1)

## 2015-07-20 LAB — BRAIN NATRIURETIC PEPTIDE: B Natriuretic Peptide: 2211.4 pg/mL — ABNORMAL HIGH (ref 0.0–100.0)

## 2015-07-20 MED ORDER — ISOSORB DINITRATE-HYDRALAZINE 20-37.5 MG PO TABS: 0.5000 | ORAL_TABLET | Freq: Three times a day (TID) | ORAL | Status: DC

## 2015-07-20 NOTE — Patient Outreach (Signed)
Received notice from home office that pt had called in and notified that she needs to cancel and reschedule her appt for today because she has MD visits this week.  I called pt and rescheduled my initial visit for next Wednesday, June 28th at 12:oo noon.  Almetta LovelyCarroll [facility]NP-BC [facility] Care Manager [phone]

## 2015-07-20 NOTE — Progress Notes (Signed)
Patient ID: [name] [name], female   DOB: 11/27/1940, 75 y.o.   MRN: [id number] PCP: [name] [name] Cardiology: Dr. Wyline [name] HF Cardiology: Dr. Shirlee [name]  75 yo with history of CAD s/p CABG, ischemic cardiomyopathy, and CKD presents for CHF clinic evaluation.  She had CABG in 2002.  Last Cardiolite in 8/16 showed extensive areas of infarction with only mild peri-infarct ischemia.    In 4/17, she developed increased exertional dyspnea.  She was admitted at [facility] in 5/17.  Prior to admission, she reports some burning across her upper chest that she thought was heartburn.  At [facility], she was started on dopamine and diuresed.  Echo showed EF < 25%, which was worsened compared to echo in 7/16 with EF 40%.    After discharge, she continued to have dyspnea.  It has worsened.  She is short of breath just walking around her house.  She has orthopnea and must sleep on her side.  Occasional PND.  No further chest pain. Significant lower extremity edema.  She came in a wheelchair. Medication titration has been limited by hypotension and renal dysfunction.   Labs (6/17): K 4.1, creatinine 2.02, ALT 55, AST 38, hgb 11.2  ECG: NSR, LBBB with QRS 158 msec  PMH: 1. OSA: Uses CPAP.  2. CAD: CABG 2002 with SVG-LAD, SVG-OM, SVG-PDA.  - Cardiolite (8/16) with EF 20%, partially reversible anterior/apical defect c/w infarction with peri-infarct ischemia.  Fixed inferior and inferolateral defect consistent with infarction.  3. Chronic systolic CHF: Ischemic cardiomyopathy:  - Echo (7/16) with EF 40%, regional wall motion abnormalities, mild-moderate MR, decreased RV systolic function.  - Echo (5/17) at [facility] with EF < 25%, moderate to severe MR, RV normal.  4. Hyperlipidemia 5. Type II diabetes. 6. CKD: Stage III.   SH: Mar[place], [place], 2 children. Nonsmoker, no ETOH.   Family History  Problem Relation Age of Onset  . Hypertension     ROS: All systems reviewed and negative except as per HPI.    Current Outpatient Prescriptions  Medication Sig Dispense Refill  . aspirin 81 MG tablet Take 81 mg by mouth daily.      Marland Kitchen. atorvastatin (LIPITOR) 40 MG tablet Take 1 tablet by mouth  daily 90 tablet 3  . Cholecalciferol (D-3-5) 5000 UNITS capsule Take 5,000 Units by mouth daily.    . furosemide (LASIX) 40 MG tablet Take 40 mg by mouth 2 (two) times daily.    Marland Kitchen. HUMALOG MIX 75/25 (75-25) 100 UNIT/ML SUSP injection Inject 25 Units into the skin 2 (two) times daily.     . Omega-3 Fatty Acids (FISH OIL) 1200 MG CAPS Take 1,200-2,400 mg by mouth 2 (two) times daily. Take 2 tabs by mouth every morning & 1 tab every evening    . ranitidine (ZANTAC) 150 MG tablet Take 1 tablet by mouth two  times daily 180 tablet 3  . isosorbide-hydrALAZINE (BIDIL) 20-37.5 MG tablet Take 0.5 tablets by mouth 3 (three) times daily. 45 tablet 3  . nitroGLYCERIN (NITROSTAT) 0.4 MG SL tablet Place 1 tablet (0.4 mg total) under the tongue every 5 (five) minutes as needed. (Patient not taking: Reported on 07/20/2015) 25 tablet 3   No current facility-administered medications for this encounter.   BP 108/68 mmHg  Pulse 81  Wt 223 lb (101.152 kg)  SpO2 97% General: NAD Neck: JVP 14-16 cm, no thyromegaly or thyroid nodule.  Lungs: Clear to auscultation bilaterally with normal respiratory effort. CV: Nondisplaced PMI.  Heart regular S1/S2, no  S3/S4, no murmur. 1+ edema to knees bilaterally.  No carotid bruit.  Normal pedal pulses.  Abdomen: Soft, nontender, no hepatosplenomegaly, no distention.  Skin: Intact without lesions or rashes.  Neurologic: Alert and oriented x 3.  Psych: Normal affect. Extremities: No clubbing or cyanosis.  HEENT: Normal.   Assessment/Plan: 1. Chronic systolic CHF: Ischemic cardiomyopathy.  Echo at [facility] in 5/17 with EF < 25%, down from 40% in 7/16.  On exam today, she is markedly volume overloaded with NYHA class IIIb-IV symptoms.  Medical titration limited by low BP and renal dysfunction.   I am concerned for low output heart failure.  - She needs RHC, will arrange for tomorrow.  I suspect that she will need to be admitted after RHC.  - Stop Coreg.  She is only taking it at night, and I am concerned that she may have low output.  - Start Bidil 1/2 tablet tid.  - Will need diuretic adjustment.  Will not change today as she will come for RHC tomorrow and will likely be admitted.  - ECG with LBBB, QRS 158 msec.  Will need to consider CRT-D when more stable.  - If low output on RHC, would start milrinone and diurese.  Long-term, not a transplant candidate.  LVAD would be an option, but renal function will be a confounder.  2. CAD: s/p CABG.  No chest pain since prior to admission at [facility] in 5/17.  Given fall in EF, concerned for worsened CAD. Ideally, she would have coronary angiography.  However, last creatinine was 2 in 6/17.  Will send repeat BMET today.  She will have RHC tomorrow and will likely be admitted for milrinone/diuresis.  If creatinine improves, could likely do coronary angiography at some point in the future.  Continue ASA 81 and atorvastatin.  3. CKD: Stage III.  BMET today.   [name] [name] 07/20/2015    

## 2015-07-20 NOTE — Patient Instructions (Signed)
Stop Carvedilol  Start Bidil 1/2 tab Three times a day   Labs today  Your physician recommends that you schedule a follow-up appointment in: 2-3 weeks

## 2015-07-21 ENCOUNTER — Encounter (HOSPITAL_COMMUNITY)
Admission: AD | Disposition: A | Discharge status: Home Health | Payer: Self-pay | Source: Ambulatory Visit | Attending: Cardiology

## 2015-07-21 ENCOUNTER — Encounter (HOSPITAL_COMMUNITY): Payer: Self-pay | Admitting: Cardiology

## 2015-07-21 ENCOUNTER — Inpatient Hospital Stay (HOSPITAL_COMMUNITY)
Admission: AD | Admit: 2015-07-21 | Discharge: 2015-08-03 | DRG: 286 | Disposition: A | Discharge status: Home Health | Payer: Medicare Other | Source: Ambulatory Visit | Attending: Cardiology | Admitting: Cardiology

## 2015-07-21 DIAGNOSIS — Z8249 Family history of ischemic heart disease and other diseases of the circulatory system: Secondary | ICD-10-CM

## 2015-07-21 DIAGNOSIS — I5023 Acute on chronic systolic (congestive) heart failure: Secondary | ICD-10-CM | POA: Diagnosis present

## 2015-07-21 DIAGNOSIS — N189 Chronic kidney disease, unspecified: Secondary | ICD-10-CM

## 2015-07-21 DIAGNOSIS — I447 Left bundle-branch block, unspecified: Secondary | ICD-10-CM | POA: Diagnosis present

## 2015-07-21 DIAGNOSIS — I130 Hypertensive heart and chronic kidney disease with heart failure and stage 1 through stage 4 chronic kidney disease, or unspecified chronic kidney disease: Secondary | ICD-10-CM | POA: Diagnosis present

## 2015-07-21 DIAGNOSIS — I2510 Atherosclerotic heart disease of native coronary artery without angina pectoris: Secondary | ICD-10-CM | POA: Diagnosis present

## 2015-07-21 DIAGNOSIS — N2889 Other specified disorders of kidney and ureter: Secondary | ICD-10-CM | POA: Diagnosis not present

## 2015-07-21 DIAGNOSIS — N179 Acute kidney failure, unspecified: Secondary | ICD-10-CM

## 2015-07-21 DIAGNOSIS — Z23 Encounter for immunization: Secondary | ICD-10-CM

## 2015-07-21 DIAGNOSIS — I509 Heart failure, unspecified: Secondary | ICD-10-CM

## 2015-07-21 DIAGNOSIS — Z951 Presence of aortocoronary bypass graft: Secondary | ICD-10-CM

## 2015-07-21 DIAGNOSIS — I255 Ischemic cardiomyopathy: Secondary | ICD-10-CM | POA: Diagnosis present

## 2015-07-21 DIAGNOSIS — Z7982 Long term (current) use of aspirin: Secondary | ICD-10-CM | POA: Diagnosis not present

## 2015-07-21 DIAGNOSIS — R570 Cardiogenic shock: Secondary | ICD-10-CM | POA: Diagnosis not present

## 2015-07-21 DIAGNOSIS — J9601 Acute respiratory failure with hypoxia: Secondary | ICD-10-CM | POA: Insufficient documentation

## 2015-07-21 DIAGNOSIS — Z87891 Personal history of nicotine dependence: Secondary | ICD-10-CM

## 2015-07-21 DIAGNOSIS — Z79899 Other long term (current) drug therapy: Secondary | ICD-10-CM

## 2015-07-21 DIAGNOSIS — I454 Nonspecific intraventricular block: Secondary | ICD-10-CM | POA: Diagnosis present

## 2015-07-21 DIAGNOSIS — N183 Chronic kidney disease, stage 3 unspecified: Secondary | ICD-10-CM | POA: Insufficient documentation

## 2015-07-21 DIAGNOSIS — E1122 Type 2 diabetes mellitus with diabetic chronic kidney disease: Secondary | ICD-10-CM | POA: Diagnosis present

## 2015-07-21 DIAGNOSIS — E876 Hypokalemia: Secondary | ICD-10-CM | POA: Diagnosis not present

## 2015-07-21 DIAGNOSIS — I472 Ventricular tachycardia, unspecified: Secondary | ICD-10-CM | POA: Insufficient documentation

## 2015-07-21 DIAGNOSIS — E8770 Fluid overload, unspecified: Secondary | ICD-10-CM | POA: Diagnosis present

## 2015-07-21 DIAGNOSIS — N184 Chronic kidney disease, stage 4 (severe): Secondary | ICD-10-CM | POA: Diagnosis present

## 2015-07-21 DIAGNOSIS — I214 Non-ST elevation (NSTEMI) myocardial infarction: Secondary | ICD-10-CM | POA: Insufficient documentation

## 2015-07-21 HISTORY — PX: CARDIAC CATHETERIZATION: SHX172

## 2015-07-21 LAB — POCT I-STAT 3, VENOUS BLOOD GAS (G3P V)
Acid-Base Excess: 1 mmol/L (ref 0.0–2.0)
Acid-Base Excess: 2 mmol/L (ref 0.0–2.0)
BICARBONATE: 24.8 meq/L — AB (ref 20.0–24.0)
Bicarbonate: 25.8 mEq/L — ABNORMAL HIGH (ref 20.0–24.0)
O2 Saturation: 62 %
O2 Saturation: 62 %
PCO2 VEN: 37.9 mmHg — AB (ref 45.0–50.0)
PH VEN: 7.439 — AB (ref 7.250–7.300)
PH VEN: 7.442 — AB (ref 7.250–7.300)
PO2 VEN: 31 mmHg (ref 31.0–45.0)
PO2 VEN: 31 mmHg (ref 31.0–45.0)
TCO2: 26 mmol/L (ref 0–100)
TCO2: 27 mmol/L (ref 0–100)
pCO2, Ven: 36.6 mmHg — ABNORMAL LOW (ref 45.0–50.0)

## 2015-07-21 LAB — PROTIME-INR
INR: 1.19 (ref 0.00–1.49)
PROTHROMBIN TIME: 15.3 s — AB (ref 11.6–15.2)

## 2015-07-21 LAB — GLUCOSE, CAPILLARY
GLUCOSE-CAPILLARY: 123 mg/dL — AB (ref 65–99)
GLUCOSE-CAPILLARY: 311 mg/dL — AB (ref 65–99)
Glucose-Capillary: 120 mg/dL — ABNORMAL HIGH (ref 65–99)
Glucose-Capillary: 245 mg/dL — ABNORMAL HIGH (ref 65–99)
Glucose-Capillary: 214 mg/dL — ABNORMAL HIGH (ref 65–99)

## 2015-07-21 LAB — CBC WITH DIFFERENTIAL/PLATELET
Basophils Absolute: 0 10*3/uL (ref 0.0–0.1)
Basophils Relative: 0 %
Eosinophils Absolute: 0.2 10*3/uL (ref 0.0–0.7)
Eosinophils Relative: 4 %
HCT: 34.9 % — ABNORMAL LOW (ref 36.0–46.0)
HEMOGLOBIN: 10.7 g/dL — AB (ref 12.0–15.0)
LYMPHS ABS: 1.3 10*3/uL (ref 0.7–4.0)
LYMPHS PCT: 34 %
MCH: 29.3 pg (ref 26.0–34.0)
MCHC: 30.7 g/dL (ref 30.0–36.0)
MCV: 95.6 fL (ref 78.0–100.0)
MONOS PCT: 13 %
Monocytes Absolute: 0.5 10*3/uL (ref 0.1–1.0)
NEUTROS PCT: 49 %
Neutro Abs: 1.8 10*3/uL (ref 1.7–7.7)
Platelets: 133 10*3/uL — ABNORMAL LOW (ref 150–400)
RBC: 3.65 MIL/uL — AB (ref 3.87–5.11)
RDW: 14.8 % (ref 11.5–15.5)
WBC: 3.6 10*3/uL — AB (ref 4.0–10.5)

## 2015-07-21 LAB — COMPREHENSIVE METABOLIC PANEL
ALK PHOS: 126 U/L (ref 38–126)
ALT: 41 U/L (ref 14–54)
AST: 21 U/L (ref 15–41)
Albumin: 2.9 g/dL — ABNORMAL LOW (ref 3.5–5.0)
Anion gap: 9 (ref 5–15)
BILIRUBIN TOTAL: 1.2 mg/dL (ref 0.3–1.2)
BUN: 39 mg/dL — ABNORMAL HIGH (ref 6–20)
CALCIUM: 8.9 mg/dL (ref 8.9–10.3)
CO2: 26 mmol/L (ref 22–32)
CREATININE: 1.86 mg/dL — AB (ref 0.44–1.00)
Chloride: 101 mmol/L (ref 101–111)
GFR, EST AFRICAN AMERICAN: 30 mL/min — AB (ref >60)
GFR, EST NON AFRICAN AMERICAN: 26 mL/min — AB (ref >60)
Glucose, Bld: 258 mg/dL — ABNORMAL HIGH (ref 65–99)
Potassium: 3.9 mmol/L (ref 3.5–5.1)
Sodium: 136 mmol/L (ref 135–145)
TOTAL PROTEIN: 6.4 g/dL — AB (ref 6.5–8.1)

## 2015-07-21 LAB — MRSA PCR SCREENING: MRSA by PCR: NEGATIVE

## 2015-07-21 LAB — TSH: TSH: 1.223 u[IU]/mL (ref 0.350–4.500)

## 2015-07-21 LAB — BRAIN NATRIURETIC PEPTIDE: B NATRIURETIC PEPTIDE 5: 1795.7 pg/mL — AB (ref 0.0–100.0)

## 2015-07-21 SURGERY — RIGHT HEART CATH

## 2015-07-21 MED ORDER — MIDAZOLAM HCL 2 MG/2ML IJ SOLN
INTRAMUSCULAR | Status: AC
  Filled 2015-07-21: qty 2

## 2015-07-21 MED ORDER — SODIUM CHLORIDE 0.9% FLUSH
10.0000 mL | INTRAVENOUS | Status: DC | PRN
Start: 2015-07-21 — End: 2015-08-03

## 2015-07-21 MED ORDER — ACETAMINOPHEN 325 MG PO TABS
650.0000 mg | ORAL_TABLET | ORAL | Status: DC | PRN
  Administered 2015-08-02: 650 mg via ORAL
  Filled 2015-07-21: qty 2

## 2015-07-21 MED ORDER — ISOSORB DINITRATE-HYDRALAZINE 20-37.5 MG PO TABS
0.5000 | ORAL_TABLET | Freq: Three times a day (TID) | ORAL | Status: DC
  Administered 2015-07-21 – 2015-08-03 (×35): 0.5 via ORAL
  Filled 2015-07-21 (×36): qty 1

## 2015-07-21 MED ORDER — MIDAZOLAM HCL 2 MG/2ML IJ SOLN
INTRAMUSCULAR | Status: DC | PRN
  Administered 2015-07-21: 0.5 mg via INTRAVENOUS

## 2015-07-21 MED ORDER — HEPARIN SODIUM (PORCINE) 5000 UNIT/ML IJ SOLN
5000.0000 [IU] | Freq: Three times a day (TID) | INTRAMUSCULAR | Status: DC
  Administered 2015-07-21 – 2015-08-03 (×40): 5000 [IU] via SUBCUTANEOUS
  Filled 2015-07-21 (×40): qty 1

## 2015-07-21 MED ORDER — LIDOCAINE HCL (PF) 1 % IJ SOLN
INTRAMUSCULAR | Status: AC
  Filled 2015-07-21: qty 30

## 2015-07-21 MED ORDER — SODIUM CHLORIDE 0.9% FLUSH: 3.0000 mL | INTRAVENOUS | Status: DC | PRN

## 2015-07-21 MED ORDER — FUROSEMIDE 10 MG/ML IJ SOLN
15.0000 mg/h | INTRAVENOUS | Status: DC
  Administered 2015-07-21 – 2015-07-22 (×2): 12 mg/h via INTRAVENOUS
  Administered 2015-07-23 – 2015-07-24 (×3): 15 mg/h via INTRAVENOUS
  Filled 2015-07-21 (×8): qty 25

## 2015-07-21 MED ORDER — INSULIN ASPART 100 UNIT/ML ~~LOC~~ SOLN
0.0000 [IU] | Freq: Three times a day (TID) | SUBCUTANEOUS | Status: DC
  Administered 2015-07-21: 5 [IU] via SUBCUTANEOUS
  Administered 2015-07-22: 11 [IU] via SUBCUTANEOUS
  Administered 2015-07-22: 2 [IU] via SUBCUTANEOUS
  Administered 2015-07-22: 3 [IU] via SUBCUTANEOUS
  Administered 2015-07-23 (×2): 8 [IU] via SUBCUTANEOUS
  Administered 2015-07-23: 2 [IU] via SUBCUTANEOUS
  Administered 2015-07-24: 5 [IU] via SUBCUTANEOUS
  Administered 2015-07-24: 8 [IU] via SUBCUTANEOUS
  Administered 2015-07-24 – 2015-07-25 (×2): 3 [IU] via SUBCUTANEOUS
  Administered 2015-07-25: 5 [IU] via SUBCUTANEOUS
  Administered 2015-07-26: 2 [IU] via SUBCUTANEOUS
  Administered 2015-07-26 (×2): 5 [IU] via SUBCUTANEOUS
  Administered 2015-07-27: 2 [IU] via SUBCUTANEOUS
  Administered 2015-07-27: 3 [IU] via SUBCUTANEOUS
  Administered 2015-07-28: 8 [IU] via SUBCUTANEOUS
  Administered 2015-07-28: 3 [IU] via SUBCUTANEOUS
  Administered 2015-07-28 – 2015-07-29 (×2): 5 [IU] via SUBCUTANEOUS
  Administered 2015-07-29: 3 [IU] via SUBCUTANEOUS
  Administered 2015-07-29: 2 [IU] via SUBCUTANEOUS
  Administered 2015-07-30: 8 [IU] via SUBCUTANEOUS
  Administered 2015-07-30: 5 [IU] via SUBCUTANEOUS
  Administered 2015-07-31 (×2): 8 [IU] via SUBCUTANEOUS
  Administered 2015-07-31: 3 [IU] via SUBCUTANEOUS
  Administered 2015-08-01: 2 [IU] via SUBCUTANEOUS
  Administered 2015-08-01 (×2): 5 [IU] via SUBCUTANEOUS
  Administered 2015-08-02: 3 [IU] via SUBCUTANEOUS
  Administered 2015-08-02: 2 [IU] via SUBCUTANEOUS
  Administered 2015-08-02: 3 [IU] via SUBCUTANEOUS
  Administered 2015-08-03: 2 [IU] via SUBCUTANEOUS
  Administered 2015-08-03: 8 [IU] via SUBCUTANEOUS

## 2015-07-21 MED ORDER — MILRINONE LACTATE IN DEXTROSE 20-5 MG/100ML-% IV SOLN
0.1250 ug/kg/min | INTRAVENOUS | Status: DC
  Administered 2015-07-21 – 2015-07-28 (×13): 0.25 ug/kg/min via INTRAVENOUS
  Administered 2015-07-29: 0.125 ug/kg/min via INTRAVENOUS
  Filled 2015-07-21 (×14): qty 100

## 2015-07-21 MED ORDER — SODIUM CHLORIDE 0.9% FLUSH: 3.0000 mL | Freq: Two times a day (BID) | INTRAVENOUS | Status: DC

## 2015-07-21 MED ORDER — FUROSEMIDE 10 MG/ML IJ SOLN
40.0000 mg | Freq: Once | INTRAMUSCULAR | Status: AC
  Administered 2015-07-21: 40 mg via INTRAVENOUS
  Filled 2015-07-21: qty 4

## 2015-07-21 MED ORDER — INSULIN ASPART 100 UNIT/ML ~~LOC~~ SOLN
0.0000 [IU] | Freq: Every day | SUBCUTANEOUS | Status: DC
  Administered 2015-07-21: 4 [IU] via SUBCUTANEOUS
  Administered 2015-07-24: 2 [IU] via SUBCUTANEOUS
  Administered 2015-08-01: 3 [IU] via SUBCUTANEOUS

## 2015-07-21 MED ORDER — PNEUMOCOCCAL VAC POLYVALENT 25 MCG/0.5ML IJ INJ
0.5000 mL | INJECTION | INTRAMUSCULAR | Status: AC
  Administered 2015-07-22: 0.5 mL via INTRAMUSCULAR
  Filled 2015-07-21: qty 0.5

## 2015-07-21 MED ORDER — FENTANYL CITRATE (PF) 100 MCG/2ML IJ SOLN
INTRAMUSCULAR | Status: AC
  Filled 2015-07-21: qty 2

## 2015-07-21 MED ORDER — ASPIRIN EC 81 MG PO TBEC
81.0000 mg | DELAYED_RELEASE_TABLET | Freq: Every day | ORAL | Status: DC
  Administered 2015-07-22 – 2015-08-03 (×13): 81 mg via ORAL
  Filled 2015-07-21 (×14): qty 1

## 2015-07-21 MED ORDER — DIGOXIN 125 MCG PO TABS
0.1250 mg | ORAL_TABLET | Freq: Every day | ORAL | Status: DC
  Administered 2015-07-21 – 2015-07-25 (×5): 0.125 mg via ORAL
  Filled 2015-07-21 (×5): qty 1

## 2015-07-21 MED ORDER — INSULIN ASPART PROT & ASPART (70-30 MIX) 100 UNIT/ML ~~LOC~~ SUSP
20.0000 [IU] | Freq: Two times a day (BID) | SUBCUTANEOUS | Status: DC
  Administered 2015-07-21 – 2015-07-22 (×3): 20 [IU] via SUBCUTANEOUS
  Filled 2015-07-21 (×2): qty 10

## 2015-07-21 MED ORDER — HEPARIN (PORCINE) IN NACL 2-0.9 UNIT/ML-% IJ SOLN
INTRAMUSCULAR | Status: AC
  Filled 2015-07-21: qty 500

## 2015-07-21 MED ORDER — HEPARIN (PORCINE) IN NACL 2-0.9 UNIT/ML-% IJ SOLN
INTRAMUSCULAR | Status: DC | PRN
  Administered 2015-07-21: 09:00:00

## 2015-07-21 MED ORDER — LIDOCAINE HCL (PF) 1 % IJ SOLN
INTRAMUSCULAR | Status: DC | PRN
  Administered 2015-07-21: 2 mL

## 2015-07-21 MED ORDER — ATORVASTATIN CALCIUM 40 MG PO TABS
40.0000 mg | ORAL_TABLET | Freq: Every day | ORAL | Status: DC
  Administered 2015-07-21 – 2015-08-02 (×13): 40 mg via ORAL
  Filled 2015-07-21 (×15): qty 1

## 2015-07-21 MED ORDER — SODIUM CHLORIDE 0.9 % IV SOLN: 250.0000 mL | INTRAVENOUS | Status: DC | PRN

## 2015-07-21 MED ORDER — SODIUM CHLORIDE 0.9 % IV SOLN
250.0000 mL | INTRAVENOUS | Status: DC | PRN
  Administered 2015-07-21: 250 mL via INTRAVENOUS

## 2015-07-21 MED ORDER — FENTANYL CITRATE (PF) 100 MCG/2ML IJ SOLN
INTRAMUSCULAR | Status: DC | PRN
  Administered 2015-07-21: 25 ug via INTRAVENOUS

## 2015-07-21 MED ORDER — SODIUM CHLORIDE 0.9% FLUSH
3.0000 mL | Freq: Two times a day (BID) | INTRAVENOUS | Status: DC
  Administered 2015-07-21 – 2015-08-03 (×11): 3 mL via INTRAVENOUS

## 2015-07-21 MED ORDER — SODIUM CHLORIDE 0.9 % IV SOLN
INTRAVENOUS | Status: DC
  Administered 2015-07-21: 08:00:00 via INTRAVENOUS

## 2015-07-21 MED ORDER — ONDANSETRON HCL 4 MG/2ML IJ SOLN: 4.0000 mg | Freq: Four times a day (QID) | INTRAMUSCULAR | Status: DC | PRN

## 2015-07-21 MED ORDER — SODIUM CHLORIDE 0.9% FLUSH
10.0000 mL | Freq: Two times a day (BID) | INTRAVENOUS | Status: DC
  Administered 2015-07-21 – 2015-07-28 (×9): 10 mL
  Administered 2015-07-29: 30 mL
  Administered 2015-07-29 – 2015-08-03 (×7): 10 mL

## 2015-07-21 SURGICAL SUPPLY — 7 items
CATH BALLN WEDGE 5F 110CM (CATHETERS) ×2 IMPLANT
PACK CARDIAC CATHETERIZATION (CUSTOM PROCEDURE TRAY) ×3 IMPLANT
SHEATH FAST CATH BRACH 5F 5CM (SHEATH) ×2 IMPLANT
TRANSDUCER W/STOPCOCK (MISCELLANEOUS) ×4 IMPLANT
TUBING ART PRESS 72  MALE/FEM (TUBING) ×2
TUBING ART PRESS 72 MALE/FEM (TUBING) IMPLANT
WIRE EMERALD 3MM-J .025X260CM (WIRE) ×2 IMPLANT

## 2015-07-21 NOTE — H&P (View-Only) (Signed)
Patient ID: [name] [name], female   DOB: 11/27/1940, 75 y.o.   MRN: [id number] PCP: Gracelyn NurseKilby Cardiology: Dr. Wyline MoodBranch HF Cardiology: Dr. Shirlee LatchMcLean  75 yo with history of CAD s/p CABG, ischemic cardiomyopathy, and CKD presents for CHF clinic evaluation.  She had CABG in 2002.  Last Cardiolite in 8/16 showed extensive areas of infarction with only mild peri-infarct ischemia.    In 4/17, she developed increased exertional dyspnea.  She was admitted at [facility] in 5/17.  Prior to admission, she reports some burning across her upper chest that she thought was heartburn.  At [facility], she was started on dopamine and diuresed.  Echo showed EF < 25%, which was worsened compared to echo in 7/16 with EF 40%.    After discharge, she continued to have dyspnea.  It has worsened.  She is short of breath just walking around her house.  She has orthopnea and must sleep on her side.  Occasional PND.  No further chest pain. Significant lower extremity edema.  She came in a wheelchair. Medication titration has been limited by hypotension and renal dysfunction.   Labs (6/17): K 4.1, creatinine 2.02, ALT 55, AST 38, hgb 11.2  ECG: NSR, LBBB with QRS 158 msec  PMH: 1. OSA: Uses CPAP.  2. CAD: CABG 2002 with SVG-LAD, SVG-OM, SVG-PDA.  - Cardiolite (8/16) with EF 20%, partially reversible anterior/apical defect c/w infarction with peri-infarct ischemia.  Fixed inferior and inferolateral defect consistent with infarction.  3. Chronic systolic CHF: Ischemic cardiomyopathy:  - Echo (7/16) with EF 40%, regional wall motion abnormalities, mild-moderate MR, decreased RV systolic function.  - Echo (5/17) at [facility] with EF < 25%, moderate to severe MR, RV normal.  4. Hyperlipidemia 5. Type II diabetes. 6. CKD: Stage III.   SH: Married, [place] in [place], 2 children. Nonsmoker, no ETOH.   Family History  Problem Relation Age of Onset  . Hypertension     ROS: All systems reviewed and negative except as per HPI.    Current Outpatient Prescriptions  Medication Sig Dispense Refill  . aspirin 81 MG tablet Take 81 mg by mouth daily.      Marland Kitchen. atorvastatin (LIPITOR) 40 MG tablet Take 1 tablet by mouth  daily 90 tablet 3  . Cholecalciferol (D-3-5) 5000 UNITS capsule Take 5,000 Units by mouth daily.    . furosemide (LASIX) 40 MG tablet Take 40 mg by mouth 2 (two) times daily.    Marland Kitchen. HUMALOG MIX 75/25 (75-25) 100 UNIT/ML SUSP injection Inject 25 Units into the skin 2 (two) times daily.     . Omega-3 Fatty Acids (FISH OIL) 1200 MG CAPS Take 1,200-2,400 mg by mouth 2 (two) times daily. Take 2 tabs by mouth every morning & 1 tab every evening    . ranitidine (ZANTAC) 150 MG tablet Take 1 tablet by mouth two  times daily 180 tablet 3  . isosorbide-hydrALAZINE (BIDIL) 20-37.5 MG tablet Take 0.5 tablets by mouth 3 (three) times daily. 45 tablet 3  . nitroGLYCERIN (NITROSTAT) 0.4 MG SL tablet Place 1 tablet (0.4 mg total) under the tongue every 5 (five) minutes as needed. (Patient not taking: Reported on 07/20/2015) 25 tablet 3   No current facility-administered medications for this encounter.   BP 108/68 mmHg  Pulse 81  Wt 223 lb (101.152 kg)  SpO2 97% General: NAD Neck: JVP 14-16 cm, no thyromegaly or thyroid nodule.  Lungs: Clear to auscultation bilaterally with normal respiratory effort. CV: Nondisplaced PMI.  Heart regular S1/S2, no  S3/S4, no murmur. 1+ edema to knees bilaterally.  No carotid bruit.  Normal pedal pulses.  Abdomen: Soft, nontender, no hepatosplenomegaly, no distention.  Skin: Intact without lesions or rashes.  Neurologic: Alert and oriented x 3.  Psych: Normal affect. Extremities: No clubbing or cyanosis.  HEENT: Normal.   Assessment/Plan: 1. Chronic systolic CHF: Ischemic cardiomyopathy.  Echo [facility] in [facility]/17 with EF < 25%, down from 40% in 7/16.  On exam today, she is markedly volume overloaded with NYHA class IIIb-IV symptoms.  Medical titration limited by low BP and renal dysfunction.   I am concerned for low output heart failure.  - She needs RHC, will arrange for tomorrow.  I suspect that she will need to be admitted after RHC.  - Stop Coreg.  She is only taking it at night, and I am concerned that she may have low output.  - Start Bidil 1/2 tablet tid.  - Will need diuretic adjustment.  Will not change today as she will come for RHC tomorrow and will likely be admitted.  - ECG with LBBB, QRS 158 msec.  Will need to consider CRT-D when more stable.  - If low output on RHC, would start milrinone and diurese.  Long-term, not a transplant candidate.  LVAD would be an option, but renal function will be a confounder.  2. CAD: s/p CABG.  No chest pain since prior to admission at [facility] in [facility]/17.  Given fall in EF, concerned for worsened CAD. Ideally, she would have coronary angiography.  However, last creatinine was 2 in 6/17.  Will send repeat BMET today.  She will have RHC tomorrow and will likely be admitted for milrinone/diuresis.  If creatinine improves, could likely do coronary angiography at some point in the future.  Continue ASA 81 and atorvastatin.  3. CKD: Stage III.  BMET today.   Marca AnconaDalton Carine Nordgren 07/20/2015

## 2015-07-21 NOTE — Care Management Note (Addendum)
Case Management Note  Patient Details  Name: [name] [name] MRN: [id number] Date of Birth: 07/21/1940  Subjective/Objective:        Adm w chf            Action/Plan:lives at home   Expected Discharge Date:                  Expected Discharge Plan:  Home w Home Health Services  In-House Referral:     Discharge planning Services  CM Consult  Post Acute Care Choice:    Choice offered to:     DME Arranged:    DME Agency:     HH Arranged:    HH Agency:     Status of Service:     If discussed at [employer] of [employer] Meetings, dates discussed:    Additional Comments: act w [facility], ur review done. Will follow. Alerted donna w [facility] of pt's adm.  Hanley Haysowell, Marquasha Brutus T, RN 07/21/2015, 3:07 PM

## 2015-07-21 NOTE — Interval H&P Note (Signed)
History and Physical Interval Note:  07/21/2015 8:46 AM  [name] [name]  has presented today for surgery, with the diagnosis of hf  The various methods of treatment have been discussed with the patient and family. After consideration of risks, benefits and other options for treatment, the patient has consented to  Procedure(s): Right Heart Cath (N/A) as a surgical intervention .  The patient's history has been reviewed, patient examined, no change in status, stable for surgery.  I have reviewed the patient's chart and labs.  Questions were answered to the patient's satisfaction.     Cecelia Graciano [employer]

## 2015-07-21 NOTE — Progress Notes (Signed)
Family in to visit. Eating [employer] lunch box. Waiting on TCU bed.

## 2015-07-21 NOTE — Progress Notes (Addendum)
Spoke with Tonye BecketAmy Clegg, NP.  Pt has poor IV access, only 1 PIV and poor vasculature.  PICC to be placed today, however [facility] and it will be this evening before PICC can be placed.  Pt has orders for milrinone drip and lasix drip but they are not compatible.  Per Amy it is ok to wait to start milrinone until PICC is placed this evening.  Will start Lasix drip and continue to monitor.  Amy Filbert SchilderClegg spoke with Dr Shirlee LatchMcLean and called back and asked for additional PIV to be placed and milrinone started now before PICC is placed.  Attempted to place IV, unable, will consult IV team.

## 2015-07-21 NOTE — Progress Notes (Signed)
[facility]  Patient Status: Active (receiving services up to time of hospitalization)  [facility] is providing the following services: RN, PT, OT and HHA  If patient discharges after hours, please call [phone].   [name] FurnishDonna [name] 07/21/2015, 3:21 PM

## 2015-07-21 NOTE — Progress Notes (Signed)
Sleeping

## 2015-07-21 NOTE — Progress Notes (Signed)
Peripherally Inserted Central Catheter/Midline Placement  The IV Nurse has discussed with the patient and/or persons authorized to consent for the patient, the purpose of this procedure and the potential benefits and risks involved with this procedure.  The benefits include less needle sticks, lab draws from the catheter and patient may be discharged home with the catheter.  Risks include, but not limited to, infection, bleeding, blood clot (thrombus formation), and puncture of an artery; nerve damage and irregular heat beat.  Alternatives to this procedure were also discussed.  PICC/Midline Placement Documentation  PICC Double Lumen 07/21/15 PICC Right Basilic 41 cm 2 cm (Active)  Indication for Insertion or Continuance of Line Vasoactive infusions 07/21/2015  6:00 PM  Exposed Catheter (cm) 2 cm 07/21/2015  6:00 PM  Dressing Change Due 07/28/15 07/21/2015  6:00 PM       Stacie GlazeJoyce, Jaquann Guarisco Horton 07/21/2015, 6:30 PM

## 2015-07-22 ENCOUNTER — Inpatient Hospital Stay (HOSPITAL_COMMUNITY): Payer: Medicare Other

## 2015-07-22 DIAGNOSIS — I130 Hypertensive heart and chronic kidney disease with heart failure and stage 1 through stage 4 chronic kidney disease, or unspecified chronic kidney disease: Secondary | ICD-10-CM | POA: Diagnosis not present

## 2015-07-22 DIAGNOSIS — N183 Chronic kidney disease, stage 3 unspecified: Secondary | ICD-10-CM | POA: Insufficient documentation

## 2015-07-22 DIAGNOSIS — I509 Heart failure, unspecified: Secondary | ICD-10-CM

## 2015-07-22 LAB — GLUCOSE, CAPILLARY
GLUCOSE-CAPILLARY: 189 mg/dL — AB (ref 65–99)
Glucose-Capillary: 313 mg/dL — ABNORMAL HIGH (ref 65–99)
Glucose-Capillary: 129 mg/dL — ABNORMAL HIGH (ref 65–99)
Glucose-Capillary: 103 mg/dL — ABNORMAL HIGH (ref 65–99)

## 2015-07-22 LAB — CBC
HCT: 31.4 % — ABNORMAL LOW (ref 36.0–46.0)
HEMOGLOBIN: 9.7 g/dL — AB (ref 12.0–15.0)
MCH: 29.4 pg (ref 26.0–34.0)
MCHC: 30.9 g/dL (ref 30.0–36.0)
MCV: 95.2 fL (ref 78.0–100.0)
Platelets: 128 10*3/uL — ABNORMAL LOW (ref 150–400)
RBC: 3.3 MIL/uL — ABNORMAL LOW (ref 3.87–5.11)
RDW: 14.8 % (ref 11.5–15.5)
WBC: 4.6 10*3/uL (ref 4.0–10.5)

## 2015-07-22 LAB — ECHOCARDIOGRAM COMPLETE
CHL CUP RV SYS PRESS: 54 mmHg
CHL CUP STROKE VOLUME: 35 mL
E decel time: 130 msec
E/e' ratio: 9.58
FS: 14 % — AB (ref 28–44)
HEIGHTINCHES: 62 in
IV/PV OW: 1.2
LA ID, A-P, ES: 41 mm
LA vol A4C: 68 ml
LA vol index: 35 mL/m2
LA vol: 75.2 mL
LADIAMINDEX: 1.91 cm/m2
LEFT ATRIUM END SYS DIAM: 41 mm
LV E/e' medial: 9.58
LV E/e'average: 9.58
LV TDI E'LATERAL: 11.9
LV e' LATERAL: 11.9 cm/s
LVDIAVOL: 191 mL — AB (ref 46–106)
LVDIAVOLIN: 89 mL/m2
LVOT VTI: 14 cm
LVOT area: 2.84 cm2
LVOT peak vel: 88.7 cm/s
LVOTD: 19 mm
LVOTSV: 40 mL
LVSYSVOL: 156 mL — AB (ref 14–42)
LVSYSVOLIN: 73 mL/m2
MRPISAEROA: 0.09 cm2
MV Dec: 130
MV Peak grad: 5 mmHg
MV VTI: 109 cm
MV pk E vel: 114 m/s
MVPKAVEL: 67.3 m/s
PW: 8.3 mm — AB (ref 0.6–1.1)
RV LATERAL S' VELOCITY: 5.98 cm/s
RV TAPSE: 8.26 mm
Reg peak vel: 314 cm/s
Simpson's disk: 18
TDI e' medial: 5.44
TRMAXVEL: 314 cm/s
WEIGHTICAEL: 3534.41 [oz_av]

## 2015-07-22 LAB — CARBOXYHEMOGLOBIN
CARBOXYHEMOGLOBIN: 2 % — AB (ref 0.5–1.5)
METHEMOGLOBIN: 0.8 % (ref 0.0–1.5)
O2 Saturation: 60.9 %
Total hemoglobin: 9.4 g/dL — ABNORMAL LOW (ref 12.0–16.0)

## 2015-07-22 LAB — BASIC METABOLIC PANEL
ANION GAP: 7 (ref 5–15)
BUN: 37 mg/dL — ABNORMAL HIGH (ref 6–20)
CHLORIDE: 103 mmol/L (ref 101–111)
CO2: 26 mmol/L (ref 22–32)
Calcium: 8.5 mg/dL — ABNORMAL LOW (ref 8.9–10.3)
Creatinine, Ser: 1.84 mg/dL — ABNORMAL HIGH (ref 0.44–1.00)
GFR calc Af Amer: 30 mL/min — ABNORMAL LOW (ref >60)
GFR, EST NON AFRICAN AMERICAN: 26 mL/min — AB (ref >60)
GLUCOSE: 308 mg/dL — AB (ref 65–99)
POTASSIUM: 3.7 mmol/L (ref 3.5–5.1)
Sodium: 136 mmol/L (ref 135–145)

## 2015-07-22 MED ORDER — POTASSIUM CHLORIDE CRYS ER 20 MEQ PO TBCR
40.0000 meq | EXTENDED_RELEASE_TABLET | Freq: Two times a day (BID) | ORAL | Status: DC
  Administered 2015-07-22 – 2015-07-28 (×14): 40 meq via ORAL
  Filled 2015-07-22 (×14): qty 2

## 2015-07-22 MED ORDER — METOLAZONE 2.5 MG PO TABS
2.5000 mg | ORAL_TABLET | Freq: Once | ORAL | Status: AC
  Administered 2015-07-22: 2.5 mg via ORAL
  Filled 2015-07-22: qty 1

## 2015-07-22 MED ORDER — POTASSIUM CHLORIDE CRYS ER 20 MEQ PO TBCR: 40.0000 meq | EXTENDED_RELEASE_TABLET | Freq: Once | ORAL | Status: DC

## 2015-07-22 MED ORDER — PERFLUTREN LIPID MICROSPHERE
1.0000 mL | INTRAVENOUS | Status: AC | PRN
  Administered 2015-07-22: 2 mL via INTRAVENOUS
  Filled 2015-07-22: qty 10

## 2015-07-22 NOTE — Progress Notes (Signed)
  Echocardiogram 2D Echocardiogram has been performed.  Janalyn HarderWest, Josey Dettmann R 07/22/2015, 3:58 PM

## 2015-07-22 NOTE — Progress Notes (Signed)
CARDIAC REHAB PHASE I   PRE:  Rate/Rhythm: 96 SR c/ PVCs  BP:  Sitting: 91/57        SaO2: 100 RA  MODE:  Ambulation: 84 ft   POST:  Rate/Rhythm: 120 ST c/ PVCs  BP:  Sitting: 98/62         SaO2: 100 RA  Pt ambulated 84 ft on RA, rolling walker, IV, assist x2 (for safety/equipment), slow, mostly steady gait, tolerated fair. Pt c/o moderate DOE, standing rest x1. VSS. Pt demonstrates limited activity tolerance, however, is motivated to walk. Pt to recliner after walk, feet elevated, call bell within reach. Will continue to follow. Can be x1.   [id number] Joylene GrapesEmily C Shadow Stiggers, RN, BSN 07/22/2015 1:51 PM

## 2015-07-22 NOTE — Progress Notes (Signed)
Inpatient Diabetes Program Recommendations  AACE/ADA: New Consensus Statement on Inpatient Glycemic Control (2015)  Target Ranges:  Prepandial:   less than 140 mg/dL      Peak postprandial:   less than 180 mg/dL (1-2 hours)      Critically ill patients:  140 - 180 mg/dL  Results for Holdman, Conswella C (MRN [id number]) asHarriett Sine of 07/22/2015 09:17  Ref. Range 07/21/2015 14:51 07/21/2015 17:21 07/21/2015 21:50 07/22/2015 08:59  Glucose-Capillary Latest Ref Range: 65-99 mg/dL 409245 (H) 811214 (H) 914311 (H) 313 (H)   Review of Glycemic Control  Diabetes history: DM2 Outpatient Diabetes medications: 75/25 25 units bid Current orders for Inpatient glycemic control: 70/30 insulin 20 units bid + Novolog correction 0-15 units tid + 0-5 units hs  Inpatient Diabetes Program Recommendations:    Please consider increase in 70/30 insulin to 25 units bid.  Thank you, Billy FischerJudy E. Johsua Shevlin, RN, MSN, CDE Inpatient Glycemic Control Team Team Pager [phone] (8am-5pm) 07/22/2015 9:28 AM

## 2015-07-22 NOTE — Care Management Important Message (Signed)
Important Message  Patient Details  Name: [name] [name] MRN: [id number] Date of Birth: 09/21/1940   Medicare Important Message Given:  Yes    Bernadette HoitShoffner, Micaylah Bertucci Coleman 07/22/2015, 9:43 AM

## 2015-07-22 NOTE — Progress Notes (Signed)
Patient ID: [name] [name], female   DOB: 12/18/1940, 75 y.o.   MRN: [id number]   SUBJECTIVE: Some diuresis yesterday, milrinone/Lasix gtt started last night.  Now with PICC.  Co-ox 61% with CVP about 16.   RHC Procedural Findings (07/21/15): Hemodynamics (mmHg) RA mean 17 RV 51/21 PA 57/32, mean 45 PCWP mean 27  Oxygen saturations: PA 62% AO 100%  Cardiac Output (Fick) 4.5  Cardiac Index (Fick) 2.3 PVR 4 WU      Scheduled Meds: . aspirin EC  81 mg Oral Daily  . atorvastatin  40 mg Oral q1800  . digoxin  0.125 mg Oral Daily  . heparin  5,000 Units Subcutaneous Q8H  . insulin aspart  0-15 Units Subcutaneous TID WC  . insulin aspart  0-5 Units Subcutaneous QHS  . insulin aspart protamine- aspart  20 Units Subcutaneous BID WC  . isosorbide-hydrALAZINE  0.5 tablet Oral TID  . metolazone  2.5 mg Oral Once  . pneumococcal 23 valent vaccine  0.5 mL Intramuscular Tomorrow-1000  . potassium chloride  40 mEq Oral BID  . sodium chloride flush  10-40 mL Intracatheter Q12H  . sodium chloride flush  3 mL Intravenous Q12H   Continuous Infusions: . furosemide (LASIX) infusion 12 mg/hr (07/22/15 0538)  . milrinone 0.25 mcg/kg/min (07/22/15 0538)   PRN Meds:.sodium chloride, acetaminophen, ondansetron (ZOFRAN) IV, sodium chloride flush, sodium chloride flush   Filed Vitals:   07/22/15 0300 07/22/15 0400 07/22/15 0500 07/22/15 0600  BP: 109/63 92/62 94/59  93/65  Pulse: 100 103 106 108  Temp: 98 F (36.7 C)     TempSrc: Oral     Resp: 19 22 22  0  Height:      Weight:      SpO2: 93% 99% 98% 100%    Intake/Output Summary (Last 24 hours) at 07/22/15 0727 Last data filed at 07/22/15 0600  Gross per 24 hour  Intake 533.38 ml  Output   1350 ml  Net -816.62 ml    LABS: Basic Metabolic Panel:  Recent Labs  40/98/1106/22/17 1438 07/22/15 0400  NA 136 136  K 3.9 3.7  CL 101 103  CO2 26 26  GLUCOSE 258* 308*  BUN 39* 37*  CREATININE 1.86* 1.84*  CALCIUM 8.9 8.5*   Liver  Function Tests:  Recent Labs  07/20/15 1146 07/21/15 1438  AST 24 21  ALT 48 41  ALKPHOS 125 126  BILITOT 1.3* 1.2  PROT 6.2* 6.4*  ALBUMIN 2.7* 2.9*   No results for input(s): LIPASE, AMYLASE in the last 72 hours. CBC:  Recent Labs  07/21/15 1438 07/22/15 0400  WBC 3.6* 4.6  NEUTROABS 1.8  --   HGB 10.7* 9.7*  HCT 34.9* 31.4*  MCV 95.6 95.2  PLT 133* 128*   Cardiac Enzymes: No results for input(s): CKTOTAL, CKMB, CKMBINDEX, TROPONINI in the last 72 hours. BNP: Invalid input(s): POCBNP D-Dimer: No results for input(s): DDIMER in the last 72 hours. Hemoglobin A1C: No results for input(s): HGBA1C in the last 72 hours. Fasting Lipid Panel: No results for input(s): CHOL, HDL, LDLCALC, TRIG, CHOLHDL, LDLDIRECT in the last 72 hours. Thyroid Function Tests:  Recent Labs  07/21/15 1438  TSH 1.223   Anemia Panel: No results for input(s): VITAMINB12, FOLATE, FERRITIN, TIBC, IRON, RETICCTPCT in the last 72 hours.  RADIOLOGY: No results found.  PHYSICAL EXAM General: NAD Neck: JVP 16 cm, no thyromegaly or thyroid nodule.  Lungs: Clear to auscultation bilaterally with normal respiratory effort. CV: Lateral PMI.  Heart mildly  tachy, regular S1/S2, no S3/S4, no murmur.  2+ edema to knees.   Abdomen: Soft, nontender, no hepatosplenomegaly, no distention.  Neurologic: Alert and oriented x 3.  Psych: Normal affect. Extremities: No clubbing or cyanosis.   TELEMETRY: Reviewed telemetry pt in sinus tachy 100s  ASSESSMENT AND PLAN: 75 yo with ischemic CMP and CAD s/p CABG admitted 6/22 after RHC with significant volume overload.  1. Chronic systolic CHF: Ischemic cardiomyopathy. [facility] with EF < 25%, down from 40% in 7/16. On exam today, she is markedly volume overloaded with NYHA class IIIb-IV symptoms. Medical titration limited by low BP and renal dysfunction. She had RHC 6/22 => Cardiac output was relatively preserved but she was significantly volume  overloaded.  Milrinone was started given renal dysfunction and need for considerable diuresis.  Today, co-ox 61% with CVP about 16.  - Continue diuresis => increase Lasix gtt to 15 mg/hr and will give dose of metolazone 2.5 mg po today.  - Continue milrinone while aggressively diuresing.  Digoxin added.  - Continue Bidil 1/2 tablet tid.  - Place Ted hose.  - No beta blocker for now, has not tolerated spironolactone in the past.   - Will repeat echo in our system.  - ECG with LBBB, QRS 158 msec. Will need to consider CRT-D when more stable.  2. CAD: s/p CABG. No chest pain since prior to [facility]. Given fall in EF, concerned for worsened CAD. Ideally, she would have coronary angiography. Creatinine 1.8 currently.  If creatinine remains stable/improves, could likely do coronary angiography at some point in the future. Continue ASA 81 and atorvastatin.  3. CKD: Stage III.BMET stable today.   Marca AnconaDalton [place] Pelto 07/22/2015 7:34 AM

## 2015-07-23 LAB — GLUCOSE, CAPILLARY
GLUCOSE-CAPILLARY: 149 mg/dL — AB (ref 65–99)
GLUCOSE-CAPILLARY: 199 mg/dL — AB (ref 65–99)
Glucose-Capillary: 256 mg/dL — ABNORMAL HIGH (ref 65–99)
Glucose-Capillary: 300 mg/dL — ABNORMAL HIGH (ref 65–99)

## 2015-07-23 LAB — CARBOXYHEMOGLOBIN
CARBOXYHEMOGLOBIN: 2 % — AB (ref 0.5–1.5)
METHEMOGLOBIN: 0.8 % (ref 0.0–1.5)
O2 SAT: 63.3 %
Total hemoglobin: 10.1 g/dL — ABNORMAL LOW (ref 12.0–16.0)

## 2015-07-23 LAB — CBC
HCT: 32.2 % — ABNORMAL LOW (ref 36.0–46.0)
Hemoglobin: 10.1 g/dL — ABNORMAL LOW (ref 12.0–15.0)
MCH: 29.6 pg (ref 26.0–34.0)
MCHC: 31.4 g/dL (ref 30.0–36.0)
MCV: 94.4 fL (ref 78.0–100.0)
PLATELETS: 130 10*3/uL — AB (ref 150–400)
RBC: 3.41 MIL/uL — AB (ref 3.87–5.11)
RDW: 14.9 % (ref 11.5–15.5)
WBC: 4.7 10*3/uL (ref 4.0–10.5)

## 2015-07-23 LAB — BASIC METABOLIC PANEL
Anion gap: 8 (ref 5–15)
BUN: 33 mg/dL — AB (ref 6–20)
CALCIUM: 8.9 mg/dL (ref 8.9–10.3)
CO2: 27 mmol/L (ref 22–32)
CREATININE: 1.82 mg/dL — AB (ref 0.44–1.00)
Chloride: 100 mmol/L — ABNORMAL LOW (ref 101–111)
GFR calc non Af Amer: 27 mL/min — ABNORMAL LOW (ref >60)
GFR, EST AFRICAN AMERICAN: 31 mL/min — AB (ref >60)
Glucose, Bld: 152 mg/dL — ABNORMAL HIGH (ref 65–99)
Potassium: 3.9 mmol/L (ref 3.5–5.1)
SODIUM: 135 mmol/L (ref 135–145)

## 2015-07-23 MED ORDER — METOLAZONE 2.5 MG PO TABS
2.5000 mg | ORAL_TABLET | Freq: Two times a day (BID) | ORAL | Status: AC
  Administered 2015-07-23 (×2): 2.5 mg via ORAL
  Filled 2015-07-23 (×2): qty 1

## 2015-07-23 MED ORDER — SPIRONOLACTONE 25 MG PO TABS
12.5000 mg | ORAL_TABLET | Freq: Every day | ORAL | Status: DC
  Administered 2015-07-23 – 2015-08-03 (×12): 12.5 mg via ORAL
  Filled 2015-07-23 (×12): qty 1

## 2015-07-23 MED ORDER — INSULIN ASPART PROT & ASPART (70-30 MIX) 100 UNIT/ML ~~LOC~~ SUSP
25.0000 [IU] | Freq: Two times a day (BID) | SUBCUTANEOUS | Status: DC
  Administered 2015-07-23 – 2015-08-03 (×22): 25 [IU] via SUBCUTANEOUS

## 2015-07-23 MED ORDER — POTASSIUM CHLORIDE CRYS ER 20 MEQ PO TBCR: 40.0000 meq | EXTENDED_RELEASE_TABLET | Freq: Once | ORAL | Status: AC

## 2015-07-23 NOTE — Progress Notes (Signed)
Patient ID: [name] [name], female   DOB: 12/22/1940, 75 y.o.   MRN: [id number]   SUBJECTIVE: Some diuresis yesterday, weight down 3 lbs.  Feeling better but still very dyspneic with walking.  Co-ox 63%, CVP 16-17 when checked by me today.   Echo: EF 25-30% with multiple wall motion abnormalities, moderately dilated RV with moderately decreased RV systolic function, moderate MR, PASP 54 mmHg.   RHC Procedural Findings (07/21/15): Hemodynamics (mmHg) RA mean 17 RV 51/21 PA 57/32, mean 45 PCWP mean 27  Oxygen saturations: PA 62% AO 100%  Cardiac Output (Fick) 4.5  Cardiac Index (Fick) 2.3 PVR 4 WU      Scheduled Meds: . aspirin EC  81 mg Oral Daily  . atorvastatin  40 mg Oral q1800  . digoxin  0.125 mg Oral Daily  . heparin  5,000 Units Subcutaneous Q8H  . insulin aspart  0-15 Units Subcutaneous TID WC  . insulin aspart  0-5 Units Subcutaneous QHS  . insulin aspart protamine- aspart  25 Units Subcutaneous BID WC  . isosorbide-hydrALAZINE  0.5 tablet Oral TID  . metolazone  2.5 mg Oral BID  . potassium chloride  40 mEq Oral BID  . potassium chloride  40 mEq Oral Once  . sodium chloride flush  10-40 mL Intracatheter Q12H  . sodium chloride flush  3 mL Intravenous Q12H  . spironolactone  12.5 mg Oral Daily   Continuous Infusions: . furosemide (LASIX) infusion 15 mg/hr (07/23/15 0133)  . milrinone 0.25 mcg/kg/min (07/23/15 0000)   PRN Meds:.sodium chloride, acetaminophen, ondansetron (ZOFRAN) IV, sodium chloride flush, sodium chloride flush   Filed Vitals:   07/23/15 0338 07/23/15 0400 07/23/15 0617 07/23/15 0802  BP: 102/65   94/57  Pulse: 98 97 107 105  Temp: 98.6 F (37 C)   98.1 F (36.7 C)  TempSrc: Oral   Oral  Resp: 21 21 26 21   Height:      Weight:   217 lb 9.5 oz (98.7 kg)   SpO2: 98% 100% 98% 98%    Intake/Output Summary (Last 24 hours) at 07/23/15 0831 Last data filed at 07/23/15 0600  Gross per 24 hour  Intake  464.2 ml  Output   2000 ml  Net  -1535.8 ml    LABS: Basic Metabolic Panel:  Recent Labs  82/95/6206/23/17 0400 07/23/15 0502  NA 136 135  K 3.7 3.9  CL 103 100*  CO2 26 27  GLUCOSE 308* 152*  BUN 37* 33*  CREATININE 1.84* 1.82*  CALCIUM 8.5* 8.9   Liver Function Tests:  Recent Labs  07/20/15 1146 07/21/15 1438  AST 24 21  ALT 48 41  ALKPHOS 125 126  BILITOT 1.3* 1.2  PROT 6.2* 6.4*  ALBUMIN 2.7* 2.9*   No results for input(s): LIPASE, AMYLASE in the last 72 hours. CBC:  Recent Labs  07/21/15 1438 07/22/15 0400 07/23/15 0502  WBC 3.6* 4.6 4.7  NEUTROABS 1.8  --   --   HGB 10.7* 9.7* 10.1*  HCT 34.9* 31.4* 32.2*  MCV 95.6 95.2 94.4  PLT 133* 128* 130*   Cardiac Enzymes: No results for input(s): CKTOTAL, CKMB, CKMBINDEX, TROPONINI in the last 72 hours. BNP: Invalid input(s): POCBNP D-Dimer: No results for input(s): DDIMER in the last 72 hours. Hemoglobin A1C: No results for input(s): HGBA1C in the last 72 hours. Fasting Lipid Panel: No results for input(s): CHOL, HDL, LDLCALC, TRIG, CHOLHDL, LDLDIRECT in the last 72 hours. Thyroid Function Tests:  Recent Labs  07/21/15 1438  TSH 1.223   Anemia Panel: No results for input(s): VITAMINB12, FOLATE, FERRITIN, TIBC, IRON, RETICCTPCT in the last 72 hours.  RADIOLOGY: Dg Chest Port 1 View  07/22/2015  CLINICAL DATA:  Acute on chronic CHF, mitral and aortic insufficiency, coronary artery disease, former smoker, obesity. EXAM: PORTABLE CHEST 1 VIEW COMPARISON:  Chest x-ray of December 04, 2010 FINDINGS: The lungs are adequately inflated. There is no focal infiltrate and no pleural effusion. The cardiac silhouette is enlarged. The pulmonary vascularity is engorged with mild cephalization observed. There are post CABG changes. There is calcification in the aortic arch. The PICC line tip projects over the midportion of the SVC. The observed bony thorax exhibits no acute abnormality. IMPRESSION: 1. CHF with mild pulmonary vascular congestion. There is no  pneumonia. 2. Aortic atherosclerosis. Electronically Signed   By: David  SwazilandJordan M.D.   On: 07/22/2015 07:33    PHYSICAL EXAM General: NAD Neck: JVP 16 cm, no thyromegaly or thyroid nodule.  Lungs: Clear to auscultation bilaterally with normal respiratory effort. CV: Lateral PMI.  Heart mildly tachy, regular S1/S2, no S3/S4, no murmur.  2+ edema to knees.   Abdomen: Soft, nontender, no hepatosplenomegaly, no distention.  Neurologic: Alert and oriented x 3.  Psych: Normal affect. Extremities: No clubbing or cyanosis.   TELEMETRY: Reviewed telemetry pt in sinus tachy 100s  ASSESSMENT AND PLAN: 75 yo with ischemic CMP and CAD s/p CABG admitted 6/22 after RHC with significant volume overload.  1. Chronic systolic CHF: Ischemic cardiomyopathy. Echo with EF 25-30% and moderately decreased RV systolic function this admission. Medicine titration limited by low BP and renal dysfunction. She had RHC 6/22 => Cardiac output was relatively preserved but she was significantly volume overloaded.  Milrinone was started given renal dysfunction and need for considerable diuresis.  Today, co-ox 63% with CVP about 16-17.  Some diuresis yesterday with weight down 3 lbs but still volume overloaded.   - Continue diuresis => Lasix gtt to 15 mg/hr and metolazone 2.5 mg po bid today.  - Continue milrinone while aggressively diuresing.  Digoxin added.  - Continue Bidil 1/2 tablet tid.  - Place Ted hose.  - No beta blocker for now.  - Will add low dose spironolactone.    - ECG with LBBB, QRS 158 msec. Will need to consider CRT-D when more stable.  2. CAD: s/p CABG. No chest pain since [facility]. Given fall in EF, concerned for worsened CAD. Ideally, she would have coronary angiography. Creatinine 1.8 currently.  If creatinine remains stable/improves, could likely do coronary angiography at some point in the future. Continue ASA 81 and atorvastatin.  3. CKD: Stage III.BMET stable  today.  4. Mobilize.   Marca AnconaDalton McLean 07/23/2015 8:31 AM

## 2015-07-23 NOTE — Progress Notes (Signed)
Pharmacist Heart Failure Core Measure Documentation  Assessment: [name] ColderDorothy C Blayney has an EF documented as 25-30% on 6-23 by ECHO.  Rationale: Heart failure patients with left ventricular systolic dysfunction (LVSD) and an EF < 40% should be prescribed an angiotensin converting enzyme inhibitor (ACEI) or angiotensin receptor blocker (ARB) at discharge unless a contraindication is documented in the medical record.  This patient is not currently on an ACEI or ARB for HF.  This note is being placed in the record in order to provide documentation that a contraindication to the use of these agents is present for this encounter.  ACE Inhibitor or Angiotensin Receptor Blocker is contraindicated (specify all that apply)  []   ACEI allergy AND ARB allergy []   Angioedema []   Moderate or severe aortic stenosis []   Hyperkalemia [x]   Hypotension []   Renal artery stenosis []   Worsening renal function, preexisting renal disease or dysfunction - Creat. 1.82   Chinita GreenlandJohnston, Corry Ihnen Faye 07/23/2015 2:33 PM

## 2015-07-24 ENCOUNTER — Inpatient Hospital Stay (HOSPITAL_COMMUNITY): Payer: Medicare Other

## 2015-07-24 LAB — URINE MICROSCOPIC-ADD ON: RBC / HPF: NONE SEEN RBC/hpf (ref 0–5)

## 2015-07-24 LAB — URINALYSIS, ROUTINE W REFLEX MICROSCOPIC
BILIRUBIN URINE: NEGATIVE
Glucose, UA: NEGATIVE mg/dL
Hgb urine dipstick: NEGATIVE
Ketones, ur: NEGATIVE mg/dL
NITRITE: NEGATIVE
Protein, ur: NEGATIVE mg/dL
SPECIFIC GRAVITY, URINE: 1.01 (ref 1.005–1.030)
pH: 6.5 (ref 5.0–8.0)

## 2015-07-24 LAB — CARBOXYHEMOGLOBIN
CARBOXYHEMOGLOBIN: 2.1 % — AB (ref 0.5–1.5)
Methemoglobin: 0.8 % (ref 0.0–1.5)
O2 SAT: 69.8 %
TOTAL HEMOGLOBIN: 9.6 g/dL — AB (ref 12.0–16.0)

## 2015-07-24 LAB — CBC
HCT: 30 % — ABNORMAL LOW (ref 36.0–46.0)
HEMOGLOBIN: 9.3 g/dL — AB (ref 12.0–15.0)
MCH: 29.3 pg (ref 26.0–34.0)
MCHC: 31 g/dL (ref 30.0–36.0)
MCV: 94.6 fL (ref 78.0–100.0)
Platelets: 146 10*3/uL — ABNORMAL LOW (ref 150–400)
RBC: 3.17 MIL/uL — ABNORMAL LOW (ref 3.87–5.11)
RDW: 14.9 % (ref 11.5–15.5)
WBC: 4.8 10*3/uL (ref 4.0–10.5)

## 2015-07-24 LAB — BASIC METABOLIC PANEL
ANION GAP: 8 (ref 5–15)
BUN: 33 mg/dL — ABNORMAL HIGH (ref 6–20)
CALCIUM: 8.9 mg/dL (ref 8.9–10.3)
CO2: 29 mmol/L (ref 22–32)
Chloride: 97 mmol/L — ABNORMAL LOW (ref 101–111)
Creatinine, Ser: 2.1 mg/dL — ABNORMAL HIGH (ref 0.44–1.00)
GFR calc Af Amer: 26 mL/min — ABNORMAL LOW (ref >60)
GFR, EST NON AFRICAN AMERICAN: 22 mL/min — AB (ref >60)
GLUCOSE: 188 mg/dL — AB (ref 65–99)
Potassium: 4.3 mmol/L (ref 3.5–5.1)
SODIUM: 134 mmol/L — AB (ref 135–145)

## 2015-07-24 LAB — GLUCOSE, CAPILLARY
GLUCOSE-CAPILLARY: 254 mg/dL — AB (ref 65–99)
GLUCOSE-CAPILLARY: 235 mg/dL — AB (ref 65–99)
Glucose-Capillary: 170 mg/dL — ABNORMAL HIGH (ref 65–99)
Glucose-Capillary: 268 mg/dL — ABNORMAL HIGH (ref 65–99)

## 2015-07-24 MED ORDER — FUROSEMIDE 10 MG/ML IJ SOLN
160.0000 mg | Freq: Three times a day (TID) | INTRAVENOUS | Status: DC
  Administered 2015-07-24 – 2015-07-27 (×11): 160 mg via INTRAVENOUS
  Filled 2015-07-24 (×16): qty 16

## 2015-07-24 NOTE — Progress Notes (Signed)
Patient ID: [name] [name], female   DOB: 03/09/1940, 75 y.o.   MRN: [id number]   SUBJECTIVE:  Lasix gtt increased to 15/hr yesterday. Metolazone increased to 2.5 bid and spiro 12.5 added. Remains on milrinone. Diuresis still sluggish. Weight unchanged. Creatinine climbing back 1.8 -> 2.1. Co-ox 70%. CVP still 16  Echo: EF 25-30% with multiple wall motion abnormalities, moderately dilated RV with moderately decreased RV systolic function, moderate MR, PASP 54 mmHg.   RHC Procedural Findings (07/21/15): Hemodynamics (mmHg) RA mean 17 RV 51/21 PA 57/32, mean 45 PCWP mean 27  Oxygen saturations: PA 62% AO 100%  Cardiac Output (Fick) 4.5  Cardiac Index (Fick) 2.3 PVR 4 WU      Scheduled Meds: . aspirin EC  81 mg Oral Daily  . atorvastatin  40 mg Oral q1800  . digoxin  0.125 mg Oral Daily  . heparin  5,000 Units Subcutaneous Q8H  . insulin aspart  0-15 Units Subcutaneous TID WC  . insulin aspart  0-5 Units Subcutaneous QHS  . insulin aspart protamine- aspart  25 Units Subcutaneous BID WC  . isosorbide-hydrALAZINE  0.5 tablet Oral TID  . potassium chloride  40 mEq Oral BID  . sodium chloride flush  10-40 mL Intracatheter Q12H  . sodium chloride flush  3 mL Intravenous Q12H  . spironolactone  12.5 mg Oral Daily   Continuous Infusions: . furosemide (LASIX) infusion 15 mg/hr (07/24/15 1208)  . milrinone 0.25 mcg/kg/min (07/24/15 1211)   PRN Meds:.sodium chloride, acetaminophen, ondansetron (ZOFRAN) IV, sodium chloride flush, sodium chloride flush   Filed Vitals:   07/24/15 0216 07/24/15 0315 07/24/15 0358 07/24/15 0743  BP:   96/53 95/54  Pulse:    94  Temp:  98.6 F (37 C)  98.7 F (37.1 C)  TempSrc:  Oral  Oral  Resp: 24 20  21   Height:      Weight: 98.567 kg (217 lb 4.8 oz)     SpO2:  96%  96%    Intake/Output Summary (Last 24 hours) at 07/24/15 1212 Last data filed at 07/24/15 1200  Gross per 24 hour  Intake 1662.8 ml  Output   1650 ml  Net   12.8 ml     LABS: Basic Metabolic Panel:  Recent Labs  40/98/1106/24/17 0502 07/24/15 0400  NA 135 134*  K 3.9 4.3  CL 100* 97*  CO2 27 29  GLUCOSE 152* 188*  BUN 33* 33*  CREATININE 1.82* 2.10*  CALCIUM 8.9 8.9   Liver Function Tests:  Recent Labs  07/21/15 1438  AST 21  ALT 41  ALKPHOS 126  BILITOT 1.2  PROT 6.4*  ALBUMIN 2.9*   No results for input(s): LIPASE, AMYLASE in the last 72 hours. CBC:  Recent Labs  07/21/15 1438  07/23/15 0502 07/24/15 0400  WBC 3.6*  < > 4.7 4.8  NEUTROABS 1.8  --   --   --   HGB 10.7*  < > 10.1* 9.3*  HCT 34.9*  < > 32.2* 30.0*  MCV 95.6  < > 94.4 94.6  PLT 133*  < > 130* 146*  < > = values in this interval not displayed. Cardiac Enzymes: No results for input(s): CKTOTAL, CKMB, CKMBINDEX, TROPONINI in the last 72 hours. BNP: Invalid input(s): POCBNP D-Dimer: No results for input(s): DDIMER in the last 72 hours. Hemoglobin A1C: No results for input(s): HGBA1C in the last 72 hours. Fasting Lipid Panel: No results for input(s): CHOL, HDL, LDLCALC, TRIG, CHOLHDL, LDLDIRECT in the last 72  hours. Thyroid Function Tests:  Recent Labs  07/21/15 1438  TSH 1.223   Anemia Panel: No results for input(s): VITAMINB12, FOLATE, FERRITIN, TIBC, IRON, RETICCTPCT in the last 72 hours.  RADIOLOGY: Dg Chest Port 1 View  07/22/2015  CLINICAL DATA:  Acute on chronic CHF, mitral and aortic insufficiency, coronary artery disease, former smoker, obesity. EXAM: PORTABLE CHEST 1 VIEW COMPARISON:  Chest x-ray of December 04, 2010 FINDINGS: The lungs are adequately inflated. There is no focal infiltrate and no pleural effusion. The cardiac silhouette is enlarged. The pulmonary vascularity is engorged with mild cephalization observed. There are post CABG changes. There is calcification in the aortic arch. The PICC line tip projects over the midportion of the SVC. The observed bony thorax exhibits no acute abnormality. IMPRESSION: 1. CHF with mild pulmonary vascular  congestion. There is no pneumonia. 2. Aortic atherosclerosis. Electronically Signed   By: David  SwazilandJordan M.D.   On: 07/22/2015 07:33    PHYSICAL EXAM General: NAD. Sitting in chair eating lunch Neck: JVP 16 cm, no thyromegaly or thyroid nodule.  Lungs: Clear to auscultation bilaterally with normal respiratory effort. CV: Lateral PMI.  Heart mildly tachy, regular S1/S2, no S3/S4, no murmur.  2+ edema to knees.   Abdomen: Soft, nontender, no hepatosplenomegaly, no distention.  Neurologic: Alert and oriented x 3.  Psych: Normal affect. Extremities: No clubbing or cyanosis.   TELEMETRY: Reviewed telemetry pt in sinus tachy 100s  ASSESSMENT AND PLAN: 75 yo with ischemic CMP and CAD s/p CABG admitted 6/22 after RHC with significant volume overload.  1. Acute on chronic systolic CHF: Ischemic cardiomyopathy. Echo with EF 25-30% and moderately decreased RV systolic function this admission. Medicine titration limited by low BP and renal dysfunction. She had RHC 6/22 => Cardiac output was relatively preserved but she was significantly volume overloaded.  Milrinone was started given renal dysfunction and need for considerable diuresis.  Today, co-ox 70% with CVP still about 15-16.  Minimal diuresis despite lasix gtt at 15. - Still volume overloaded. Sluggish response to lasix and milrinone despite adequate co-ox. - Suspect CKD playing a major role. Check renal u/s and UA.  - Switch to lasix 160 IV tid  - Continue milrinone while aggressively diuresing.   - Continue Bidil 1/2 tablet tid and spiro 12.5. Be careful with digoxin given CKD. Check level soon. May need to hold Bidil to let BP rise for renal perfusion. - Continue Ted hose.  - No beta blocker or ACE/ARB for now.  - ECG with LBBB, QRS 158 msec. Will need to consider CRT-D when more stable.  2. CAD: s/p CABG. [facility]. Given fall in EF, concerned for worsened CAD. Ideally, she would have  coronary angiography. If creatinine remains stable/improves, could likely do coronary angiography at some point in the future. Continue ASA 81 and atorvastatin.  3. CKD: Stage IV.Creatinine up today but still within baseline range 1.8-2.1 - Suspect CKD is major factor in limited diuresis. Check renal u/s and UA 4. Mobilize.   Arvilla MeresBensimhon, Daniel MD 07/24/2015 12:12 PM

## 2015-07-25 DIAGNOSIS — N184 Chronic kidney disease, stage 4 (severe): Secondary | ICD-10-CM

## 2015-07-25 LAB — GLUCOSE, CAPILLARY
Glucose-Capillary: 83 mg/dL (ref 65–99)
Glucose-Capillary: 195 mg/dL — ABNORMAL HIGH (ref 65–99)
Glucose-Capillary: 206 mg/dL — ABNORMAL HIGH (ref 65–99)
Glucose-Capillary: 146 mg/dL — ABNORMAL HIGH (ref 65–99)

## 2015-07-25 LAB — BASIC METABOLIC PANEL
ANION GAP: 7 (ref 5–15)
BUN: 31 mg/dL — ABNORMAL HIGH (ref 6–20)
CO2: 30 mmol/L (ref 22–32)
Calcium: 9.1 mg/dL (ref 8.9–10.3)
Chloride: 100 mmol/L — ABNORMAL LOW (ref 101–111)
Creatinine, Ser: 1.86 mg/dL — ABNORMAL HIGH (ref 0.44–1.00)
GFR calc Af Amer: 30 mL/min — ABNORMAL LOW (ref >60)
GFR, EST NON AFRICAN AMERICAN: 26 mL/min — AB (ref >60)
GLUCOSE: 78 mg/dL (ref 65–99)
POTASSIUM: 4.1 mmol/L (ref 3.5–5.1)
SODIUM: 137 mmol/L (ref 135–145)

## 2015-07-25 LAB — CARBOXYHEMOGLOBIN
CARBOXYHEMOGLOBIN: 1.7 % — AB (ref 0.5–1.5)
METHEMOGLOBIN: 1.2 % (ref 0.0–1.5)
O2 SAT: 68.6 %
TOTAL HEMOGLOBIN: 9.8 g/dL — AB (ref 12.0–16.0)

## 2015-07-25 MED ORDER — METOLAZONE 2.5 MG PO TABS
2.5000 mg | ORAL_TABLET | Freq: Once | ORAL | Status: AC
  Administered 2015-07-25: 2.5 mg via ORAL
  Filled 2015-07-25: qty 1

## 2015-07-25 NOTE — Progress Notes (Signed)
Patient ID: [name] [name], female   DOB: 02/28/1940, 75 y.o.   MRN: [id number]   SUBJECTIVE:  Diuresis better yesterday on high dose Lasix, weight down 4 lbs.  Creatinine down to 1.8.  CVP 13-14 today.   Echo: EF 25-30% with multiple wall motion abnormalities, moderately dilated RV with moderately decreased RV systolic function, moderate MR, PASP 54 mmHg.   RHC Procedural Findings (07/21/15): Hemodynamics (mmHg) RA mean 17 RV 51/21 PA 57/32, mean 45 PCWP mean 27  Oxygen saturations: PA 62% AO 100%  Cardiac Output (Fick) 4.5  Cardiac Index (Fick) 2.3 PVR 4 WU      Scheduled Meds: . aspirin EC  81 mg Oral Daily  . atorvastatin  40 mg Oral q1800  . digoxin  0.125 mg Oral Daily  . furosemide  160 mg Intravenous TID AC  . heparin  5,000 Units Subcutaneous Q8H  . insulin aspart  0-15 Units Subcutaneous TID WC  . insulin aspart  0-5 Units Subcutaneous QHS  . insulin aspart protamine- aspart  25 Units Subcutaneous BID WC  . isosorbide-hydrALAZINE  0.5 tablet Oral TID  . metolazone  2.5 mg Oral Once  . potassium chloride  40 mEq Oral BID  . sodium chloride flush  10-40 mL Intracatheter Q12H  . sodium chloride flush  3 mL Intravenous Q12H  . spironolactone  12.5 mg Oral Daily   Continuous Infusions: . milrinone 0.25 mcg/kg/min (07/24/15 2142)   PRN Meds:.sodium chloride, acetaminophen, ondansetron (ZOFRAN) IV, sodium chloride flush, sodium chloride flush   Filed Vitals:   07/24/15 2010 07/24/15 2308 07/24/15 2320 07/25/15 0335  BP:  79/47 97/61 98/58   Pulse:  91    Temp: 98.2 F (36.8 C) 98.3 F (36.8 C)  98.1 F (36.7 C)  TempSrc: Oral Oral  Oral  Resp: 21 14 20 19   Height:      Weight:    213 lb 9.6 oz (96.888 kg)  SpO2: 98% 97%  98%    Intake/Output Summary (Last 24 hours) at 07/25/15 0759 Last data filed at 07/25/15 0600  Gross per 24 hour  Intake 1458.8 ml  Output   3175 ml  Net -1716.2 ml    LABS: Basic Metabolic Panel:  Recent Labs   07/24/15 0400 07/25/15 0343  NA 134* 137  K 4.3 4.1  CL 97* 100*  CO2 29 30  GLUCOSE 188* 78  BUN 33* 31*  CREATININE 2.10* 1.86*  CALCIUM 8.9 9.1   Liver Function Tests: No results for input(s): AST, ALT, ALKPHOS, BILITOT, PROT, ALBUMIN in the last 72 hours. No results for input(s): LIPASE, AMYLASE in the last 72 hours. CBC:  Recent Labs  07/23/15 0502 07/24/15 0400  WBC 4.7 4.8  HGB 10.1* 9.3*  HCT 32.2* 30.0*  MCV 94.4 94.6  PLT 130* 146*   Cardiac Enzymes: No results for input(s): CKTOTAL, CKMB, CKMBINDEX, TROPONINI in the last 72 hours. BNP: Invalid input(s): POCBNP D-Dimer: No results for input(s): DDIMER in the last 72 hours. Hemoglobin A1C: No results for input(s): HGBA1C in the last 72 hours. Fasting Lipid Panel: No results for input(s): CHOL, HDL, LDLCALC, TRIG, CHOLHDL, LDLDIRECT in the last 72 hours. Thyroid Function Tests: No results for input(s): TSH, T4TOTAL, T3FREE, THYROIDAB in the last 72 hours.  Invalid input(s): FREET3 Anemia Panel: No results for input(s): VITAMINB12, FOLATE, FERRITIN, TIBC, IRON, RETICCTPCT in the last 72 hours.  RADIOLOGY: [place] Renal  07/24/2015  CLINICAL DATA:  Acute renal failure. EXAM: RENAL / URINARY TRACT ULTRASOUND COMPLETE  COMPARISON:  None. FINDINGS: Right Kidney: Length: 10.2 cm. There is a 3.2 x 3.2 x 3 cm mass in the mid right kidney. This does is not demonstrate the appearance of a simple cyst and may contain solid components. No hydronephrosis. Cortical thinning is identified. Left Kidney: Length: 10.1 cm.  Cortical thinning with no hydronephrosis. Bladder: Appears normal for degree of bladder distention. IMPRESSION: 1. There is a mass in the right kidney which does not have the appearance of a simple cyst and may contain solid components. This raises the possibility of a renal neoplasm. This should be further assessed with a contrast-enhanced CT scan or MRI. MRI would be the most sensitive and specific exam.  Electronically Signed   By: Gerome Sam III M.D   On: 07/24/2015 19:09   Dg Chest Port 1 View  07/22/2015  CLINICAL DATA:  Acute on chronic CHF, mitral and aortic insufficiency, coronary artery disease, former smoker, obesity. EXAM: PORTABLE CHEST 1 VIEW COMPARISON:  Chest x-ray of December 04, 2010 FINDINGS: The lungs are adequately inflated. There is no focal infiltrate and no pleural effusion. The cardiac silhouette is enlarged. The pulmonary vascularity is engorged with mild cephalization observed. There are post CABG changes. There is calcification in the aortic arch. The PICC line tip projects over the midportion of the SVC. The observed bony thorax exhibits no acute abnormality. IMPRESSION: 1. CHF with mild pulmonary vascular congestion. There is no pneumonia. 2. Aortic atherosclerosis. Electronically Signed   By: David  [place]and M.D.   On: 07/22/2015 07:33    PHYSICAL EXAM General: NAD. Sitting in chair eating lunch Neck: JVP 14 cm, no thyromegaly or thyroid nodule.  Lungs: Clear to auscultation bilaterally with normal respiratory effort. CV: Lateral PMI.  Heart mildly tachy, regular S1/S2, no S3/S4, no murmur.  2+ edema to knees.   Abdomen: Soft, nontender, no hepatosplenomegaly, no distention.  Neurologic: Alert and oriented x 3.  Psych: Normal affect. Extremities: No clubbing or cyanosis.   TELEMETRY: Reviewed telemetry NSR  ASSESSMENT AND PLAN: 75 yo with ischemic CMP and CAD s/p CABG admitted 6/22 after RHC with significant volume overload.  1. Acute on chronic systolic CHF: Ischemic cardiomyopathy. Echo with EF 25-30% and moderately decreased RV systolic function this admission. Medicine titration limited by low BP and renal dysfunction. She had RHC 6/22 => Cardiac output was relatively preserved but she was significantly volume overloaded.  Milrinone was started given renal dysfunction and need for considerable diuresis.  Today, co-ox 69% with CVP still about 13-14.  Better  diuresis with Lasix 160 mg IV tid. - Still volume overloaded. Creatinine better.  Will give 1 dose metolazone 2.5 and continue current Lasix.  - Continue milrinone while aggressively diuresing.   - Continue Bidil 1/2 tablet tid and spiro 12.5. SBP in 90s, if drops any lower will need to hold Bidil.  - Continue digoxin 0.125, check level in am.  - Continue Ted hose.  - No beta blocker or ACE/ARB for now.  - ECG with LBBB, QRS 158 msec. Will need to consider CRT-D when more stable.  2. CAD: s/p CABG. No chest pain since prior to admission at [facility] in 5/17. Given fall in EF, concerned for worsened CAD. Ideally, she would have coronary angiography. If creatinine remains stable/improves, could likely do coronary angiography at some point in the future. Continue ASA 81 and atorvastatin.  3. CKD: Stage IV.Creatinine back down to 1.8 today.  Follow carefully with diuresis.  Suspect CKD is major factor in  limited diuresis.  4. Renal mass: Mass in right kidney, ?renal neoplasm.  Suggest further evaluation with CTA or MRI.  Creatinine up, suspect MRI is best if we can avoid contrast.   5. Mobilize. Still waiting for PT.   Marca Anconaalton Rina Adney MD 07/25/2015 7:59 AM

## 2015-07-25 NOTE — Care Management Important Message (Signed)
Important Message  Patient Details  Name: [name] [name] MRN: [id number] Date of Birth: 01/16/1941   Medicare Important Message Given:  Yes    Bernadette HoitShoffner, [name] [name] 07/25/2015, 8:44 AM

## 2015-07-25 NOTE — Progress Notes (Signed)
CARDIAC REHAB PHASE I   PRE:  Rate/Rhythm: 97 SR    BP: sitting 98/58    SaO2: 97 RA  MODE:  Ambulation: 160 ft   POST:  Rate/Rhythm: 113 ST    BP: sitting 98/57     SaO2: 98 RA  Pt to BSC then ambulated in hall with RW. Steady walking. DOE with distance, rest x2. Also c/o her abdomen feeling "tired", like "your legs get tired". Return to chair. Increased distance but pt tired after walk. Will f/u. Denied CP (like she had 5/17).  [id number]  [name] MassonRandi Kristan Candia [name] CES, ACSM 07/25/2015 2:16 PM

## 2015-07-25 NOTE — Evaluation (Signed)
Physical Therapy Evaluation Patient Details Name: [name] [name] MRN: [id number] DOB: 07/22/1940 Today's Date: 07/25/2015   History of Present Illness  Pt is a 75 y/o F who presented w/ dyspnea and significant LE edema.  Pt volume overloaded w/ acute on chronic systolic CHF.  Pt's PMH includes CABG, stage III CKD, DM II, sinus bradycardia, obesity.    Clinical Impression  Pt admitted with above diagnosis. Pt currently with functional limitations due to the deficits listed below (see PT Problem List). Ms. Lennie OdorScales presents w/ SOB after ambulating 80 ft using RW. Educated pt on pursed lip breathing technique, SpO2 remained at or above 94% while ambulating.  She is at supervision level for transfers and ambualtion and will have 24/7 assist/supervision available from family at d/c. Pt will benefit from skilled PT to increase their independence and safety with mobility to allow discharge to the venue listed below.       Follow Up Recommendations Home health PT;Supervision for mobility/OOB    Equipment Recommendations  None recommended by PT    Recommendations for Other Services OT consult     Precautions / Restrictions Precautions Precautions: Fall Precaution Comments: monitor O2 Restrictions Weight Bearing Restrictions: No      Mobility  Bed Mobility               General bed mobility comments: Pt sitting in recliner chair upon PT arrival  Transfers Overall transfer level: Needs assistance Equipment used: Rolling walker (2 wheeled) Transfers: Sit to/from Stand Sit to Stand: Supervision         General transfer comment: Supervision for safety.  Pt w/ safe technique.    Ambulation/Gait Ambulation/Gait assistance: Supervision Ambulation Distance (Feet): 80 Feet Assistive device: Rolling walker (2 wheeled) Gait Pattern/deviations: Step-through pattern;Decreased stride length;Trunk flexed   Gait velocity interpretation: Below normal speed for age/gender General Gait  Details: Trunk slightly flexed and ambulation limited to in room due to activation of fire alarm at start of session.  SpO2 remained at or above 94% while ambualting on RA.  Cues for pursed lip breathing as pt becomes SOB at end of ambulation.    Stairs            Wheelchair Mobility    Modified Rankin (Stroke Patients Only)       Balance Overall balance assessment: Needs assistance Sitting-balance support: No upper extremity supported;Feet supported Sitting balance-Leahy Scale: Good     Standing balance support: No upper extremity supported;During functional activity Standing balance-Leahy Scale: Fair Standing balance comment: Able to stand in front of chair without UE support                             Pertinent Vitals/Pain Pain Assessment: No/denies pain    Home Living Family/patient expects to be discharged to:: Private residence Living Arrangements: Spouse/significant other Available Help at Discharge: Family;Available 24 hours/day Type of Home: House Home Access: Stairs to enter Entrance Stairs-Rails: None Entrance Stairs-Number of Steps: 1 Home Layout: One level Home Equipment: Bedside commode;Cane - single point;Walker - 4 wheels;Shower seat;Hand held shower head;Wheelchair - manual      Prior Function Level of Independence: Needs assistance   Gait / Transfers Assistance Needed: Ambulates w/ cane and RW depending on which one is closer to her at the time.    ADL's / Homemaking Assistance Needed: Recently pt has needed some assist w/ washing her back and getting dressed due to SOB.  Before onset of  symtpoms pt was able to do this without assist.        Hand Dominance        Extremity/Trunk Assessment   Upper Extremity Assessment: Overall WFL for tasks assessed           Lower Extremity Assessment: Overall WFL for tasks assessed (Strength WNL; edematous Bil LEs)         Communication   Communication: No difficulties  Cognition  Arousal/Alertness: Awake/alert Behavior During Therapy: WFL for tasks assessed/performed Overall Cognitive Status: Within Functional Limits for tasks assessed                      General Comments      Exercises General Exercises - Lower Extremity Ankle Circles/Pumps: AROM;Both;15 reps;Seated Long Arc Quad: AROM;Both;10 reps;Seated Hip Flexion/Marching: Both;10 reps;Seated Other Exercises Other Exercises: Pt encouraged to ambulate again w/ nursing staff later today      Assessment/Plan    PT Assessment Patient needs continued PT services  PT Diagnosis Difficulty walking   PT Problem List Decreased activity tolerance;Decreased balance;Cardiopulmonary status limiting activity  PT Treatment Interventions DME instruction;Gait training;Stair training;Functional mobility training;Therapeutic activities;Therapeutic exercise;Balance training;Patient/family education   PT Goals (Current goals can be found in the Care Plan section) Acute Rehab PT Goals Patient Stated Goal: to feel better and go home PT Goal Formulation: With patient Time For Goal Achievement: 08/08/15 Potential to Achieve Goals: Good    Frequency Min 3X/week   Barriers to discharge        Co-evaluation               End of Session Equipment Utilized During Treatment: Gait belt Activity Tolerance: Patient tolerated treatment well;Patient limited by fatigue Patient left: in chair;with call bell/phone within reach Nurse Communication: Mobility status         Time: [id number] PT Time Calculation (min) (ACUTE ONLY): 19 min   Charges:   PT Evaluation $PT Eval Low Complexity: 1 Procedure     PT G Codes:       Encarnacion ChuAshley Abashian PT, DPT  Pager: [phone] Phone: [phone] 07/25/2015, 11:33 AM

## 2015-07-25 NOTE — Progress Notes (Signed)
Md made aware of UA lab.  Will continue to monitor Karena AddisonCoro, Adonica Fukushima T

## 2015-07-26 ENCOUNTER — Inpatient Hospital Stay (HOSPITAL_COMMUNITY): Payer: Medicare Other

## 2015-07-26 LAB — CARBOXYHEMOGLOBIN
Carboxyhemoglobin: 2 % — ABNORMAL HIGH (ref 0.5–1.5)
Methemoglobin: 0.8 % (ref 0.0–1.5)
O2 Saturation: 62 %
TOTAL HEMOGLOBIN: 9.7 g/dL — AB (ref 12.0–16.0)

## 2015-07-26 LAB — DIGOXIN LEVEL: DIGOXIN LVL: 1 ng/mL (ref 0.8–2.0)

## 2015-07-26 LAB — CBC
HCT: 30.2 % — ABNORMAL LOW (ref 36.0–46.0)
Hemoglobin: 9.4 g/dL — ABNORMAL LOW (ref 12.0–15.0)
MCH: 29.5 pg (ref 26.0–34.0)
MCHC: 31.1 g/dL (ref 30.0–36.0)
MCV: 94.7 fL (ref 78.0–100.0)
PLATELETS: 165 10*3/uL (ref 150–400)
RBC: 3.19 MIL/uL — ABNORMAL LOW (ref 3.87–5.11)
RDW: 15.2 % (ref 11.5–15.5)
WBC: 4.7 10*3/uL (ref 4.0–10.5)

## 2015-07-26 LAB — BASIC METABOLIC PANEL
Anion gap: 10 (ref 5–15)
BUN: 29 mg/dL — ABNORMAL HIGH (ref 6–20)
CALCIUM: 9.6 mg/dL (ref 8.9–10.3)
CHLORIDE: 95 mmol/L — AB (ref 101–111)
CO2: 32 mmol/L (ref 22–32)
CREATININE: 2.08 mg/dL — AB (ref 0.44–1.00)
GFR calc Af Amer: 26 mL/min — ABNORMAL LOW (ref >60)
GFR calc non Af Amer: 23 mL/min — ABNORMAL LOW (ref >60)
GLUCOSE: 132 mg/dL — AB (ref 65–99)
Potassium: 4.4 mmol/L (ref 3.5–5.1)
Sodium: 137 mmol/L (ref 135–145)

## 2015-07-26 LAB — GLUCOSE, CAPILLARY
GLUCOSE-CAPILLARY: 149 mg/dL — AB (ref 65–99)
GLUCOSE-CAPILLARY: 208 mg/dL — AB (ref 65–99)
Glucose-Capillary: 238 mg/dL — ABNORMAL HIGH (ref 65–99)
Glucose-Capillary: 145 mg/dL — ABNORMAL HIGH (ref 65–99)

## 2015-07-26 MED ORDER — DIGOXIN 125 MCG PO TABS
0.0625 mg | ORAL_TABLET | Freq: Every day | ORAL | Status: DC
  Administered 2015-07-26 – 2015-07-28 (×3): 0.0625 mg via ORAL
  Filled 2015-07-26 (×3): qty 1

## 2015-07-26 MED ORDER — METOLAZONE 2.5 MG PO TABS
2.5000 mg | ORAL_TABLET | Freq: Once | ORAL | Status: AC
  Administered 2015-07-26: 2.5 mg via ORAL
  Filled 2015-07-26: qty 1

## 2015-07-26 NOTE — Progress Notes (Signed)
CARDIAC REHAB PHASE I   PRE:  Rate/Rhythm: 96 SR c/ PVCs  BP:  Sitting: 98/54        SaO2: 97 RA  MODE:  Ambulation: 160 ft   POST:  Rate/Rhythm: 108 ST  BP:  Sitting: 98/60         SaO2: 98 RA  Pt up in recliner, awaiting MRI. Pt assisted to bathroom, then proceeded to ambulate 160 ft on RA, IV, rolling walker, assist x1, slow, steady gait, tolerated well. Pt c/o mild DOE, denies any other complaints, very brief standing rest x1. Pt to recliner after walk, feet elevated, call bell within reach. Will follow.   [id number] [name] GrapesEmily C Lorenna Lurry, RN, BSN 07/26/2015 9:00 AM

## 2015-07-26 NOTE — Progress Notes (Signed)
Patient ID: [name] [name], female   DOB: 04/18/1940, 75 y.o.   MRN: [id number]   SUBJECTIVE:  Diuresis better yesterday on high dose Lasix + metolazone, weight down 6 lbs.  Creatinine 2.08.  CVP remains 13-14 today.   Echo: EF 25-30% with multiple wall motion abnormalities, moderately dilated RV with moderately decreased RV systolic function, moderate MR, PASP 54 mmHg.   RHC Procedural Findings (07/21/15): Hemodynamics (mmHg) RA mean 17 RV 51/21 PA 57/32, mean 45 PCWP mean 27  Oxygen saturations: PA 62% AO 100%  Cardiac Output (Fick) 4.5  Cardiac Index (Fick) 2.3 PVR 4 WU      Scheduled Meds: . aspirin EC  81 mg Oral Daily  . atorvastatin  40 mg Oral q1800  . digoxin  0.0625 mg Oral Daily  . furosemide  160 mg Intravenous TID AC  . heparin  5,000 Units Subcutaneous Q8H  . insulin aspart  0-15 Units Subcutaneous TID WC  . insulin aspart  0-5 Units Subcutaneous QHS  . insulin aspart protamine- aspart  25 Units Subcutaneous BID WC  . isosorbide-hydrALAZINE  0.5 tablet Oral TID  . metolazone  2.5 mg Oral Once  . potassium chloride  40 mEq Oral BID  . sodium chloride flush  10-40 mL Intracatheter Q12H  . sodium chloride flush  3 mL Intravenous Q12H  . spironolactone  12.5 mg Oral Daily   Continuous Infusions: . milrinone 0.25 mcg/kg/min (07/25/15 2257)   PRN Meds:.sodium chloride, acetaminophen, ondansetron (ZOFRAN) IV, sodium chloride flush, sodium chloride flush   Filed Vitals:   07/26/15 0400 07/26/15 0500 07/26/15 0600 07/26/15 0725  BP: 89/53   105/58  Pulse:    96  Temp: 99.2 F (37.3 C)     TempSrc: Oral     Resp: 19 20 19 17   Height:      Weight:  207 lb 10.8 oz (94.2 kg)    SpO2: 97%   98%    Intake/Output Summary (Last 24 hours) at 07/26/15 0746 Last data filed at 07/26/15 0700  Gross per 24 hour  Intake  898.8 ml  Output   3250 ml  Net -2351.2 ml    LABS: Basic Metabolic Panel:  Recent Labs  40/98/1106/26/17 0343 07/26/15 0336  NA 137 137  K  4.1 4.4  CL 100* 95*  CO2 30 32  GLUCOSE 78 132*  BUN 31* 29*  CREATININE 1.86* 2.08*  CALCIUM 9.1 9.6   Liver Function Tests: No results for input(s): AST, ALT, ALKPHOS, BILITOT, PROT, ALBUMIN in the last 72 hours. No results for input(s): LIPASE, AMYLASE in the last 72 hours. CBC:  Recent Labs  07/24/15 0400 07/26/15 0336  WBC 4.8 4.7  HGB 9.3* 9.4*  HCT 30.0* 30.2*  MCV 94.6 94.7  PLT 146* 165   Cardiac Enzymes: No results for input(s): CKTOTAL, CKMB, CKMBINDEX, TROPONINI in the last 72 hours. BNP: Invalid input(s): POCBNP D-Dimer: No results for input(s): DDIMER in the last 72 hours. Hemoglobin A1C: No results for input(s): HGBA1C in the last 72 hours. Fasting Lipid Panel: No results for input(s): CHOL, HDL, LDLCALC, TRIG, CHOLHDL, LDLDIRECT in the last 72 hours. Thyroid Function Tests: No results for input(s): TSH, T4TOTAL, T3FREE, THYROIDAB in the last 72 hours.  Invalid input(s): FREET3 Anemia Panel: No results for input(s): VITAMINB12, FOLATE, FERRITIN, TIBC, IRON, RETICCTPCT in the last 72 hours.  RADIOLOGY: [place] Renal  07/24/2015  CLINICAL DATA:  Acute renal failure. EXAM: RENAL / URINARY TRACT ULTRASOUND COMPLETE COMPARISON:  None. FINDINGS:  Right Kidney: Length: 10.2 cm. There is a 3.2 x 3.2 x 3 cm mass in the mid right kidney. This does is not demonstrate the appearance of a simple cyst and may contain solid components. No hydronephrosis. Cortical thinning is identified. Left Kidney: Length: 10.1 cm.  Cortical thinning with no hydronephrosis. Bladder: Appears normal for degree of bladder distention. IMPRESSION: 1. There is a mass in the right kidney which does not have the appearance of a simple cyst and may contain solid components. This raises the possibility of a renal neoplasm. This should be further assessed with a contrast-enhanced CT scan or MRI. MRI would be the most sensitive and specific exam. Electronically Signed   By: Gerome Samavid  Williams III M.D   On:  07/24/2015 19:09   Dg Chest Port 1 View  07/22/2015  CLINICAL DATA:  Acute on chronic CHF, mitral and aortic insufficiency, coronary artery disease, former smoker, obesity. EXAM: PORTABLE CHEST 1 VIEW COMPARISON:  Chest x-ray of December 04, 2010 FINDINGS: The lungs are adequately inflated. There is no focal infiltrate and no pleural effusion. The cardiac silhouette is enlarged. The pulmonary vascularity is engorged with mild cephalization observed. There are post CABG changes. There is calcification in the aortic arch. The PICC line tip projects over the midportion of the SVC. The observed bony thorax exhibits no acute abnormality. IMPRESSION: 1. CHF with mild pulmonary vascular congestion. There is no pneumonia. 2. Aortic atherosclerosis. Electronically Signed   By: David  Swazil[place]Jordan M.D.   On: 07/22/2015 07:33    PHYSICAL EXAM General: NAD. Sitting in chair eating lunch Neck: JVP 14 cm, no thyromegaly or thyroid nodule.  Lungs: Clear to auscultation bilaterally with normal respiratory effort. CV: Lateral PMI.  Heart mildly tachy, regular S1/S2, no S3/S4, no murmur.  1+ edema to knees.   Abdomen: Soft, nontender, no hepatosplenomegaly, no distention.  Neurologic: Alert and oriented x 3.  Psych: Normal affect. Extremities: No clubbing or cyanosis.   TELEMETRY: Reviewed telemetry NSR  ASSESSMENT AND PLAN: 75 yo with ischemic CMP and CAD s/p CABG admitted 6/22 after RHC with significant volume overload.  1. Acute on chronic systolic CHF: Ischemic cardiomyopathy. Echo with EF 25-30% and moderately decreased RV systolic function this admission. Medicine titration limited by low BP and renal dysfunction. She had RHC 6/22 => Cardiac output was relatively preserved but she was significantly volume overloaded.  Milrinone was started given renal dysfunction and need for considerable diuresis.  Today, co-ox 62% with CVP still about 13-14.  Better diuresis with Lasix 160 mg IV tid + metolazone yesterday but  still has fluid to get off. - Still volume overloaded. Will give 1 dose metolazone 2.5 and continue current Lasix. Watch creatinine closely.  - Continue milrinone while aggressively diuresing.   - Continue Bidil 1/2 tablet tid and spiro 12.5. SBP in 90s-100s, if drops any lower will need to hold Bidil.  - Digoxin level 1.0, decrease digoxin to 0.0625 daily.   - Continue Ted hose.  - No beta blocker or ACE/ARB for now.  - ECG with LBBB, QRS 158 msec. Will need to consider CRT-D when more stable.  2. CAD: s/p CABG. No chest pain [facility]. Given fall in EF, concerned for worsened CAD. Ideally, she would have coronary angiography. If creatinine remains stable/improves, could do coronary angiography at some point in the future. Continue ASA 81 and atorvastatin.  3. CKD: Stage IV.Creatinine 2.08 today.  Follow carefully with diuresis.  Suspect CKD  is major factor in limited diuresis.  4. Renal mass: Mass in right kidney, ?renal neoplasm.  Suggest further evaluation with CTA or MRI.  Creatinine up, suspect MRI is best if we can avoid contrast => to be done this morning.   5. Work with PT/rehab.    Marca Ancona MD 07/26/2015 7:46 AM

## 2015-07-27 ENCOUNTER — Ambulatory Visit: Payer: Self-pay | Admitting: *Deleted

## 2015-07-27 LAB — CARBOXYHEMOGLOBIN
CARBOXYHEMOGLOBIN: 2.2 % — AB (ref 0.5–1.5)
METHEMOGLOBIN: 0.8 % (ref 0.0–1.5)
O2 Saturation: 65.1 %
Total hemoglobin: 9.9 g/dL — ABNORMAL LOW (ref 12.0–16.0)

## 2015-07-27 LAB — BASIC METABOLIC PANEL
ANION GAP: 10 (ref 5–15)
Anion gap: 11 (ref 5–15)
BUN: 29 mg/dL — AB (ref 6–20)
BUN: 26 mg/dL — ABNORMAL HIGH (ref 6–20)
CALCIUM: 9.7 mg/dL (ref 8.9–10.3)
CHLORIDE: 94 mmol/L — AB (ref 101–111)
CO2: 30 mmol/L (ref 22–32)
CO2: 30 mmol/L (ref 22–32)
Calcium: 9.4 mg/dL (ref 8.9–10.3)
Chloride: 95 mmol/L — ABNORMAL LOW (ref 101–111)
Creatinine, Ser: 1.93 mg/dL — ABNORMAL HIGH (ref 0.44–1.00)
Creatinine, Ser: 1.97 mg/dL — ABNORMAL HIGH (ref 0.44–1.00)
GFR calc Af Amer: 29 mL/min — ABNORMAL LOW (ref >60)
GFR calc non Af Amer: 25 mL/min — ABNORMAL LOW (ref >60)
GFR, EST AFRICAN AMERICAN: 28 mL/min — AB (ref >60)
GFR, EST NON AFRICAN AMERICAN: 24 mL/min — AB (ref >60)
Glucose, Bld: 104 mg/dL — ABNORMAL HIGH (ref 65–99)
Glucose, Bld: 77 mg/dL (ref 65–99)
POTASSIUM: 4.4 mmol/L (ref 3.5–5.1)
Potassium: 4.2 mmol/L (ref 3.5–5.1)
SODIUM: 135 mmol/L (ref 135–145)
Sodium: 135 mmol/L (ref 135–145)

## 2015-07-27 LAB — GLUCOSE, CAPILLARY
GLUCOSE-CAPILLARY: 125 mg/dL — AB (ref 65–99)
GLUCOSE-CAPILLARY: 75 mg/dL (ref 65–99)
GLUCOSE-CAPILLARY: 172 mg/dL — AB (ref 65–99)
Glucose-Capillary: 170 mg/dL — ABNORMAL HIGH (ref 65–99)

## 2015-07-27 LAB — CBC
HEMATOCRIT: 30.5 % — AB (ref 36.0–46.0)
Hemoglobin: 9.4 g/dL — ABNORMAL LOW (ref 12.0–15.0)
MCH: 29.2 pg (ref 26.0–34.0)
MCHC: 30.8 g/dL (ref 30.0–36.0)
MCV: 94.7 fL (ref 78.0–100.0)
Platelets: 169 10*3/uL (ref 150–400)
RBC: 3.22 MIL/uL — ABNORMAL LOW (ref 3.87–5.11)
RDW: 15.4 % (ref 11.5–15.5)
WBC: 4.5 10*3/uL (ref 4.0–10.5)

## 2015-07-27 MED ORDER — METOLAZONE 5 MG PO TABS
5.0000 mg | ORAL_TABLET | Freq: Once | ORAL | Status: AC
  Administered 2015-07-27: 5 mg via ORAL
  Filled 2015-07-27: qty 1

## 2015-07-27 NOTE — Care Management Important Message (Signed)
Important Message  Patient Details  Name: [name] [name] MRN: [id number] Date of Birth: 11/17/1940   Medicare Important Message Given:  Yes    Bernadette HoitShoffner, Trypp Heckmann Coleman 07/27/2015, 9:54 AM

## 2015-07-27 NOTE — Progress Notes (Signed)
Patient ID: [name] [name], female   DOB: 04/16/1940, 75 y.o.   MRN: [id number]   SUBJECTIVE:  Diuresis good yesterday on high dose Lasix + metolazone.  Creatinine 2.08 => 1.93.  CVP remains 13-14 today.   Echo: EF 25-30% with multiple wall motion abnormalities, moderately dilated RV with moderately decreased RV systolic function, moderate MR, PASP 54 mmHg.   Abdominal MRI with complex right upper kidney cystic mass.   RHC Procedural Findings (07/21/15): Hemodynamics (mmHg) RA mean 17 RV 51/21 PA 57/32, mean 45 PCWP mean 27  Oxygen saturations: PA 62% AO 100%  Cardiac Output (Fick) 4.5  Cardiac Index (Fick) 2.3 PVR 4 WU      Scheduled Meds: . aspirin EC  81 mg Oral Daily  . atorvastatin  40 mg Oral q1800  . digoxin  0.0625 mg Oral Daily  . furosemide  160 mg Intravenous TID AC  . heparin  5,000 Units Subcutaneous Q8H  . insulin aspart  0-15 Units Subcutaneous TID WC  . insulin aspart  0-5 Units Subcutaneous QHS  . insulin aspart protamine- aspart  25 Units Subcutaneous BID WC  . isosorbide-hydrALAZINE  0.5 tablet Oral TID  . metolazone  5 mg Oral Once  . potassium chloride  40 mEq Oral BID  . sodium chloride flush  10-40 mL Intracatheter Q12H  . sodium chloride flush  3 mL Intravenous Q12H  . spironolactone  12.5 mg Oral Daily   Continuous Infusions: . milrinone 0.25 mcg/kg/min (07/27/15 0115)   PRN Meds:.sodium chloride, acetaminophen, ondansetron (ZOFRAN) IV, sodium chloride flush, sodium chloride flush   Filed Vitals:   07/26/15 2001 07/26/15 2335 07/26/15 2349 07/27/15 0428  BP: 90/55 98/64  87/54  Pulse:    87  Temp: 98.7 F (37.1 C)  98.2 F (36.8 C) 98.6 F (37 C)  TempSrc: Oral  Oral Oral  Resp: Height:      Weight:    207 lb 10.8 oz (94.2 kg)  SpO2: 98%   97%    Intake/Output Summary (Last 24 hours) at 07/27/15 0756 Last data filed at 07/27/15 0700  Gross per 24 hour  Intake  785.6 ml  Output   3650 ml  Net -2864.4 ml     LABS: Basic Metabolic Panel:  Recent Labs  40/98/11 0336 07/27/15 0431  NA 137 135  K 4.4 4.4  CL 95* 94*  CO2 32 30  GLUCOSE 132* 104*  BUN 29* 29*  CREATININE 2.08* 1.93*  CALCIUM 9.6 9.4   Liver Function Tests: No results for input(s): AST, ALT, ALKPHOS, BILITOT, PROT, ALBUMIN in the last 72 hours. No results for input(s): LIPASE, AMYLASE in the last 72 hours. CBC:  Recent Labs  07/26/15 0336 07/27/15 0431  WBC 4.7 4.5  HGB 9.4* 9.4*  HCT 30.2* 30.5*  MCV 94.7 94.7  PLT 165 169   Cardiac Enzymes: No results for input(s): CKTOTAL, CKMB, CKMBINDEX, TROPONINI in the last 72 hours. BNP: Invalid input(s): POCBNP D-Dimer: No results for input(s): DDIMER in the last 72 hours. Hemoglobin A1C: No results for input(s): HGBA1C in the last 72 hours. Fasting Lipid Panel: No results for input(s): CHOL, HDL, LDLCALC, TRIG, CHOLHDL, LDLDIRECT in the last 72 hours. Thyroid Function Tests: No results for input(s): TSH, T4TOTAL, T3FREE, THYROIDAB in the last 72 hours.  Invalid input(s): FREET3 Anemia Panel: No results for input(s): VITAMINB12, FOLATE, FERRITIN, TIBC, IRON, RETICCTPCT in the last 72 hours.  RADIOLOGY: Mr Abdomen Wo Contrast  07/26/2015  CLINICAL DATA:  75 year old female inpatient with indeterminate right renal mass on renal ultrasound. EXAM: MRI ABDOMEN WITHOUT CONTRAST TECHNIQUE: Multiplanar multisequence MR imaging was performed without the administration of intravenous contrast. COMPARISON:  07/24/2015 renal sonogram . FINDINGS: Lower chest: Clear lung bases. Cardiomegaly. Susceptibility artifact from sternotomy wires. Hepatobiliary: Normal liver size and configuration. No hepatic steatosis. No liver mass. Focal adenomyomatosis in the fundus of the gallbladder. Otherwise normal gallbladder with no cholelithiasis. No biliary ductal dilatation. Common bile duct diameter 4 mm. No choledocholithiasis. Pancreas: No pancreatic mass or duct dilation.  No pancreas  divisum. Spleen: Normal size. No mass. Adrenals/Urinary Tract: Normal adrenals. No hydronephrosis. There is a complex 3.6 x 3.2 x 3.5 cm cystic renal mass in the lateral upper right kidney (series 13/ image 12), incompletely characterized on this noncontrast MRI, which demonstrates thickened internal septations, scattered internal subcentimeter nodular foci, and heterogeneous internal areas of restricted diffusion, suspicious for renal cell carcinoma. There are several similar-appearing homogeneous T1 hyperintense T2 hypointense renal cortical lesions in both kidneys, largest 1.1 cm in the posterior upper right kidney and 1.0 cm in the posterior upper left kidney, which are incompletely characterized on this noncontrast MRI, and are most suggestive of hemorrhagic/proteinaceous renal cysts. There is a simple appearing 0.9 cm renal cortical cyst in the mid to upper left kidney. Stomach/Bowel: Grossly normal stomach. Visualized small and large bowel is normal caliber, with no bowel wall thickening. Vascular/Lymphatic: Normal caliber abdominal aorta. No pathologically enlarged lymph nodes in the abdomen. Other: No abdominal ascites or focal fluid collection. Musculoskeletal: No aggressive appearing focal osseous lesions. IMPRESSION: 1. 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 Urology consultation advised. 2. Several small T1 hyperintense renal cortical lesions in both kidneys, incompletely characterized on this noncontrast MRI, most suggestive of hemorrhagic/proteinaceous renal cysts. Follow-up renal protocol MRI abdomen is recommended in 6 months to reassess these renal lesions, preferably without and with IV contrast if the patient's renal function has improved. 3. No noncontrast MRI evidence of metastatic disease in the abdomen. 4. Focal adenomyomatosis of the gallbladder fundus. Otherwise normal gallbladder, with no  cholelithiasis. No biliary ductal dilatation. 5. Cardiomegaly. Electronically Signed   By: Delbert PhenixJason A Poff M.D.   On: 07/26/2015 11:44   [place] Renal  07/24/2015  CLINICAL DATA:  Acute renal failure. EXAM: RENAL / URINARY TRACT ULTRASOUND COMPLETE COMPARISON:  None. FINDINGS: Right Kidney: Length: 10.2 cm. There is a 3.2 x 3.2 x 3 cm mass in the mid right kidney. This does is not demonstrate the appearance of a simple cyst and may contain solid components. No hydronephrosis. Cortical thinning is identified. Left Kidney: Length: 10.1 cm.  Cortical thinning with no hydronephrosis. Bladder: Appears normal for degree of bladder distention. IMPRESSION: 1. There is a mass in the right kidney which does not have the appearance of a simple cyst and may contain solid components. This raises the possibility of a renal neoplasm. This should be further assessed with a contrast-enhanced CT scan or MRI. MRI would be the most sensitive and specific exam. Electronically Signed   By: Gerome Samavid  Williams III M.D   On: 07/24/2015 19:09   Dg Chest Port 1 View  07/22/2015  CLINICAL DATA:  Acute on chronic CHF, mitral and aortic insufficiency, coronary artery disease, former smoker, obesity. EXAM: PORTABLE CHEST 1 VIEW COMPARISON:  Chest x-ray of December 04, 2010 FINDINGS: The lungs are adequately inflated. There is no focal infiltrate  and no pleural effusion. The cardiac silhouette is enlarged. The pulmonary vascularity is engorged with mild cephalization observed. There are post CABG changes. There is calcification in the aortic arch. The PICC line tip projects over the midportion of the SVC. The observed bony thorax exhibits no acute abnormality. IMPRESSION: 1. CHF with mild pulmonary vascular congestion. There is no pneumonia. 2. Aortic atherosclerosis. Electronically Signed   By: David  [place]il[place]Jordan M.D.   On: 07/22/2015 07:33    PHYSICAL EXAM General: NAD.  Neck: JVP 12+ cm, no thyromegaly or thyroid nodule.  Lungs: Clear to  auscultation bilaterally with normal respiratory effort. CV: Lateral PMI.  Heart mildly tachy, regular S1/S2, no S3/S4, no murmur.  1+ ankle edema  Abdomen: Soft, nontender, no hepatosplenomegaly, no distention.  Neurologic: Alert and oriented x 3.  Psych: Normal affect. Extremities: No clubbing or cyanosis.   TELEMETRY: Reviewed telemetry NSR  ASSESSMENT AND PLAN: 75 yo with ischemic CMP and CAD s/p CABG admitted 6/22 after RHC with significant volume overload.  1. Acute on chronic systolic CHF: Ischemic cardiomyopathy. Echo with EF 25-30% and moderately decreased RV systolic function this admission. Medicine titration limited by low BP and renal dysfunction. She had RHC 6/22 => Cardiac output was relatively preserved but she was significantly volume overloaded.  Milrinone was started given renal dysfunction and need for considerable diuresis.  Today, co-ox 65% with CVP still about 13-14.  Good diuresis with Lasix 160 mg IV tid + metolazone yesterday but still has fluid to get off. - Still volume overloaded. Will give 1 dose metolazone 5 mg po and continue current Lasix. Watch creatinine closely (lower today).  - Continue milrinone while aggressively diuresing.   - Continue Bidil 1/2 tablet tid and spiro 12.5. SBP in 90s-100s, if drops any lower will need to hold Bidil.  - Digoxin decreased to 0.0625 daily, check level again Friday.   - Continue Ted hose.  - No beta blocker or ACE/ARB for now.  - ECG with LBBB, QRS 158 msec. Will need to consider CRT-D when more stable.  2. CAD: s/p CABG. No chest pain since [facility]. Given fall in EF, concerned for worsened CAD. Ideally, she would have coronary angiography.  However, given elevated creatinine and lack of chest pain, would hold off for now. Continue ASA 81 and atorvastatin.  3. CKD: Stage IV.Creatinine down a bit to 1.93 today.  Follow carefully with diuresis.  Suspect CKD is major factor in limited  diuresis.  4. Renal mass: Cystic mass right kidney on MRI, concerning for renal cell carcinoma.  Will consult urology.   5. Work with PT/rehab.    Marca Anconaalton Janira Mandell MD 07/27/2015 7:56 AM

## 2015-07-27 NOTE — Progress Notes (Signed)
Physical Therapy Treatment Patient Details Name: [name] [name] MRN: [id number] DOB: 11/24/1940 Today's Date: 07/27/2015    History of Present Illness Pt is a 75 y/o F who presented w/ dyspnea and significant LE edema.  Pt volume overloaded w/ acute on chronic systolic CHF.  Pt's PMH includes CABG, stage III CKD, DM II, sinus bradycardia, obesity.    PT Comments    [name] [name] continues to make good progress.  She performed exercises sitting and in standing before ambulating 150 ft in hallway w/ RW.  Pt took one standing rest break due to SOB, SpO2 remained at or above 93% on RA.  Pt will benefit from continued skilled PT services to increase functional independence and safety.   Follow Up Recommendations  Home health PT;Supervision for mobility/OOB     Equipment Recommendations  None recommended by PT    Recommendations for Other Services OT consult     Precautions / Restrictions Precautions Precautions: Fall Precaution Comments: monitor O2 Restrictions Weight Bearing Restrictions: No    Mobility  Bed Mobility               General bed mobility comments: Pt sitting in recliner chair upon PT arrival  Transfers Overall transfer level: Needs assistance Equipment used: Rolling walker (2 wheeled) Transfers: Sit to/from Stand Sit to Stand: Supervision         General transfer comment: Supervision for safety.  Cues for hand placement   Ambulation/Gait Ambulation/Gait assistance: Supervision Ambulation Distance (Feet): 150 Feet Assistive device: Rolling walker (2 wheeled) Gait Pattern/deviations: Step-through pattern;Decreased stride length   Gait velocity interpretation: Below normal speed for age/gender General Gait Details: Cues for forward gaze.  SpO2 remains at or above 93% on RA.  Pt takes one standing rest break due to SOB.    Stairs            Wheelchair Mobility    Modified Rankin (Stroke Patients Only)       Balance Overall balance  assessment: Needs assistance Sitting-balance support: No upper extremity supported;Feet supported Sitting balance-Leahy Scale: Good     Standing balance support: No upper extremity supported;During functional activity Standing balance-Leahy Scale: Fair Standing balance comment: Pt stands at sink without UEs supported to wash hands                    Cognition Arousal/Alertness: Awake/alert Behavior During Therapy: WFL for tasks assessed/performed Overall Cognitive Status: Within Functional Limits for tasks assessed                      Exercises General Exercises - Lower Extremity Ankle Circles/Pumps: AROM;Both;15 reps;Seated Long Arc Quad: AROM;Both;10 reps;Seated Hip Flexion/Marching: Both;10 reps;Standing Mini-Sqauts: Strengthening;10 reps;Standing Other Exercises Other Exercises: Pt encouraged to ambulate 3x/day with nursing staff    General Comments        Pertinent Vitals/Pain Pain Assessment: No/denies pain    Home Living                      Prior Function            PT Goals (current goals can now be found in the care plan section) Acute Rehab PT Goals Patient Stated Goal: to feel better and go home PT Goal Formulation: With patient Time For Goal Achievement: 08/08/15 Potential to Achieve Goals: Good Progress towards PT goals: Progressing toward goals    Frequency  Min 3X/week    PT Plan Current plan remains appropriate  Co-evaluation             End of Session Equipment Utilized During Treatment: Gait belt Activity Tolerance: Patient tolerated treatment well;Patient limited by fatigue Patient left: in chair;with call bell/phone within reach     Time: 1021-1046 PT Time Calculation (min) (ACUTE ONLY): 25 min  Charges:  $Gait Training: 8-22 mins $Therapeutic Exercise: 8-22 mins                    G Codes:      Encarnacion ChuAshley Azarian Starace PT, DPT  Pager: [phone] Phone: [phone] 07/27/2015, 10:57 AM

## 2015-07-27 NOTE — Consult Note (Signed)
Urology Consult   Physician requesting consult: Dr. Marca Ancona  Reason for consult: Right renal mass  History of Present Illness: [name] [name] is a 75 year old female seen at the request of Dr. Shirlee Latch for incidentally detected right renal mass.  She was admitted to the hospital with congestive heart failure related to ischemic cardiomyopathy.  She was noted to have renal insufficiency and underwent a renal ultrasound for further evaluation.  Of note, her current serum creatinine is 1.9.  This does appear to be consistent with her baseline.  On renal ultrasonography, she was noted to have a 3.3 cm complex mass of the right kidney which did not appear consistent with a simple cyst.  Due to her renal function, she underwent further imaging with an MRI without IV contrast which confirmed a 3.6 cm lateral interpolar complex cystic lesion of the right kidney.  No regional lymphadenopathy or other concerning renal masses were identified.  No metastatic disease to the abdomen was noted.  She denies any hematuria, unintentional weight loss, or abdominal pain symptoms.  There is no family history of kidney cancer and no family history of end-stage renal disease.    Past Medical History  Diagnosis Date  . Obesity   . Essential hypertension   . Hyperlipidemia   . Mitral valve insufficiency and aortic valve insufficiency     a. Mild-mod MR by echo 07/2014.  Marland Kitchen CAD (coronary artery disease)     a. s/p 3V CABG 2002 (SVG-LAD, SVG-OM, SVG-PDA).  . Diabetes mellitus (HCC)   . Chronic systolic CHF (congestive heart failure) (HCC)     a. EF 40%.  . Ischemic cardiomyopathy   . Sinus bradycardia   . Hypotension     Past Surgical History  Procedure Laterality Date  . Coronary artery bypass graft  08/13/2000     [name]  . Cardiac catheterization N/A 07/21/2015    Procedure: Right Heart Cath;  Surgeon: Laurey Morale, MD;  Location: [facility] INVASIVE CV LAB;  Service: Cardiovascular;   Laterality: N/A;     Current Hospital Medications:  Home meds:    Medication List    ASK your doctor about these medications        aspirin 81 MG tablet  Take 81 mg by mouth daily.     atorvastatin 40 MG tablet  Commonly known as:  LIPITOR  Take 1 tablet by mouth  daily     D-3-5 5000 units capsule  Generic drug:  Cholecalciferol  Take 5,000 Units by mouth daily.     Fish Oil 1200 MG Caps  Take 1,200-2,400 mg by mouth 2 (two) times daily. Take 2 tabs by mouth every morning & 1 tab every evening     furosemide 40 MG tablet  Commonly known as:  LASIX  Take 40 mg by mouth 2 (two) times daily.     HUMALOG MIX 75/25 (75-25) 100 UNIT/ML Susp injection  Generic drug:  insulin lispro protamine-lispro  Inject 25 Units into the skin 2 (two) times daily.     isosorbide-hydrALAZINE 20-37.5 MG tablet  Commonly known as:  BIDIL  Take 0.5 tablets by mouth 3 (three) times daily.     nitroGLYCERIN 0.4 MG SL tablet  Commonly known as:  NITROSTAT  Place 1 tablet (0.4 mg total) under the tongue every 5 (five) minutes as needed.     ranitidine 150 MG tablet  Commonly known as:  ZANTAC  Take 1 tablet by mouth two  times daily  Scheduled Meds: . aspirin EC  81 mg Oral Daily  . atorvastatin  40 mg Oral q1800  . digoxin  0.0625 mg Oral Daily  . furosemide  160 mg Intravenous TID AC  . heparin  5,000 Units Subcutaneous Q8H  . insulin aspart  0-15 Units Subcutaneous TID WC  . insulin aspart  0-5 Units Subcutaneous QHS  . insulin aspart protamine- aspart  25 Units Subcutaneous BID WC  . isosorbide-hydrALAZINE  0.5 tablet Oral TID  . potassium chloride  40 mEq Oral BID  . sodium chloride flush  10-40 mL Intracatheter Q12H  . sodium chloride flush  3 mL Intravenous Q12H  . spironolactone  12.5 mg Oral Daily   Continuous Infusions: . milrinone 0.25 mcg/kg/min (07/27/15 1200)   PRN Meds:.sodium chloride, acetaminophen, ondansetron (ZOFRAN) IV, sodium chloride flush, sodium  chloride flush  Allergies:  Allergies  Allergen Reactions  . Spironolactone     Not allergic but had worsening of renal function while on this.  . Sulfonamide Derivatives     REACTION: stomach upset    Family History  Problem Relation Age of Onset  . Hypertension      Social History:  reports that she quit smoking about 19 years ago. Her smoking use included Cigarettes. She started smoking about 34 years ago. She has a 22.5 pack-year smoking history. She has never used smokeless tobacco. She reports that she does not drink alcohol or use illicit drugs.  ROS: A complete review of systems was performed.  All systems are negative except for pertinent findings as noted.  Physical Exam:  Vital signs in last 24 hours: Temp:  [97.2 F (36.2 C)-98.7 F (37.1 C)] 97.2 F (36.2 C) (06/28 1125) Pulse Rate:  [87-97] 91 (06/28 1125) Resp:  [16-27] 20 (06/28 1200) BP: (87-100)/(53-64) 89/53 mmHg (06/28 1125) SpO2:  [96 %-99 %] 99 % (06/28 1125) Weight:  [94.2 kg (207 lb 10.8 oz)] 94.2 kg (207 lb 10.8 oz) (06/28 0428) Constitutional:  Alert and oriented, No acute distress Cardiovascular: Regular rate and rhythm, No JVD Respiratory: Normal respiratory effort, Lungs clear bilaterally GI: Abdomen is soft, nontender, nondistended, no abdominal masses GU: No CVA tenderness Lymphatic: No lymphadenopathy Neurologic: Grossly intact, no focal deficits Psychiatric: Normal mood and affect  Laboratory Data:   Recent Labs  07/26/15 0336 07/27/15 0431  WBC 4.7 4.5  HGB 9.4* 9.4*  HCT 30.2* 30.5*  PLT 165 169     Recent Labs  07/25/15 0343 07/26/15 0336 07/27/15 0431 07/27/15 1120  NA 137 137 135 135  K 4.1 4.4 4.4 4.2  CL 100* 95* 94* 95*  GLUCOSE 78 132* 104* 77  BUN 31* 29* 29* 26*  CALCIUM 9.1 9.6 9.4 9.7  CREATININE 1.86* 2.08* 1.93* 1.97*     Results for orders placed or performed during the hospital encounter of 07/21/15 (from the past 24 hour(s))  Glucose, capillary      Status: Abnormal   Collection Time: 07/26/15  4:24 PM  Result Value Ref Range   Glucose-Capillary 238 (H) 65 - 99 mg/dL   Comment 1 Notify RN   Glucose, capillary     Status: Abnormal   Collection Time: 07/26/15  9:48 PM  Result Value Ref Range   Glucose-Capillary 145 (H) 65 - 99 mg/dL   Comment 1 Capillary Specimen   Basic metabolic panel     Status: Abnormal   Collection Time: 07/27/15  4:31 AM  Result Value Ref Range   Sodium 135 135 - 145  mmol/L   Potassium 4.4 3.5 - 5.1 mmol/L   Chloride 94 (L) 101 - 111 mmol/L   CO2 30 22 - 32 mmol/L   Glucose, Bld 104 (H) 65 - 99 mg/dL   BUN 29 (H) 6 - 20 mg/dL   Creatinine, Ser 1.61 (H) 0.44 - 1.00 mg/dL   Calcium 9.4 8.9 - 09.6 mg/dL   GFR calc non Af Amer 25 (L) >60 mL/min   GFR calc Af Amer 29 (L) >60 mL/min   Anion gap 11 5 - 15  CBC     Status: Abnormal   Collection Time: 07/27/15  4:31 AM  Result Value Ref Range   WBC 4.5 4.0 - 10.5 K/uL   RBC 3.22 (L) 3.87 - 5.11 MIL/uL   Hemoglobin 9.4 (L) 12.0 - 15.0 g/dL   HCT 04.5 (L) 40.9 - 81.1 %   MCV 94.7 78.0 - 100.0 fL   MCH 29.2 26.0 - 34.0 pg   MCHC 30.8 30.0 - 36.0 g/dL   RDW 91.4 78.2 - 95.6 %   Platelets 169 150 - 400 K/uL  Carboxyhemoglobin     Status: Abnormal   Collection Time: 07/27/15  4:35 AM  Result Value Ref Range   Total hemoglobin 9.9 (L) 12.0 - 16.0 g/dL   O2 Saturation 21.3 %   Carboxyhemoglobin 2.2 (H) 0.5 - 1.5 %   Methemoglobin 0.8 0.0 - 1.5 %  Glucose, capillary     Status: Abnormal   Collection Time: 07/27/15  8:22 AM  Result Value Ref Range   Glucose-Capillary 125 (H) 65 - 99 mg/dL  Basic metabolic panel     Status: Abnormal   Collection Time: 07/27/15 11:20 AM  Result Value Ref Range   Sodium 135 135 - 145 mmol/L   Potassium 4.2 3.5 - 5.1 mmol/L   Chloride 95 (L) 101 - 111 mmol/L   CO2 30 22 - 32 mmol/L   Glucose, Bld 77 65 - 99 mg/dL   BUN 26 (H) 6 - 20 mg/dL   Creatinine, Ser 0.86 (H) 0.44 - 1.00 mg/dL   Calcium 9.7 8.9 - 57.8 mg/dL   GFR  calc non Af Amer 24 (L) >60 mL/min   GFR calc Af Amer 28 (L) >60 mL/min   Anion gap 10 5 - 15  Glucose, capillary     Status: None   Collection Time: 07/27/15 11:22 AM  Result Value Ref Range   Glucose-Capillary 75 65 - 99 mg/dL   Recent Results (from the past 240 hour(s))  MRSA PCR Screening     Status: None   Collection Time: 07/21/15  2:30 PM  Result Value Ref Range Status   MRSA by PCR NEGATIVE NEGATIVE Final    Comment:        The GeneXpert MRSA Assay (FDA approved for NASAL specimens only), is one component of a comprehensive MRSA colonization surveillance program. It is not intended to diagnose MRSA infection nor to guide or monitor treatment for MRSA infections.     Renal Function:  Recent Labs  07/22/15 0400 07/23/15 0502 07/24/15 0400 07/25/15 0343 07/26/15 0336 07/27/15 0431 07/27/15 1120  CREATININE 1.84* 1.82* 2.10* 1.86* 2.08* 1.93* 1.97*   Estimated Creatinine Clearance: 27.6 mL/min (by C-G formula based on Cr of 1.97).  Radiologic Imaging: Mr Abdomen Wo Contrast  07/26/2015  CLINICAL DATA:  75 year old female inpatient with indeterminate right renal mass on renal ultrasound. EXAM: MRI ABDOMEN WITHOUT CONTRAST TECHNIQUE: Multiplanar multisequence MR imaging was performed without the administration of  intravenous contrast. COMPARISON:  07/24/2015 renal sonogram . FINDINGS: Lower chest: Clear lung bases. Cardiomegaly. Susceptibility artifact from sternotomy wires. Hepatobiliary: Normal liver size and configuration. No hepatic steatosis. No liver mass. Focal adenomyomatosis in the fundus of the gallbladder. Otherwise normal gallbladder with no cholelithiasis. No biliary ductal dilatation. Common bile duct diameter 4 mm. No choledocholithiasis. Pancreas: No pancreatic mass or duct dilation.  No pancreas divisum. Spleen: Normal size. No mass. Adrenals/Urinary Tract: Normal adrenals. No hydronephrosis. There is a complex 3.6 x 3.2 x 3.5 cm cystic renal mass in the  lateral upper right kidney (series 13/ image 12), incompletely characterized on this noncontrast MRI, which demonstrates thickened internal septations, scattered internal subcentimeter nodular foci, and heterogeneous internal areas of restricted diffusion, suspicious for renal cell carcinoma. There are several similar-appearing homogeneous T1 hyperintense T2 hypointense renal cortical lesions in both kidneys, largest 1.1 cm in the posterior upper right kidney and 1.0 cm in the posterior upper left kidney, which are incompletely characterized on this noncontrast MRI, and are most suggestive of hemorrhagic/proteinaceous renal cysts. There is a simple appearing 0.9 cm renal cortical cyst in the mid to upper left kidney. Stomach/Bowel: Grossly normal stomach. Visualized small and large bowel is normal caliber, with no bowel wall thickening. Vascular/Lymphatic: Normal caliber abdominal aorta. No pathologically enlarged lymph nodes in the abdomen. Other: No abdominal ascites or focal fluid collection. Musculoskeletal: No aggressive appearing focal osseous lesions. IMPRESSION: 1. 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 Urology consultation advised. 2. Several small T1 hyperintense renal cortical lesions in both kidneys, incompletely characterized on this noncontrast MRI, most suggestive of hemorrhagic/proteinaceous renal cysts. Follow-up renal protocol MRI abdomen is recommended in 6 months to reassess these renal lesions, preferably without and with IV contrast if the patient's renal function has improved. 3. No noncontrast MRI evidence of metastatic disease in the abdomen. 4. Focal adenomyomatosis of the gallbladder fundus. Otherwise normal gallbladder, with no cholelithiasis. No biliary ductal dilatation. 5. Cardiomegaly. Electronically Signed   By: Delbert PhenixJason A Poff M.D.   On: 07/26/2015 11:44    I independently reviewed the  above imaging studies.  Impression/Recommendation: Right renal neoplasm: I explained to Ms. 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  I explained that I cannot fully evaluate this mass radiographically considering her inability to receive intravenous contrast.  However, she understands that cystic renal cell carcinomas are typically low-grade with a low potential for developing metastatic disease.  Considering her other health issues, I have recommended that we plan to have her follow-up in approximately 8-10 weeks to further assess her overall medical condition and renal function.  We'll then make further recommendations regarding further imaging or intervention although she may be best served with surveillance/observation considering her medical comorbidities.  She expressed her understanding.  We will arrange outpatient follow-up.    Davey Bergsma,LES 07/27/2015, 1:04 PM  Moody BruinsLester S. Marvie Calender Jr. MD   CC: Dr. Marca Anconaalton McLean

## 2015-07-27 NOTE — Progress Notes (Signed)
CARDIAC REHAB PHASE I   PRE:  Rate/Rhythm: 98 SR  BP:  Supine:   Sitting: 85/53  Standing:    SaO2: 95%RA  MODE:  Ambulation: 170 ft   POST:  Rate/Rhythm: 109 ST  BP:  Supine:   Sitting: 86/50, 92/59  Standing:    SaO2: 98%RA 1425-1445 Pt walked 170 ft on RA with rolling walker and minimal asst. Stopped twice to rest. Tolerated well. No c/o dizziness. To recliner after walk.    [name] Nuttingharlene Jerin Franzel, RN BSN  07/27/2015 2:41 PM

## 2015-07-28 LAB — BASIC METABOLIC PANEL
Anion gap: 10 (ref 5–15)
BUN: 25 mg/dL — ABNORMAL HIGH (ref 6–20)
CHLORIDE: 93 mmol/L — AB (ref 101–111)
CO2: 30 mmol/L (ref 22–32)
CREATININE: 2.12 mg/dL — AB (ref 0.44–1.00)
Calcium: 9.2 mg/dL (ref 8.9–10.3)
GFR calc non Af Amer: 22 mL/min — ABNORMAL LOW (ref >60)
GFR, EST AFRICAN AMERICAN: 26 mL/min — AB (ref >60)
GLUCOSE: 208 mg/dL — AB (ref 65–99)
Potassium: 4.7 mmol/L (ref 3.5–5.1)
Sodium: 133 mmol/L — ABNORMAL LOW (ref 135–145)

## 2015-07-28 LAB — GLUCOSE, CAPILLARY
GLUCOSE-CAPILLARY: 227 mg/dL — AB (ref 65–99)
GLUCOSE-CAPILLARY: 261 mg/dL — AB (ref 65–99)
Glucose-Capillary: 200 mg/dL — ABNORMAL HIGH (ref 65–99)
Glucose-Capillary: 157 mg/dL — ABNORMAL HIGH (ref 65–99)

## 2015-07-28 LAB — CBC
HCT: 31.3 % — ABNORMAL LOW (ref 36.0–46.0)
Hemoglobin: 9.8 g/dL — ABNORMAL LOW (ref 12.0–15.0)
MCH: 30.1 pg (ref 26.0–34.0)
MCHC: 31.3 g/dL (ref 30.0–36.0)
MCV: 96 fL (ref 78.0–100.0)
PLATELETS: 181 10*3/uL (ref 150–400)
RBC: 3.26 MIL/uL — AB (ref 3.87–5.11)
RDW: 15.5 % (ref 11.5–15.5)
WBC: 4.5 10*3/uL (ref 4.0–10.5)

## 2015-07-28 LAB — CARBOXYHEMOGLOBIN
Carboxyhemoglobin: 1.7 % — ABNORMAL HIGH (ref 0.5–1.5)
METHEMOGLOBIN: 1.1 % (ref 0.0–1.5)
O2 Saturation: 63.1 %
Total hemoglobin: 10 g/dL — ABNORMAL LOW (ref 12.0–16.0)

## 2015-07-28 MED ORDER — TORSEMIDE 20 MG PO TABS
60.0000 mg | ORAL_TABLET | Freq: Two times a day (BID) | ORAL | Status: DC
  Administered 2015-07-28 (×2): 60 mg via ORAL
  Filled 2015-07-28: qty 3

## 2015-07-28 NOTE — Progress Notes (Signed)
CARDIAC REHAB PHASE I   PRE:  Rate/Rhythm: 96 SR with PVC    BP: sitting 87/51    SaO2: 99 RA  MODE:  Ambulation: 170 ft   POST:  Rate/Rhythm: 105 ST    BP: sitting 91/60     SaO2: 100 RA  Pt seemed less SOB today, x1 rest. Still c/o abdominal "tiredness" toward end of walk. Return to recliner with feet elevated.  [id number]   Harriet MassonRandi Kristan Haille Pardi CES, ACSM 07/28/2015 10:06 AM

## 2015-07-28 NOTE — Progress Notes (Addendum)
Patient ID: [name] [name], female   DOB: 01/10/1941, 75 y.o.   MRN: [id number]   SUBJECTIVE:  Diuresis slowed yesterday.  CVP 8-9 today, co-ox 63%.  Walking with cardiac rehab, breathing better overall. No BMET yet.   Echo: EF 25-30% with multiple wall motion abnormalities, moderately dilated RV with moderately decreased RV systolic function, moderate MR, PASP 54 mmHg.   Abdominal MRI with complex right upper kidney cystic mass.   RHC Procedural Findings (07/21/15): Hemodynamics (mmHg) RA mean 17 RV 51/21 PA 57/32, mean 45 PCWP mean 27  Oxygen saturations: PA 62% AO 100%  Cardiac Output (Fick) 4.5  Cardiac Index (Fick) 2.3 PVR 4 WU      Scheduled Meds: . aspirin EC  81 mg Oral Daily  . atorvastatin  40 mg Oral q1800  . digoxin  0.0625 mg Oral Daily  . heparin  5,000 Units Subcutaneous Q8H  . insulin aspart  0-15 Units Subcutaneous TID WC  . insulin aspart  0-5 Units Subcutaneous QHS  . insulin aspart protamine- aspart  25 Units Subcutaneous BID WC  . isosorbide-hydrALAZINE  0.5 tablet Oral TID  . potassium chloride  40 mEq Oral BID  . sodium chloride flush  10-40 mL Intracatheter Q12H  . sodium chloride flush  3 mL Intravenous Q12H  . spironolactone  12.5 mg Oral Daily  . torsemide  60 mg Oral BID   Continuous Infusions: . milrinone 0.25 mcg/kg/min (07/28/15 0406)   PRN Meds:.sodium chloride, acetaminophen, ondansetron (ZOFRAN) IV, sodium chloride flush, sodium chloride flush   Filed Vitals:   07/27/15 1800 07/27/15 1953 07/27/15 2340 07/28/15 0343  BP:  82/45 82/47 91/52   Pulse:      Temp:  97.9 F (36.6 C) 98.4 F (36.9 C) 98.1 F (36.7 C)  TempSrc:  Oral Oral Oral  Resp: 19 20 21 22   Height:      Weight:    205 lb 7.5 oz (93.2 kg)  SpO2:  95% 100% 97%    Intake/Output Summary (Last 24 hours) at 07/28/15 0758 Last data filed at 07/28/15 0700  Gross per 24 hour  Intake 1810.8 ml  Output   2000 ml  Net -189.2 ml    LABS: Basic Metabolic  Panel:  Recent Labs  07/27/15 0431 07/27/15 1120  NA 135 135  K 4.4 4.2  CL 94* 95*  CO2 30 30  GLUCOSE 104* 77  BUN 29* 26*  CREATININE 1.93* 1.97*  CALCIUM 9.4 9.7   Liver Function Tests: No results for input(s): AST, ALT, ALKPHOS, BILITOT, PROT, ALBUMIN in the last 72 hours. No results for input(s): LIPASE, AMYLASE in the last 72 hours. CBC:  Recent Labs  07/27/15 0431 07/28/15 0405  WBC 4.5 4.5  HGB 9.4* 9.8*  HCT 30.5* 31.3*  MCV 94.7 96.0  PLT 169 181   Cardiac Enzymes: No results for input(s): CKTOTAL, CKMB, CKMBINDEX, TROPONINI in the last 72 hours. BNP: Invalid input(s): POCBNP D-Dimer: No results for input(s): DDIMER in the last 72 hours. Hemoglobin A1C: No results for input(s): HGBA1C in the last 72 hours. Fasting Lipid Panel: No results for input(s): CHOL, HDL, LDLCALC, TRIG, CHOLHDL, LDLDIRECT in the last 72 hours. Thyroid Function Tests: No results for input(s): TSH, T4TOTAL, T3FREE, THYROIDAB in the last 72 hours.  Invalid input(s): FREET3 Anemia Panel: No results for input(s): VITAMINB12, FOLATE, FERRITIN, TIBC, IRON, RETICCTPCT in the last 72 hours.  RADIOLOGY: Mr Abdomen Wo Contrast  07/26/2015  CLINICAL DATA:  75 year old female inpatient with indeterminate right  renal mass on renal ultrasound. EXAM: MRI ABDOMEN WITHOUT CONTRAST TECHNIQUE: Multiplanar multisequence MR imaging was performed without the administration of intravenous contrast. COMPARISON:  07/24/2015 renal sonogram . FINDINGS: Lower chest: Clear lung bases. Cardiomegaly. Susceptibility artifact from sternotomy wires. Hepatobiliary: Normal liver size and configuration. No hepatic steatosis. No liver mass. Focal adenomyomatosis in the fundus of the gallbladder. Otherwise normal gallbladder with no cholelithiasis. No biliary ductal dilatation. Common bile duct diameter 4 mm. No choledocholithiasis. Pancreas: No pancreatic mass or duct dilation.  No pancreas divisum. Spleen: Normal size.  No mass. Adrenals/Urinary Tract: Normal adrenals. No hydronephrosis. There is a complex 3.6 x 3.2 x 3.5 cm cystic renal mass in the lateral upper right kidney (series 13/ image 12), incompletely characterized on this noncontrast MRI, which demonstrates thickened internal septations, scattered internal subcentimeter nodular foci, and heterogeneous internal areas of restricted diffusion, suspicious for renal cell carcinoma. There are several similar-appearing homogeneous T1 hyperintense T2 hypointense renal cortical lesions in both kidneys, largest 1.1 cm in the posterior upper right kidney and 1.0 cm in the posterior upper left kidney, which are incompletely characterized on this noncontrast MRI, and are most suggestive of hemorrhagic/proteinaceous renal cysts. There is a simple appearing 0.9 cm renal cortical cyst in the mid to upper left kidney. Stomach/Bowel: Grossly normal stomach. Visualized small and large bowel is normal caliber, with no bowel wall thickening. Vascular/Lymphatic: Normal caliber abdominal aorta. No pathologically enlarged lymph nodes in the abdomen. Other: No abdominal ascites or focal fluid collection. Musculoskeletal: No aggressive appearing focal osseous lesions. IMPRESSION: 1. 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 Urology consultation advised. 2. Several small T1 hyperintense renal cortical lesions in both kidneys, incompletely characterized on this noncontrast MRI, most suggestive of hemorrhagic/proteinaceous renal cysts. Follow-up renal protocol MRI abdomen is recommended in 6 months to reassess these renal lesions, preferably without and with IV contrast if the patient's renal function has improved. 3. No noncontrast MRI evidence of metastatic disease in the abdomen. 4. Focal adenomyomatosis of the gallbladder fundus. Otherwise normal gallbladder, with no cholelithiasis. No biliary ductal  dilatation. 5. Cardiomegaly. Electronically Signed   By: Delbert Phenix M.D.   On: 07/26/2015 11:44   US Renal  07/24/2015  CLINICAL DATA:  Acute renal failure. EXAM: RENAL / URINARY TRACT ULTRASOUND COMPLETE COMPARISON:  None. FINDINGS: Right Kidney: Length: 10.2 cm. There is a 3.2 x 3.2 x 3 cm mass in the mid right kidney. This does is not demonstrate the appearance of a simple cyst and may contain solid components. No hydronephrosis. Cortical thinning is identified. Left Kidney: Length: 10.1 cm.  Cortical thinning with no hydronephrosis. Bladder: Appears normal for degree of bladder distention. IMPRESSION: 1. There is a mass in the right kidney which does not have the appearance of a simple cyst and may contain solid components. This raises the possibility of a renal neoplasm. This should be further assessed with a contrast-enhanced CT scan or MRI. MRI would be the most sensitive and specific exam. Electronically Signed   By: Gerome Sam III M.D   On: 07/24/2015 19:09   Dg Chest Port 1 View  07/22/2015  CLINICAL DATA:  Acute on chronic CHF, mitral and aortic insufficiency, coronary artery disease, former smoker, obesity. EXAM: PORTABLE CHEST 1 VIEW COMPARISON:  Chest x-ray of December 04, 2010 FINDINGS: The lungs are adequately inflated. There is no focal infiltrate and no pleural effusion. The cardiac silhouette is enlarged.  The pulmonary vascularity is engorged with mild cephalization observed. There are post CABG changes. There is calcification in the aortic arch. The PICC line tip projects over the midportion of the SVC. The observed bony thorax exhibits no acute abnormality. IMPRESSION: 1. CHF with mild pulmonary vascular congestion. There is no pneumonia. 2. Aortic atherosclerosis. Electronically Signed   By: David  SwazilandJordan M.D.   On: 07/22/2015 07:33    PHYSICAL EXAM General: NAD.  Neck: JVP 8-9 cm, no thyromegaly or thyroid nodule.  Lungs: Clear to auscultation bilaterally with normal  respiratory effort. CV: Lateral PMI.  Heart mildly tachy, regular S1/S2, no S3/S4, no murmur.  1+ ankle edema  Abdomen: Soft, nontender, no hepatosplenomegaly, no distention.  Neurologic: Alert and oriented x 3.  Psych: Normal affect. Extremities: No clubbing or cyanosis.   TELEMETRY: Reviewed telemetry NSR  ASSESSMENT AND PLAN: 75 yo with ischemic CMP and CAD s/p CABG admitted 6/22 after RHC with significant volume overload.  1. Acute on chronic systolic CHF: Ischemic cardiomyopathy. Echo with EF 25-30% and moderately decreased RV systolic function this admission. Medicine titration limited by low BP and renal dysfunction. She had RHC 6/22 => Cardiac output was relatively preserved but she was significantly volume overloaded.  Milrinone was started given renal dysfunction and need for considerable diuresis.  Today, co-ox 63% with CVP still about 8-9.  No BMET yet. - Transition to torsemide 60 mg po bid.   - Decrease milrinone to 0.125 mcg/kg/min.  - Continue Bidil 1/2 tablet tid and spiro 12.5. SBP in 90s-100s, if drops any lower will need to hold Bidil.  - Digoxin decreased to 0.0625 daily, check level again Friday.   - Continue Ted hose.  - No beta blocker or ACE/ARB for now.  - ECG with LBBB, QRS 158 msec. Will need to consider CRT-D when more stable.  2. CAD: s/p CABG. No chest pain [facility]. Given fall in EF, concerned for worsened CAD. Ideally, she would have coronary angiography.  However, given elevated creatinine and lack of chest pain, would hold off for now. Continue ASA 81 and atorvastatin.  3. CKD: Stage IV.Suspect CKD is major factor in limited diuresis.  Needs BMET today.  4. 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  Seen by urology, will need followup 8-10 weeks.   5. Work with PT/rehab.    Marca Anconaalton Tarah Buboltz MD 07/28/2015 7:58 AM

## 2015-07-28 NOTE — Consult Note (Signed)
   [facility] Consult   07/28/2015  Steva ColderDorothy C [name] 03/16/1940 [id number]  Patient is currently active, prior to admission,  with [facility] for chronic disease management services.  Patient has been engaged by a [employer] Community Care Coordinator.  Patient is a 75 yo with history of CAD s/p CABG, ischemic cardiomyopathy, and presented to HF clinic evaluation. Admitted with shortness of breath with marked overload.   Our community based plan of care has focused on disease management and community resource support.  Patient will receive a post discharge transition of care call and will be evaluated for monthly home visits for assessments and disease process education. Will continue to follow for post hospital disposition and care management needs.  Of note, [facility] services does not replace or interfere with any services that are needed or arranged by inpatient case management or social work.  For additional questions or referrals please contact:  Charlesetta ShanksVictoria Calyb Mcquarrie, RN BSN CCM [facility] Liaison  [phone] business mobile phone Toll free office [phone]

## 2015-07-29 LAB — CARBOXYHEMOGLOBIN
Carboxyhemoglobin: 1.4 % (ref 0.5–1.5)
Methemoglobin: 1.1 % (ref 0.0–1.5)
O2 Saturation: 61.2 %
Total hemoglobin: 13.6 g/dL (ref 12.0–16.0)

## 2015-07-29 LAB — BASIC METABOLIC PANEL
Anion gap: 11 (ref 5–15)
BUN: 30 mg/dL — AB (ref 6–20)
CHLORIDE: 94 mmol/L — AB (ref 101–111)
CO2: 30 mmol/L (ref 22–32)
CREATININE: 2.23 mg/dL — AB (ref 0.44–1.00)
Calcium: 9.2 mg/dL (ref 8.9–10.3)
GFR calc Af Amer: 24 mL/min — ABNORMAL LOW (ref >60)
GFR calc non Af Amer: 21 mL/min — ABNORMAL LOW (ref >60)
GLUCOSE: 161 mg/dL — AB (ref 65–99)
POTASSIUM: 4.9 mmol/L (ref 3.5–5.1)
Sodium: 135 mmol/L (ref 135–145)

## 2015-07-29 LAB — GLUCOSE, CAPILLARY
GLUCOSE-CAPILLARY: 151 mg/dL — AB (ref 65–99)
Glucose-Capillary: 184 mg/dL — ABNORMAL HIGH (ref 65–99)
Glucose-Capillary: 213 mg/dL — ABNORMAL HIGH (ref 65–99)
Glucose-Capillary: 178 mg/dL — ABNORMAL HIGH (ref 65–99)

## 2015-07-29 LAB — CBC
HEMATOCRIT: 30.8 % — AB (ref 36.0–46.0)
Hemoglobin: 9.6 g/dL — ABNORMAL LOW (ref 12.0–15.0)
MCH: 30.5 pg (ref 26.0–34.0)
MCHC: 31.2 g/dL (ref 30.0–36.0)
MCV: 97.8 fL (ref 78.0–100.0)
Platelets: 171 10*3/uL (ref 150–400)
RBC: 3.15 MIL/uL — ABNORMAL LOW (ref 3.87–5.11)
RDW: 15.8 % — AB (ref 11.5–15.5)
WBC: 4.8 10*3/uL (ref 4.0–10.5)

## 2015-07-29 LAB — DIGOXIN LEVEL: Digoxin Level: 1.1 ng/mL (ref 0.8–2.0)

## 2015-07-29 MED ORDER — DIGOXIN 125 MCG PO TABS
0.0625 mg | ORAL_TABLET | ORAL | Status: DC
  Administered 2015-07-30: 0.625 mg via ORAL
  Administered 2015-08-01 – 2015-08-03 (×2): 0.0625 mg via ORAL
  Filled 2015-07-29 (×4): qty 1

## 2015-07-29 MED ORDER — POTASSIUM CHLORIDE CRYS ER 20 MEQ PO TBCR
20.0000 meq | EXTENDED_RELEASE_TABLET | Freq: Every day | ORAL | Status: DC
  Administered 2015-07-29 – 2015-07-31 (×3): 20 meq via ORAL
  Filled 2015-07-29 (×3): qty 1

## 2015-07-29 MED ORDER — FUROSEMIDE 10 MG/ML IJ SOLN
160.0000 mg | Freq: Three times a day (TID) | INTRAVENOUS | Status: DC
  Administered 2015-07-29 – 2015-07-31 (×7): 160 mg via INTRAVENOUS
  Filled 2015-07-29 (×10): qty 16

## 2015-07-29 MED ORDER — MILRINONE LACTATE IN DEXTROSE 20-5 MG/100ML-% IV SOLN
0.1250 ug/kg/min | INTRAVENOUS | Status: DC
  Administered 2015-07-29 – 2015-07-30 (×3): 0.25 ug/kg/min via INTRAVENOUS
  Administered 2015-07-31 – 2015-08-01 (×2): 0.125 ug/kg/min via INTRAVENOUS
  Filled 2015-07-29 (×5): qty 100

## 2015-07-29 MED ORDER — METOLAZONE 5 MG PO TABS
5.0000 mg | ORAL_TABLET | Freq: Once | ORAL | Status: AC
  Administered 2015-07-29: 5 mg via ORAL
  Filled 2015-07-29: qty 1

## 2015-07-29 NOTE — Progress Notes (Signed)
Physical Therapy Treatment Patient Details Name: [name] [name] MRN: [id number] DOB: 02/15/1940 Today's Date: 07/29/2015    History of Present Illness Pt is a 75 y/o F who presented w/ dyspnea and significant LE edema.  Pt volume overloaded w/ acute on chronic systolic CHF.  Pt's PMH includes CABG, stage III CKD, DM II, sinus bradycardia, obesity.    PT Comments    [name] [name] made good progress today, ambulating 200 ft without AD.  She required multiple standing rest breaks due to fatigue and SOB.  Pt would benefit from OT evaluation.  Pt will benefit from continued skilled PT services to increase functional independence and safety.   Follow Up Recommendations  Home health PT;Supervision for mobility/OOB     Equipment Recommendations  None recommended by PT    Recommendations for Other Services OT consult     Precautions / Restrictions Precautions Precautions: Fall Precaution Comments: monitor O2 Restrictions Weight Bearing Restrictions: No    Mobility  Bed Mobility               General bed mobility comments: Pt sitting EOB upon PT arrival  Transfers Overall transfer level: Needs assistance Equipment used: None Transfers: Sit to/from Stand Sit to Stand: Min guard         General transfer comment: Min guard for safety.  Pt denies dizziness.  Ambulation/Gait Ambulation/Gait assistance: Supervision Ambulation Distance (Feet): 200 Feet Assistive device: None Gait Pattern/deviations: Step-through pattern;Decreased stride length   Gait velocity interpretation: Below normal speed for age/gender General Gait Details: 3 standing rest breaks due to SOB and fatigue.  Pt reaching out for rail and table to hold onto once back in room.   Stairs            Wheelchair Mobility    Modified Rankin (Stroke Patients Only)       Balance Overall balance assessment: Needs assistance Sitting-balance support: No upper extremity supported;Feet supported Sitting  balance-Leahy Scale: Good     Standing balance support: No upper extremity supported;During functional activity Standing balance-Leahy Scale: Fair                      Cognition Arousal/Alertness: Awake/alert Behavior During Therapy: WFL for tasks assessed/performed Overall Cognitive Status: Within Functional Limits for tasks assessed                      Exercises General Exercises - Lower Extremity Ankle Circles/Pumps: AROM;Both;15 reps;Seated Long Arc Quad: AROM;Both;10 reps;Seated Hip Flexion/Marching: Both;10 reps;Seated Other Exercises Other Exercises: Pt encouraged to ambulate 3x/day with nursing staff, pt verbalized multiple times that she will do this today.    General Comments General comments (skin integrity, edema, etc.): BP 90s/50s sitting at start of session but pt denies dizziness throughout entire session.      Pertinent Vitals/Pain Pain Assessment: No/denies pain    Home Living                      Prior Function            PT Goals (current goals can now be found in the care plan section) Acute Rehab PT Goals Patient Stated Goal: to feel better and go home PT Goal Formulation: With patient Time For Goal Achievement: 08/08/15 Potential to Achieve Goals: Good Progress towards PT goals: Progressing toward goals    Frequency  Min 3X/week    PT Plan Current plan remains appropriate    Co-evaluation  End of Session Equipment Utilized During Treatment: Gait belt Activity Tolerance: Patient tolerated treatment well;Patient limited by fatigue Patient left: in chair;with call bell/phone within reach     Time: 0942-1010 PT Time Calculation (min) (ACUTE ONLY): 28 min  Charges:  $Gait Training: 8-22 mins $Therapeutic Exercise: 8-22 mins                    G Codes:       Encarnacion ChuAshley Abashian PT, DPT  Pager: [phone] Phone: [phone] 07/29/2015, 12:37 PM

## 2015-07-29 NOTE — Progress Notes (Signed)
CARDIAC REHAB PHASE I   PRE:  Rate/Rhythm: 85 SR  BP:  Supine:   Sitting: 92/52  Standing:    SaO2: 94%RA  MODE:  Ambulation: 180 ft   POST:  Rate/Rhythm: 102 ST  BP:  Supine:   Sitting: 91/52  Standing:    SaO2: 97%RA 1445-1508 Pt walked 180 ft on RA with rolling walker and asst x 1 with steady gait. Stopped once to rest. To recliner after walk. Tolerated well.   Luetta Nuttingharlene Zita Ozimek, RN BSN  07/29/2015 3:05 PM

## 2015-07-29 NOTE — Progress Notes (Signed)
Patient ID: [name] [name], female   DOB: 04/04/1940, 75 y.o.   MRN: [id number]   SUBJECTIVE:  Patient transitioned to torsemide yesterday but weight up and CVP now 16.  Co-ox 61% on lower milrinone (0.125).  Walking with cardiac rehab, breathing better overall. Creatinine 2.2.   Echo: EF 25-30% with multiple wall motion abnormalities, moderately dilated RV with moderately decreased RV systolic function, moderate MR, PASP 54 mmHg.   Abdominal MRI with complex right upper kidney cystic mass.   RHC Procedural Findings (07/21/15): Hemodynamics (mmHg) RA mean 17 RV 51/21 PA 57/32, mean 45 PCWP mean 27  Oxygen saturations: PA 62% AO 100%  Cardiac Output (Fick) 4.5  Cardiac Index (Fick) 2.3 PVR 4 WU      Scheduled Meds: . aspirin EC  81 mg Oral Daily  . atorvastatin  40 mg Oral q1800  . [START ON 07/30/2015] digoxin  0.0625 mg Oral QODAY  . furosemide  160 mg Intravenous Q8H  . heparin  5,000 Units Subcutaneous Q8H  . insulin aspart  0-15 Units Subcutaneous TID WC  . insulin aspart  0-5 Units Subcutaneous QHS  . insulin aspart protamine- aspart  25 Units Subcutaneous BID WC  . isosorbide-hydrALAZINE  0.5 tablet Oral TID  . metolazone  5 mg Oral Once  . potassium chloride  20 mEq Oral Daily  . sodium chloride flush  10-40 mL Intracatheter Q12H  . sodium chloride flush  3 mL Intravenous Q12H  . spironolactone  12.5 mg Oral Daily   Continuous Infusions: . milrinone     PRN Meds:.sodium chloride, acetaminophen, ondansetron (ZOFRAN) IV, sodium chloride flush, sodium chloride flush   Filed Vitals:   07/28/15 2038 07/28/15 2319 07/28/15 2335 07/29/15 0500  BP: 86/50 92/60 86/56  93/55  Pulse: 87  88 84  Temp: 98.5 F (36.9 C)  98.6 F (37 C) 98.5 F (36.9 C)  TempSrc: Oral  Oral Oral  Resp:      Height:      Weight:    208 lb 12.4 oz (94.7 kg)  SpO2: 100%  97% 100%    Intake/Output Summary (Last 24 hours) at 07/29/15 0737 Last data filed at 07/29/15 0634  Gross per  24 hour  Intake 648.54 ml  Output   1500 ml  Net -851.46 ml    LABS: Basic Metabolic Panel:  Recent Labs  40/98/1106/29/17 0815 07/29/15 0445  NA 133* 135  K 4.7 4.9  CL 93* 94*  CO2 30 30  GLUCOSE 208* 161*  BUN 25* 30*  CREATININE 2.12* 2.23*  CALCIUM 9.2 9.2   Liver Function Tests: No results for input(s): AST, ALT, ALKPHOS, BILITOT, PROT, ALBUMIN in the last 72 hours. No results for input(s): LIPASE, AMYLASE in the last 72 hours. CBC:  Recent Labs  07/27/15 0431 07/28/15 0405  WBC 4.5 4.5  HGB 9.4* 9.8*  HCT 30.5* 31.3*  MCV 94.7 96.0  PLT 169 181   Cardiac Enzymes: No results for input(s): CKTOTAL, CKMB, CKMBINDEX, TROPONINI in the last 72 hours. BNP: Invalid input(s): POCBNP D-Dimer: No results for input(s): DDIMER in the last 72 hours. Hemoglobin A1C: No results for input(s): HGBA1C in the last 72 hours. Fasting Lipid Panel: No results for input(s): CHOL, HDL, LDLCALC, TRIG, CHOLHDL, LDLDIRECT in the last 72 hours. Thyroid Function Tests: No results for input(s): TSH, T4TOTAL, T3FREE, THYROIDAB in the last 72 hours.  Invalid input(s): FREET3 Anemia Panel: No results for input(s): VITAMINB12, FOLATE, FERRITIN, TIBC, IRON, RETICCTPCT in the last 72 hours.  RADIOLOGY: Mr Abdomen Wo Contrast  07/26/2015  CLINICAL DATA:  75 year old female inpatient with indeterminate right renal mass on renal ultrasound. EXAM: MRI ABDOMEN WITHOUT CONTRAST TECHNIQUE: Multiplanar multisequence MR imaging was performed without the administration of intravenous contrast. COMPARISON:  07/24/2015 renal sonogram . FINDINGS: Lower chest: Clear lung bases. Cardiomegaly. Susceptibility artifact from sternotomy wires. Hepatobiliary: Normal liver size and configuration. No hepatic steatosis. No liver mass. Focal adenomyomatosis in the fundus of the gallbladder. Otherwise normal gallbladder with no cholelithiasis. No biliary ductal dilatation. Common bile duct diameter 4 mm. No  choledocholithiasis. Pancreas: No pancreatic mass or duct dilation.  No pancreas divisum. Spleen: Normal size. No mass. Adrenals/Urinary Tract: Normal adrenals. No hydronephrosis. There is a complex 3.6 x 3.2 x 3.5 cm cystic renal mass in the lateral upper right kidney (series 13/ image 12), incompletely characterized on this noncontrast MRI, which demonstrates thickened internal septations, scattered internal subcentimeter nodular foci, and heterogeneous internal areas of restricted diffusion, suspicious for renal cell carcinoma. There are several similar-appearing homogeneous T1 hyperintense T2 hypointense renal cortical lesions in both kidneys, largest 1.1 cm in the posterior upper right kidney and 1.0 cm in the posterior upper left kidney, which are incompletely characterized on this noncontrast MRI, and are most suggestive of hemorrhagic/proteinaceous renal cysts. There is a simple appearing 0.9 cm renal cortical cyst in the mid to upper left kidney. Stomach/Bowel: Grossly normal stomach. Visualized small and large bowel is normal caliber, with no bowel wall thickening. Vascular/Lymphatic: Normal caliber abdominal aorta. No pathologically enlarged lymph nodes in the abdomen. Other: No abdominal ascites or focal fluid collection. Musculoskeletal: No aggressive appearing focal osseous lesions. IMPRESSION: 1. 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 Urology consultation advised. 2. Several small T1 hyperintense renal cortical lesions in both kidneys, incompletely characterized on this noncontrast MRI, most suggestive of hemorrhagic/proteinaceous renal cysts. Follow-up renal protocol MRI abdomen is recommended in 6 months to reassess these renal lesions, preferably without and with IV contrast if the patient's renal function has improved. 3. No noncontrast MRI evidence of metastatic disease in the abdomen. 4. Focal  adenomyomatosis of the gallbladder fundus. Otherwise normal gallbladder, with no cholelithiasis. No biliary ductal dilatation. 5. Cardiomegaly. Electronically Signed   By: Delbert PhenixJason A Poff M.D.   On: 07/26/2015 11:44   [place] Renal  07/24/2015  CLINICAL DATA:  Acute renal failure. EXAM: RENAL / URINARY TRACT ULTRASOUND COMPLETE COMPARISON:  None. FINDINGS: Right Kidney: Length: 10.2 cm. There is a 3.2 x 3.2 x 3 cm mass in the mid right kidney. This does is not demonstrate the appearance of a simple cyst and may contain solid components. No hydronephrosis. Cortical thinning is identified. Left Kidney: Length: 10.1 cm.  Cortical thinning with no hydronephrosis. Bladder: Appears normal for degree of bladder distention. IMPRESSION: 1. There is a mass in the right kidney which does not have the appearance of a simple cyst and may contain solid components. This raises the possibility of a renal neoplasm. This should be further assessed with a contrast-enhanced CT scan or MRI. MRI would be the most sensitive and specific exam. Electronically Signed   By: Gerome Samavid  Williams III M.D   On: 07/24/2015 19:09   Dg Chest Port 1 View  07/22/2015  CLINICAL DATA:  Acute on chronic CHF, mitral and aortic insufficiency, coronary artery disease, former smoker, obesity. EXAM: PORTABLE CHEST 1 VIEW COMPARISON:  Chest x-ray of December 04, 2010 FINDINGS: The lungs  are adequately inflated. There is no focal infiltrate and no pleural effusion. The cardiac silhouette is enlarged. The pulmonary vascularity is engorged with mild cephalization observed. There are post CABG changes. There is calcification in the aortic arch. The PICC line tip projects over the midportion of the SVC. The observed bony thorax exhibits no acute abnormality. IMPRESSION: 1. CHF with mild pulmonary vascular congestion. There is no pneumonia. 2. Aortic atherosclerosis. Electronically Signed   By: David  [place] M.D.   On: 07/22/2015 07:33    PHYSICAL EXAM General: NAD.    Neck: JVP 12-14 cm, no thyromegaly or thyroid nodule.  Lungs: Clear to auscultation bilaterally with normal respiratory effort. CV: Lateral PMI.  Heart mildly tachy, regular S1/S2, no S3/S4, no murmur.  Trace ankle edema  Abdomen: Soft, nontender, no hepatosplenomegaly, no distention.  Neurologic: Alert and oriented x 3.  Psych: Normal affect. Extremities: No clubbing or cyanosis.   TELEMETRY: Reviewed telemetry NSR  ASSESSMENT AND PLAN: 75 yo with ischemic CMP and CAD s/p CABG admitted 6/22 after RHC with significant volume overload.  1. Acute on chronic systolic CHF: Ischemic cardiomyopathy. Echo with EF 25-30% and moderately decreased RV systolic function this admission. Medicine titration limited by low BP and renal dysfunction. She had RHC 6/22 => Cardiac output was relatively preserved but she was significantly volume overloaded.  Milrinone was started given renal dysfunction and need for considerable diuresis, decreased to 0.125 yesterday and now on torsemide.  Today, co-ox 61% with CVP up to 16.  She looks volume overloaded on exam with elevated JVP.    - I am going to put her back on Lasix 160 mg IV tid today with a dose of metolazone 5 mg po x 1.   - Increase milrinone back to 0.25 mcg/kg/min.  - Continue Bidil 1/2 tablet tid and spiro 12.5. SBP in 90s-100s, if drops any lower will need to hold Bidil.  - Decrease digoxin to every other day with level 1.1.    - Continue Ted hose.  - No beta blocker or ACE/ARB for now.  - ECG with LBBB, QRS 158 msec. Will need to consider CRT-D when more stable.  2. CAD: s/p CABG. No chest pain since prior to admission at [facility] in 5/17. Given fall in EF, concerned for worsened CAD. Ideally, she would have coronary angiography.  However, given elevated creatinine and lack of chest pain, would hold off for now. Continue ASA 81 and atorvastatin.  3. CKD: Stage IV.Suspect CKD is major factor in limited diuresis.   4. 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  Seen by urology, will need followup 8-10 weeks.   5. Work with PT/rehab.    Marca Ancona MD 07/29/2015 7:37 AM

## 2015-07-30 LAB — GLUCOSE, CAPILLARY
GLUCOSE-CAPILLARY: 118 mg/dL — AB (ref 65–99)
GLUCOSE-CAPILLARY: 255 mg/dL — AB (ref 65–99)
GLUCOSE-CAPILLARY: 160 mg/dL — AB (ref 65–99)
Glucose-Capillary: 208 mg/dL — ABNORMAL HIGH (ref 65–99)

## 2015-07-30 LAB — CARBOXYHEMOGLOBIN
Carboxyhemoglobin: 2 % — ABNORMAL HIGH (ref 0.5–1.5)
Methemoglobin: 0.8 % (ref 0.0–1.5)
O2 Saturation: 55.7 %
TOTAL HEMOGLOBIN: 10 g/dL — AB (ref 12.0–16.0)

## 2015-07-30 LAB — BASIC METABOLIC PANEL
Anion gap: 9 (ref 5–15)
BUN: 29 mg/dL — AB (ref 6–20)
CO2: 31 mmol/L (ref 22–32)
CREATININE: 2.09 mg/dL — AB (ref 0.44–1.00)
Calcium: 9.1 mg/dL (ref 8.9–10.3)
Chloride: 93 mmol/L — ABNORMAL LOW (ref 101–111)
GFR, EST AFRICAN AMERICAN: 26 mL/min — AB (ref >60)
GFR, EST NON AFRICAN AMERICAN: 23 mL/min — AB (ref >60)
Glucose, Bld: 125 mg/dL — ABNORMAL HIGH (ref 65–99)
POTASSIUM: 3.8 mmol/L (ref 3.5–5.1)
SODIUM: 133 mmol/L — AB (ref 135–145)

## 2015-07-30 MED ORDER — METOLAZONE 5 MG PO TABS
5.0000 mg | ORAL_TABLET | Freq: Two times a day (BID) | ORAL | Status: DC
  Administered 2015-07-30 – 2015-07-31 (×4): 5 mg via ORAL
  Filled 2015-07-30 (×4): qty 1

## 2015-07-30 NOTE — Progress Notes (Signed)
Patient ID: [name] [name], female   DOB: 10/15/1940, 75 y.o.   MRN: [id number]   SUBJECTIVE:  CVP 15 today with co-ox 56% on milrinone 0.25.  Weight down ?5 lbs, down 17 lbs total.  Doing well with her walks with cardiac rehab. Creatinine down to 2.09 today.   Echo: EF 25-30% with multiple wall motion abnormalities, moderately dilated RV with moderately decreased RV systolic function, moderate MR, PASP 54 mmHg.   Abdominal MRI with complex right upper kidney cystic mass.   RHC Procedural Findings (07/21/15): Hemodynamics (mmHg) RA mean 17 RV 51/21 PA 57/32, mean 45 PCWP mean 27  Oxygen saturations: PA 62% AO 100%  Cardiac Output (Fick) 4.5  Cardiac Index (Fick) 2.3 PVR 4 WU      Scheduled Meds: . aspirin EC  81 mg Oral Daily  . atorvastatin  40 mg Oral q1800  . digoxin  0.0625 mg Oral QODAY  . furosemide  160 mg Intravenous Q8H  . heparin  5,000 Units Subcutaneous Q8H  . insulin aspart  0-15 Units Subcutaneous TID WC  . insulin aspart  0-5 Units Subcutaneous QHS  . insulin aspart protamine- aspart  25 Units Subcutaneous BID WC  . isosorbide-hydrALAZINE  0.5 tablet Oral TID  . metolazone  5 mg Oral BID  . potassium chloride  20 mEq Oral Daily  . sodium chloride flush  10-40 mL Intracatheter Q12H  . sodium chloride flush  3 mL Intravenous Q12H  . spironolactone  12.5 mg Oral Daily   Continuous Infusions: . milrinone 0.25 mcg/kg/min (07/29/15 2042)   PRN Meds:.sodium chloride, acetaminophen, ondansetron (ZOFRAN) IV, sodium chloride flush, sodium chloride flush   Filed Vitals:   07/30/15 0355 07/30/15 0400 07/30/15 0800 07/30/15 0807  BP: 83/57  104/64   Pulse: 88 95 92 92  Temp: 98.8 F (37.1 C)     TempSrc: Oral     Resp: 18     Height:      Weight: 203 lb 6.4 oz (92.262 kg)     SpO2: 99% 94% 98% 95%    Intake/Output Summary (Last 24 hours) at 07/30/15 0817 Last data filed at 07/30/15 0405  Gross per 24 hour  Intake 764.78 ml  Output   2000 ml  Net  -1235.22 ml    LABS: Basic Metabolic Panel:  Recent Labs  11/91/4706/30/17 0445 07/30/15 0425  NA 135 133*  K 4.9 3.8  CL 94* 93*  CO2 30 31  GLUCOSE 161* 125*  BUN 30* 29*  CREATININE 2.23* 2.09*  CALCIUM 9.2 9.1   Liver Function Tests: No results for input(s): AST, ALT, ALKPHOS, BILITOT, PROT, ALBUMIN in the last 72 hours. No results for input(s): LIPASE, AMYLASE in the last 72 hours. CBC:  Recent Labs  07/28/15 0405 07/29/15 0445  WBC 4.5 4.8  HGB 9.8* 9.6*  HCT 31.3* 30.8*  MCV 96.0 97.8  PLT 181 171   Cardiac Enzymes: No results for input(s): CKTOTAL, CKMB, CKMBINDEX, TROPONINI in the last 72 hours. BNP: Invalid input(s): POCBNP D-Dimer: No results for input(s): DDIMER in the last 72 hours. Hemoglobin A1C: No results for input(s): HGBA1C in the last 72 hours. Fasting Lipid Panel: No results for input(s): CHOL, HDL, LDLCALC, TRIG, CHOLHDL, LDLDIRECT in the last 72 hours. Thyroid Function Tests: No results for input(s): TSH, T4TOTAL, T3FREE, THYROIDAB in the last 72 hours.  Invalid input(s): FREET3 Anemia Panel: No results for input(s): VITAMINB12, FOLATE, FERRITIN, TIBC, IRON, RETICCTPCT in the last 72 hours.  RADIOLOGY: Mr Abdomen  Wo Contrast  07/26/2015  CLINICAL DATA:  75 year old female inpatient with indeterminate right renal mass on renal ultrasound. EXAM: MRI ABDOMEN WITHOUT CONTRAST TECHNIQUE: Multiplanar multisequence MR imaging was performed without the administration of intravenous contrast. COMPARISON:  07/24/2015 renal sonogram . FINDINGS: Lower chest: Clear lung bases. Cardiomegaly. Susceptibility artifact from sternotomy wires. Hepatobiliary: Normal liver size and configuration. No hepatic steatosis. No liver mass. Focal adenomyomatosis in the fundus of the gallbladder. Otherwise normal gallbladder with no cholelithiasis. No biliary ductal dilatation. Common bile duct diameter 4 mm. No choledocholithiasis. Pancreas: No pancreatic mass or duct dilation.   No pancreas divisum. Spleen: Normal size. No mass. Adrenals/Urinary Tract: Normal adrenals. No hydronephrosis. There is a complex 3.6 x 3.2 x 3.5 cm cystic renal mass in the lateral upper right kidney (series 13/ image 12), incompletely characterized on this noncontrast MRI, which demonstrates thickened internal septations, scattered internal subcentimeter nodular foci, and heterogeneous internal areas of restricted diffusion, suspicious for renal cell carcinoma. There are several similar-appearing homogeneous T1 hyperintense T2 hypointense renal cortical lesions in both kidneys, largest 1.1 cm in the posterior upper right kidney and 1.0 cm in the posterior upper left kidney, which are incompletely characterized on this noncontrast MRI, and are most suggestive of hemorrhagic/proteinaceous renal cysts. There is a simple appearing 0.9 cm renal cortical cyst in the mid to upper left kidney. Stomach/Bowel: Grossly normal stomach. Visualized small and large bowel is normal caliber, with no bowel wall thickening. Vascular/Lymphatic: Normal caliber abdominal aorta. No pathologically enlarged lymph nodes in the abdomen. Other: No abdominal ascites or focal fluid collection. Musculoskeletal: No aggressive appearing focal osseous lesions. IMPRESSION: 1. 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 Urology consultation advised. 2. Several small T1 hyperintense renal cortical lesions in both kidneys, incompletely characterized on this noncontrast MRI, most suggestive of hemorrhagic/proteinaceous renal cysts. Follow-up renal protocol MRI abdomen is recommended in 6 months to reassess these renal lesions, preferably without and with IV contrast if the patient's renal function has improved. 3. No noncontrast MRI evidence of metastatic disease in the abdomen. 4. Focal adenomyomatosis of the gallbladder fundus. Otherwise normal gallbladder,  with no cholelithiasis. No biliary ductal dilatation. 5. Cardiomegaly. Electronically Signed   By: Delbert Phenix M.D.   On: 07/26/2015 11:44   US Renal  07/24/2015  CLINICAL DATA:  Acute renal failure. EXAM: RENAL / URINARY TRACT ULTRASOUND COMPLETE COMPARISON:  None. FINDINGS: Right Kidney: Length: 10.2 cm. There is a 3.2 x 3.2 x 3 cm mass in the mid right kidney. This does is not demonstrate the appearance of a simple cyst and may contain solid components. No hydronephrosis. Cortical thinning is identified. Left Kidney: Length: 10.1 cm.  Cortical thinning with no hydronephrosis. Bladder: Appears normal for degree of bladder distention. IMPRESSION: 1. There is a mass in the right kidney which does not have the appearance of a simple cyst and may contain solid components. This raises the possibility of a renal neoplasm. This should be further assessed with a contrast-enhanced CT scan or MRI. MRI would be the most sensitive and specific exam. Electronically Signed   By: Gerome Sam III M.D   On: 07/24/2015 19:09   Dg Chest Port 1 View  07/22/2015  CLINICAL DATA:  Acute on chronic CHF, mitral and aortic insufficiency, coronary artery disease, former smoker, obesity. EXAM: PORTABLE CHEST 1 VIEW COMPARISON:  Chest x-ray of December 04, 2010 FINDINGS: The lungs are adequately inflated.  There is no focal infiltrate and no pleural effusion. The cardiac silhouette is enlarged. The pulmonary vascularity is engorged with mild cephalization observed. There are post CABG changes. There is calcification in the aortic arch. The PICC line tip projects over the midportion of the SVC. The observed bony thorax exhibits no acute abnormality. IMPRESSION: 1. CHF with mild pulmonary vascular congestion. There is no pneumonia. 2. Aortic atherosclerosis. Electronically Signed   By: David  [place]aziland[place] M.D.   On: 07/22/2015 07:33    PHYSICAL EXAM General: NAD.  Neck: JVP 12-14 cm, no thyromegaly or thyroid nodule.  Lungs: Clear to  auscultation bilaterally with normal respiratory effort. CV: Lateral PMI.  Heart mildly tachy, regular S1/S2, no S3/S4, no murmur.  1+ ankle edema  Abdomen: Soft, nontender, no hepatosplenomegaly, no distention.  Neurologic: Alert and oriented x 3.  Psych: Normal affect. Extremities: No clubbing or cyanosis.   TELEMETRY: Reviewed telemetry NSR  ASSESSMENT AND PLAN: 75 yo with ischemic CMP and CAD s/p CABG admitted 6/22 after RHC with significant volume overload.  1. Acute on chronic systolic CHF: Ischemic cardiomyopathy. Echo with EF 25-30% and moderately decreased RV systolic function this admission. Medicine titration limited by low BP and renal dysfunction. She had RHC 6/22 => Cardiac output was relatively preserved but she was significantly volume overloaded.  Milrinone was started given renal dysfunction and need for considerable diuresis.  Today's co-ox 56% with CVP still 15.  Weight down about 17 lbs so far but still with volume overload by exam and CVP.    - Continue Lasix 160 mg IV tid today, will give metolazone 5 mg po bid.    - Continue milrinone 0.25 mcg/kg/min.  I hope to be able to wean her off this when diuresed but concerned that RV dysfunction and renal dysfunction could make this difficult and will make her prone to re-accumulate fluid quickly.  - Continue Bidil 1/2 tablet tid and spiro 12.5. SBP in 90s-100s, if drops any lower will need to hold Bidil.  - Continue qod digoxin for now, repeat level in am.    - Continue Ted hose.  - No beta blocker or ACE/ARB for now.  - ECG with LBBB, QRS 158 msec. Will need to consider CRT-D when more stable. Looking long-term, LVAD placement would be difficult given RV and renal dysfunction.  2. CAD: s/p CABG. [facility] chest [facility]. Given fall in EF, concerned for worsened CAD. Ideally, she would have coronary angiography.  However, given elevated creatinine and lack of chest pain, would hold off for  now. Continue ASA 81 and atorvastatin.  3. CKD: Stage IV.Suspect CKD is major factor in limited diuresis.  Creatinine lower today at 2.09, follow closely.  4. 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  Seen by urology, will need followup 8-10 weeks.   5. Work with PT/rehab.  Suspect she will be here over weekend, would like to try to get her home by early next week.   Marca Anconaalton Linzie Boursiquot MD 07/30/2015 8:17 AM

## 2015-07-30 NOTE — Progress Notes (Signed)
CARDIAC REHAB PHASE I   PRE:  Rate/Rhythm: 96 SR    BP: sitting 88/61    SaO2: 97 RA  MODE:  Ambulation: 260 ft   POST:  Rate/Rhythm: 108 ST    BP: sitting 97/64     SaO2: 99 RA  Moving well, just gets SOB. Increased distance today (by accident but did about the same with her breathing). Return to recliner. Encouraged more walking over the weekend. [id number]   [name] MassonRandi Kristan Kaelani Kendrick CES, ACSM 07/30/2015 11:50 AM

## 2015-07-31 DIAGNOSIS — R570 Cardiogenic shock: Secondary | ICD-10-CM

## 2015-07-31 LAB — CARBOXYHEMOGLOBIN
Carboxyhemoglobin: 1.6 % — ABNORMAL HIGH (ref 0.5–1.5)
Methemoglobin: 0.9 % (ref 0.0–1.5)
O2 SAT: 72.4 %
TOTAL HEMOGLOBIN: 9.8 g/dL — AB (ref 12.0–16.0)

## 2015-07-31 LAB — BASIC METABOLIC PANEL
Anion gap: 11 (ref 5–15)
BUN: 31 mg/dL — AB (ref 6–20)
CALCIUM: 9 mg/dL (ref 8.9–10.3)
CHLORIDE: 90 mmol/L — AB (ref 101–111)
CO2: 30 mmol/L (ref 22–32)
CREATININE: 2.06 mg/dL — AB (ref 0.44–1.00)
GFR, EST AFRICAN AMERICAN: 27 mL/min — AB (ref >60)
GFR, EST NON AFRICAN AMERICAN: 23 mL/min — AB (ref >60)
Glucose, Bld: 191 mg/dL — ABNORMAL HIGH (ref 65–99)
Potassium: 3.5 mmol/L (ref 3.5–5.1)
SODIUM: 131 mmol/L — AB (ref 135–145)

## 2015-07-31 LAB — CBC
HEMATOCRIT: 29.8 % — AB (ref 36.0–46.0)
Hemoglobin: 9.5 g/dL — ABNORMAL LOW (ref 12.0–15.0)
MCH: 30.2 pg (ref 26.0–34.0)
MCHC: 31.9 g/dL (ref 30.0–36.0)
MCV: 94.6 fL (ref 78.0–100.0)
PLATELETS: 174 10*3/uL (ref 150–400)
RBC: 3.15 MIL/uL — ABNORMAL LOW (ref 3.87–5.11)
RDW: 15.7 % — AB (ref 11.5–15.5)
WBC: 4.3 10*3/uL (ref 4.0–10.5)

## 2015-07-31 LAB — GLUCOSE, CAPILLARY
GLUCOSE-CAPILLARY: 164 mg/dL — AB (ref 65–99)
GLUCOSE-CAPILLARY: 261 mg/dL — AB (ref 65–99)
Glucose-Capillary: 256 mg/dL — ABNORMAL HIGH (ref 65–99)
Glucose-Capillary: 113 mg/dL — ABNORMAL HIGH (ref 65–99)

## 2015-07-31 LAB — DIGOXIN LEVEL: Digoxin Level: 0.7 ng/mL — ABNORMAL LOW (ref 0.8–2.0)

## 2015-07-31 MED ORDER — POTASSIUM CHLORIDE CRYS ER 20 MEQ PO TBCR
20.0000 meq | EXTENDED_RELEASE_TABLET | Freq: Once | ORAL | Status: AC
  Administered 2015-07-31: 20 meq via ORAL
  Filled 2015-07-31: qty 1

## 2015-07-31 MED ORDER — TORSEMIDE 20 MG PO TABS
80.0000 mg | ORAL_TABLET | Freq: Two times a day (BID) | ORAL | Status: DC
  Administered 2015-07-31 – 2015-08-03 (×6): 80 mg via ORAL
  Filled 2015-07-31 (×6): qty 4

## 2015-07-31 MED ORDER — TORSEMIDE 20 MG PO TABS
80.0000 mg | ORAL_TABLET | Freq: Once | ORAL | Status: AC
  Administered 2015-07-31: 80 mg via ORAL
  Filled 2015-07-31: qty 4

## 2015-07-31 NOTE — Progress Notes (Signed)
Patient ID: [name] [name], female   DOB: 11/11/1940, 75 y.o.   MRN: [id number]   SUBJECTIVE:  Denies SOB. Weight down another 3 pounds (20 pounds total) with high-dose IV lasix and metolazone. Co-ox 72% on milrinone 0.25.  Walking with CR. Creatinine stable at 2.1. CVP 15-> 7  Dig level 0.7   Echo: EF 25-30% with multiple wall motion abnormalities, moderately dilated RV with moderately decreased RV systolic function, moderate MR, PASP 54 mmHg.   Abdominal MRI with complex right upper kidney cystic mass.   RHC Procedural Findings (07/21/15): Hemodynamics (mmHg) RA mean 17 RV 51/21 PA 57/32, mean 45 PCWP mean 27  Oxygen saturations: PA 62% AO 100%  Cardiac Output (Fick) 4.5  Cardiac Index (Fick) 2.3 PVR 4 WU      Scheduled Meds: . aspirin EC  81 mg Oral Daily  . atorvastatin  40 mg Oral q1800  . digoxin  0.0625 mg Oral QODAY  . furosemide  160 mg Intravenous Q8H  . heparin  5,000 Units Subcutaneous Q8H  . insulin aspart  0-15 Units Subcutaneous TID WC  . insulin aspart  0-5 Units Subcutaneous QHS  . insulin aspart protamine- aspart  25 Units Subcutaneous BID WC  . isosorbide-hydrALAZINE  0.5 tablet Oral TID  . metolazone  5 mg Oral BID  . potassium chloride  20 mEq Oral Daily  . sodium chloride flush  10-40 mL Intracatheter Q12H  . sodium chloride flush  3 mL Intravenous Q12H  . spironolactone  12.5 mg Oral Daily   Continuous Infusions: . milrinone 0.25 mcg/kg/min (07/31/15 0600)   PRN Meds:.sodium chloride, acetaminophen, ondansetron (ZOFRAN) IV, sodium chloride flush, sodium chloride flush   Filed Vitals:   07/31/15 0514 07/31/15 0731 07/31/15 1004 07/31/15 1140  BP: 90/48 100/57 93/53   Pulse:      Temp:  97.7 F (36.5 C)  98.4 F (36.9 C)  TempSrc:  Oral  Oral  Resp:  18    Height:      Weight: 91 kg (200 lb 9.9 oz)     SpO2:  98% 98%     Intake/Output Summary (Last 24 hours) at 07/31/15 1241 Last data filed at 07/31/15 1000  Gross per 24 hour    Intake 512.08 ml  Output   2050 ml  Net -1537.92 ml    LABS: Basic Metabolic Panel:  Recent Labs  62/13/08 0425 07/31/15 0410  NA 133* 131*  K 3.8 3.5  CL 93* 90*  CO2 31 30  GLUCOSE 125* 191*  BUN 29* 31*  CREATININE 2.09* 2.06*  CALCIUM 9.1 9.0   Liver Function Tests: No results for input(s): AST, ALT, ALKPHOS, BILITOT, PROT, ALBUMIN in the last 72 hours. No results for input(s): LIPASE, AMYLASE in the last 72 hours. CBC:  Recent Labs  07/29/15 0445 07/31/15 0410  WBC 4.8 4.3  HGB 9.6* 9.5*  HCT 30.8* 29.8*  MCV 97.8 94.6  PLT 171 174   Cardiac Enzymes: No results for input(s): CKTOTAL, CKMB, CKMBINDEX, TROPONINI in the last 72 hours. BNP: Invalid input(s): POCBNP D-Dimer: No results for input(s): DDIMER in the last 72 hours. Hemoglobin A1C: No results for input(s): HGBA1C in the last 72 hours. Fasting Lipid Panel: No results for input(s): CHOL, HDL, LDLCALC, TRIG, CHOLHDL, LDLDIRECT in the last 72 hours. Thyroid Function Tests: No results for input(s): TSH, T4TOTAL, T3FREE, THYROIDAB in the last 72 hours.  Invalid input(s): FREET3 Anemia Panel: No results for input(s): VITAMINB12, FOLATE, FERRITIN, TIBC, IRON, RETICCTPCT in  the last 72 hours.  RADIOLOGY: Mr Abdomen Wo Contrast  07/26/2015  CLINICAL DATA:  75 year old female inpatient with indeterminate right renal mass on renal ultrasound. EXAM: MRI ABDOMEN WITHOUT CONTRAST TECHNIQUE: Multiplanar multisequence MR imaging was performed without the administration of intravenous contrast. COMPARISON:  07/24/2015 renal sonogram . FINDINGS: Lower chest: Clear lung bases. Cardiomegaly. Susceptibility artifact from sternotomy wires. Hepatobiliary: Normal liver size and configuration. No hepatic steatosis. No liver mass. Focal adenomyomatosis in the fundus of the gallbladder. Otherwise normal gallbladder with no cholelithiasis. No biliary ductal dilatation. Common bile duct diameter 4 mm. No choledocholithiasis.  Pancreas: No pancreatic mass or duct dilation.  No pancreas divisum. Spleen: Normal size. No mass. Adrenals/Urinary Tract: Normal adrenals. No hydronephrosis. There is a complex 3.6 x 3.2 x 3.5 cm cystic renal mass in the lateral upper right kidney (series 13/ image 12), incompletely characterized on this noncontrast MRI, which demonstrates thickened internal septations, scattered internal subcentimeter nodular foci, and heterogeneous internal areas of restricted diffusion, suspicious for renal cell carcinoma. There are several similar-appearing homogeneous T1 hyperintense T2 hypointense renal cortical lesions in both kidneys, largest 1.1 cm in the posterior upper right kidney and 1.0 cm in the posterior upper left kidney, which are incompletely characterized on this noncontrast MRI, and are most suggestive of hemorrhagic/proteinaceous renal cysts. There is a simple appearing 0.9 cm renal cortical cyst in the mid to upper left kidney. Stomach/Bowel: Grossly normal stomach. Visualized small and large bowel is normal caliber, with no bowel wall thickening. Vascular/Lymphatic: Normal caliber abdominal aorta. No pathologically enlarged lymph nodes in the abdomen. Other: No abdominal ascites or focal fluid collection. Musculoskeletal: No aggressive appearing focal osseous lesions. IMPRESSION: 1. 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 Urology consultation advised. 2. Several small T1 hyperintense renal cortical lesions in both kidneys, incompletely characterized on this noncontrast MRI, most suggestive of hemorrhagic/proteinaceous renal cysts. Follow-up renal protocol MRI abdomen is recommended in 6 months to reassess these renal lesions, preferably without and with IV contrast if the patient's renal function has improved. 3. No noncontrast MRI evidence of metastatic disease in the abdomen. 4. Focal adenomyomatosis of the  gallbladder fundus. Otherwise normal gallbladder, with no cholelithiasis. No biliary ductal dilatation. 5. Cardiomegaly. Electronically Signed   By: Delbert PhenixJason A Poff M.D.   On: 07/26/2015 11:44   [place] Renal  07/24/2015  CLINICAL DATA:  Acute renal failure. EXAM: RENAL / URINARY TRACT ULTRASOUND COMPLETE COMPARISON:  None. FINDINGS: Right Kidney: Length: 10.2 cm. There is a 3.2 x 3.2 x 3 cm mass in the mid right kidney. This does is not demonstrate the appearance of a simple cyst and may contain solid components. No hydronephrosis. Cortical thinning is identified. Left Kidney: Length: 10.1 cm.  Cortical thinning with no hydronephrosis. Bladder: Appears normal for degree of bladder distention. IMPRESSION: 1. There is a mass in the right kidney which does not have the appearance of a simple cyst and may contain solid components. This raises the possibility of a renal neoplasm. This should be further assessed with a contrast-enhanced CT scan or MRI. MRI would be the most sensitive and specific exam. Electronically Signed   By: Gerome Samavid  Williams III M.D   On: 07/24/2015 19:09   Dg Chest Port 1 View  07/22/2015  CLINICAL DATA:  Acute on chronic CHF, mitral and aortic insufficiency, coronary artery disease, former smoker, obesity. EXAM: PORTABLE CHEST 1 VIEW COMPARISON:  Chest x-ray of December 04, 2010 FINDINGS: The lungs are adequately inflated. There is no focal infiltrate and no pleural effusion. The cardiac silhouette is enlarged. The pulmonary vascularity is engorged with mild cephalization observed. There are post CABG changes. There is calcification in the aortic arch. The PICC line tip projects over the midportion of the SVC. The observed bony thorax exhibits no acute abnormality. IMPRESSION: 1. CHF with mild pulmonary vascular congestion. There is no pneumonia. 2. Aortic atherosclerosis. Electronically Signed   By: David  SwazilandJordan M.D.   On: 07/22/2015 07:33    PHYSICAL EXAM General: NAD. Sitting in chair Neck:  JVP 7-8 cm, no thyromegaly or thyroid nodule.  Lungs: Clear to auscultation bilaterally with normal respiratory effort. CV: Lateral PMI.  Heart mildly tachy, regular S1/S2, no S3/S4, no murmur.  No edema Abdomen: Soft, nontender, no hepatosplenomegaly, no distention.  Neurologic: Alert and oriented x 3.  Psych: Normal affect. Extremities: No clubbing or cyanosis.   TELEMETRY: Reviewed telemetry NSR  ASSESSMENT AND PLAN: 75 yo with ischemic CMP and CAD s/p CABG admitted 6/22 after RHC with significant volume overload.  1. Acute on chronic systolic CHF: Ischemic cardiomyopathy. Echo with EF 25-30% and moderately decreased RV systolic function this admission. Medicine titration limited by low BP and renal dysfunction. She had RHC 6/22 => Cardiac output was relatively preserved but she was significantly volume overloaded.  Milrinone was started given renal dysfunction and need for considerable diuresis.  Today's co-ox 72% with CVP down to 7-8. Weight down about 20 lbs - volume status much improved will switch to torsemide 80 po bid and follow volume status. - Decrease milrinone to 0.125 mcg/kg/min. Hopefully we canwean her off this when diuresed but concerned that RV dysfunction and renal dysfunction could make this difficult and will make her prone to re-accumulate fluid quickly.  - Continue Bidil 1/2 tablet tid and spiro 12.5. BP soft but stable.  - Continue qod digoxin for now. Level ok today 0.7. Would repeat in 1 week.  - Continue Ted hose.  - No beta blocker or ACE/ARB for now.  - ECG with LBBB, QRS 158 msec. Can consider CRT-D when more stable but given advanced HF doubt this will be sufficient. Looking long-term, LVAD placement would be difficult given RV and renal dysfunction.  2. CAD: s/p CABG. [facility] chest pain [facility]. Given fall in EF, concerned for worsened CAD. Ideally, she would have coronary angiography.  However, given elevated creatinine and  lack of chest pain, would hold off for now. Continue ASA 81 and atorvastatin.  3. CKD: Stage IV.Suspect CKD is major factor in limited diuresis.  Creatinine stable today at 2.1. 4. 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  Seen by urology, will need followup 8-10 weeks.   5. Work with PT/rehab.  Suspect she will be here over weekend, would like to try to get her home by early this week.  6. Hypokalemia: will supp  Arvilla MeresBensimhon, Daniel MD 07/31/2015 12:41 PM

## 2015-08-01 LAB — GLUCOSE, CAPILLARY
GLUCOSE-CAPILLARY: 147 mg/dL — AB (ref 65–99)
GLUCOSE-CAPILLARY: 232 mg/dL — AB (ref 65–99)
Glucose-Capillary: 232 mg/dL — ABNORMAL HIGH (ref 65–99)
Glucose-Capillary: 272 mg/dL — ABNORMAL HIGH (ref 65–99)

## 2015-08-01 LAB — CARBOXYHEMOGLOBIN
Carboxyhemoglobin: 1.5 % (ref 0.5–1.5)
Carboxyhemoglobin: 1.8 % — ABNORMAL HIGH (ref 0.5–1.5)
METHEMOGLOBIN: 1 % (ref 0.0–1.5)
Methemoglobin: 0.6 % (ref 0.0–1.5)
O2 SAT: 58.1 %
O2 Saturation: 54.6 %
TOTAL HEMOGLOBIN: 9.5 g/dL — AB (ref 12.0–16.0)
TOTAL HEMOGLOBIN: 10.4 g/dL — AB (ref 12.0–16.0)

## 2015-08-01 LAB — BASIC METABOLIC PANEL
Anion gap: 21 — ABNORMAL HIGH (ref 5–15)
BUN: 32 mg/dL — ABNORMAL HIGH (ref 6–20)
CHLORIDE: 85 mmol/L — AB (ref 101–111)
CO2: 29 mmol/L (ref 22–32)
Calcium: 10.1 mg/dL (ref 8.9–10.3)
Creatinine, Ser: 2.12 mg/dL — ABNORMAL HIGH (ref 0.44–1.00)
GFR calc non Af Amer: 22 mL/min — ABNORMAL LOW (ref >60)
GFR, EST AFRICAN AMERICAN: 26 mL/min — AB (ref >60)
Glucose, Bld: 159 mg/dL — ABNORMAL HIGH (ref 65–99)
POTASSIUM: 3.6 mmol/L (ref 3.5–5.1)
SODIUM: 135 mmol/L (ref 135–145)

## 2015-08-01 MED ORDER — POTASSIUM CHLORIDE CRYS ER 20 MEQ PO TBCR
40.0000 meq | EXTENDED_RELEASE_TABLET | Freq: Every day | ORAL | Status: DC
  Administered 2015-08-01 – 2015-08-03 (×3): 40 meq via ORAL
  Filled 2015-08-01 (×3): qty 2

## 2015-08-01 NOTE — Progress Notes (Signed)
Physical Therapy Treatment Patient Details Name: [name] [name] MRN: [id number] DOB: 01/23/1941 Today's Date: 08/01/2015    History of Present Illness Pt is a 75 y/o F who presented w/ dyspnea and significant LE edema.  Pt volume overloaded w/ acute on chronic systolic CHF.  Pt's PMH includes CABG, stage III CKD, DM II, sinus bradycardia, obesity.    PT Comments    Patient fatigued this session due to multiple therapies today including cardiac rehab and OT prior to this session.  Feel, though still progressing to participate more than once per day.  Feel HHPT appropriate for d/c home with family support.   Follow Up Recommendations  Home health PT;Supervision for mobility/OOB     Equipment Recommendations  None recommended by PT    Recommendations for Other Services       Precautions / Restrictions Precautions Precautions: Fall Precaution Comments: monitor O2    Mobility  Bed Mobility Overal bed mobility: Needs Assistance Bed Mobility: Sit to Supine       Sit to supine: Min guard   General bed mobility comments: assist for guiding legs into bed  Transfers Overall transfer level: Needs assistance Equipment used: Rolling walker (2 wheeled) Transfers: Sit to/from Stand Sit to Stand: Min assist Stand pivot transfers: Min guard       General transfer comment: up from recliner with cues for hand placement  Ambulation/Gait Ambulation/Gait assistance: Supervision;Min guard Ambulation Distance (Feet): 150 Feet Assistive device: Rolling walker (2 wheeled) Gait Pattern/deviations: Step-through pattern;Decreased stride length     General Gait Details: occasional assist for turning walker   Stairs            Wheelchair Mobility    Modified Rankin (Stroke Patients Only)       Balance Overall balance assessment: Needs assistance Sitting-balance support: Feet supported Sitting balance-Leahy Scale: Good     Standing balance support: Single extremity  supported;During functional activity Standing balance-Leahy Scale: Fair Standing balance comment: transferred onto bed without walker with minguard                    Cognition Arousal/Alertness: Awake/alert Behavior During Therapy: WFL for tasks assessed/performed Overall Cognitive Status: Within Functional Limits for tasks assessed                      Exercises      General Comments General comments (skin integrity, edema, etc.): VSS during activity      Pertinent Vitals/Pain Pain Assessment: No/denies pain    Home Living Family/patient expects to be discharged to:: Private residence Living Arrangements: Spouse/significant other Available Help at Discharge: Family;Available 24 hours/day Type of Home: House Home Access: Stairs to enter Entrance Stairs-Rails: None Home Layout: One level Home Equipment: Bedside commode;Cane - single point;Walker - 4 wheels;Shower seat;Hand held shower head;Wheelchair - manual      Prior Function Level of Independence: Needs assistance  Gait / Transfers Assistance Needed: Ambulates w/ cane and RW depending on which one is closer to her at the time.   ADL's / Homemaking Assistance Needed: Recently pt has needed some assist w/ washing her back and getting dressed due to SOB.  Before onset of symtpoms pt was able to do this without assist.     PT Goals (current goals can now be found in the care plan section) Acute Rehab PT Goals Patient Stated Goal: to get better and go home  Progress towards PT goals: Progressing toward goals    Frequency  Min  3X/week    PT Plan Current plan remains appropriate    Co-evaluation             End of Session Equipment Utilized During Treatment: Gait belt Activity Tolerance: Patient limited by fatigue Patient left: in bed;with call bell/phone within reach     Time: 1540-1557 PT Time Calculation (min) (ACUTE ONLY): 17 min  Charges:  $Gait Training: 8-22 mins                     G Codes:      [name] [name] 08/01/2015, 5:05 PM [name] [name], PT [phone] 08/01/2015

## 2015-08-01 NOTE — Progress Notes (Signed)
CARDIAC REHAB PHASE I   PRE:  Rate/Rhythm: 81SR  BP:  Supine:   Sitting: 91/53  Standing:    SaO2: 98%RA  MODE:  Ambulation: 200 ft   POST:  Rate/Rhythm: 96SR  BP:  Supine:   Sitting: 93/59  Standing:    SaO2: 99%RA 0935-1015 Pt walked 200 ft on RA with rolling walker and asst x 1 with steady gait. Tolerated well. Stopped once to rest. To recliner after walk. Gave pt CHF booklet and reviewed yellow zones of when to call MD. Pt stated she has scale that communicates information with staff. Knew to call MD with three pound weight gain over night but had to be reminded of 5 pounds in week and 2000 mg sodium restriction and 2L FR. Gave pt magnet of zones to put on refrigerator and gave low sodium diets. Encouraged not to eat canned foods.  Will continue to see.   Luetta Nuttingharlene Nkosi Cortright, RN BSN  08/01/2015 10:12 AM

## 2015-08-01 NOTE — Evaluation (Signed)
Occupational Therapy Evaluation Patient Details Name: [name] [name] MRN: [id number] DOB: 04/19/1940 Today's Date: 08/01/2015    History of Present Illness Pt is a 75 y/o F who presented w/ dyspnea and significant LE edema.  Pt volume overloaded w/ acute on chronic systolic CHF.  Pt's PMH includes CABG, stage III CKD, DM II, sinus bradycardia, obesity.   Clinical Impression   Pt admitted with above. She demonstrates the below listed deficits and will benefit from continued OT to maximize safety and independence with BADLs.  Pt presents to OT with generalized weakness.  She requires min A for ADLs with DOE 2/4 - 3/4.  VSS throughout eval.  She has good family support and is very motivated.  Will follow acutely.       Follow Up Recommendations  Home health OT;Supervision/Assistance - 24 hour    Equipment Recommendations       Recommendations for Other Services       Precautions / Restrictions Precautions Precautions: Fall Precaution Comments: monitor O2      Mobility Bed Mobility                  Transfers Overall transfer level: Needs assistance Equipment used: Rolling walker (2 wheeled) Transfers: Sit to/from Stand;Stand Pivot Transfers Sit to Stand: Min guard Stand pivot transfers: Min guard       General transfer comment: min guard for safety     Balance Overall balance assessment: Needs assistance Sitting-balance support: Feet supported Sitting balance-Leahy Scale: Good     Standing balance support: Single extremity supported;During functional activity Standing balance-Leahy Scale: Fair                              ADL Overall ADL's : Needs assistance/impaired Eating/Feeding: Independent   Grooming: Wash/dry hands;Wash/dry face;Oral care;Brushing hair;Set up;Sitting   Upper Body Bathing: Set up;Sitting   Lower Body Bathing: Minimal assistance;Sit to/from stand Lower Body Bathing Details (indicate cue type and reason): requires  assist for posterior peri care and for feet  Upper Body Dressing : Set up;Sitting   Lower Body Dressing: Minimal assistance;Sit to/from stand Lower Body Dressing Details (indicate cue type and reason): Pt with SOB while donning socks.  She reports she typically sits EOB and props LEs sideways on bed to access feet  Toilet Transfer: Min guard;Ambulation;Comfort height toilet;Regular Toilet;Grab bars;RW Toilet Transfer Details (indicate cue type and reason): Pt reports difficulty controlling descent when sitting on commode at home. Instructed her how to use 3in1 over commode to raise seat height and provide arms to push on when moving sit to stand  Toileting- Clothing Manipulation and Hygiene: Minimal assistance;Sit to/from stand Toileting - Clothing Manipulation Details (indicate cue type and reason): min A to access posterior per area      Functional mobility during ADLs: Min guard;Rolling walker General ADL Comments: Pt with DOE 6/5-7/82/4-3/4.  VSS during ADLs      Vision     Perception     Praxis      Pertinent Vitals/Pain Pain Assessment: No/denies pain     Hand Dominance     Extremity/Trunk Assessment Upper Extremity Assessment Upper Extremity Assessment: Generalized weakness   Lower Extremity Assessment Lower Extremity Assessment: Defer to PT evaluation   Cervical / Trunk Assessment Cervical / Trunk Assessment: Normal   Communication Communication Communication: No difficulties   Cognition Arousal/Alertness: Awake/alert Behavior During Therapy: WFL for tasks assessed/performed Overall Cognitive Status: Within Functional Limits for tasks  assessed                     General Comments       Exercises       Shoulder Instructions      Home Living Family/patient expects to be discharged to:: Private residence Living Arrangements: Spouse/significant other Available Help at Discharge: Family;Available 24 hours/day Type of Home: House Home Access: Stairs to  enter [employer] CorporationEntrance Stairs-Number of Steps: 1 Entrance Stairs-Rails: None Home Layout: One level     Bathroom Shower/Tub: Tub/shower unit Shower/tub characteristics: Engineer, building servicesCurtain Bathroom Toilet: Standard     Home Equipment: Bedside commode;Cane - single point;Walker - 4 wheels;Shower seat;Hand held shower head;Wheelchair - manual          Prior Functioning/Environment Level of Independence: Needs assistance  Gait / Transfers Assistance Needed: Ambulates w/ cane and RW depending on which one is closer to her at the time.   ADL's / Homemaking Assistance Needed: Recently pt has needed some assist w/ washing her back and getting dressed due to SOB.  Before onset of symtpoms pt was able to do this without assist.        OT Diagnosis: Generalized weakness   OT Problem List: Decreased strength;Decreased activity tolerance;Impaired balance (sitting and/or standing);Decreased safety awareness;Decreased knowledge of use of DME or AE;Cardiopulmonary status limiting activity   OT Treatment/Interventions: Self-care/ADL training;Therapeutic exercise;DME and/or AE instruction;Therapeutic activities;Energy conservation;Patient/family education    OT Goals(Current goals can be found in the care plan section) Acute Rehab OT Goals Patient Stated Goal: to get better and go home  OT Goal Formulation: With patient Time For Goal Achievement: 08/15/15 Potential to Achieve Goals: Good ADL Goals Pt Will Perform Grooming: with modified independence;standing Pt Will Perform Upper Body Bathing: with modified independence;sitting Pt Will Perform Lower Body Bathing: with modified independence;sit to/from stand Pt Will Perform Upper Body Dressing: with modified independence;sitting Pt Will Perform Lower Body Dressing: with modified independence;sit to/from stand Pt Will Transfer to Toilet: with modified independence;ambulating;regular height toilet;bedside commode;grab bars Pt Will Perform Toileting - Clothing  Manipulation and hygiene: with modified independence;sit to/from stand Pt Will Perform Tub/Shower Transfer: Tub transfer;with supervision;ambulating;shower seat;rolling walker  OT Frequency: Min 2X/week   Barriers to D/C:            Co-evaluation              End of Session Equipment Utilized During Treatment: Rolling walker Nurse Communication: Mobility status  Activity Tolerance: Patient tolerated treatment well Patient left: in chair;with call bell/phone within reach   Time: 1324-1414 OT Time Calculation (min): 50 min Charges:  OT General Charges $OT Visit: 1 Procedure OT Evaluation $OT Eval Moderate Complexity: 1 Procedure OT Treatments $Self Care/Home Management : 23-37 mins G-Codes:    Christal Lagerstrom M 08/01/2015, 3:01 PM

## 2015-08-01 NOTE — Progress Notes (Signed)
Patient ID: [name] [name], female   DOB: 08/08/1940, 75 y.o.   MRN: [id number]   SUBJECTIVE:  Denies SOB or CP. No lightheadedness or dizziness. Walking more with CR.  Willing to go home on milrinone if needed.   Weight stable.  Out 1 L yesterday.  Down 20 pounds total. Co-ox 58% this am on milrinone 0.125. Creatinine stable at 2.1.   CVP 7-8  Dig level 0.7   Echo: EF 25-30% with multiple wall motion abnormalities, moderately dilated RV with moderately decreased RV systolic function, moderate MR, PASP 54 mmHg.   Abdominal MRI 07/26/15 with complex right upper kidney cystic mass.   RHC Procedural Findings (07/21/15): Hemodynamics (mmHg) RA mean 17 RV 51/21 PA 57/32, mean 45 PCWP mean 27  Oxygen saturations: PA 62% AO 100%  Cardiac Output (Fick) 4.5  Cardiac Index (Fick) 2.3 PVR 4 WU      Scheduled Meds: . aspirin EC  81 mg Oral Daily  . atorvastatin  40 mg Oral q1800  . digoxin  0.0625 mg Oral QODAY  . heparin  5,000 Units Subcutaneous Q8H  . insulin aspart  0-15 Units Subcutaneous TID WC  . insulin aspart  0-5 Units Subcutaneous QHS  . insulin aspart protamine- aspart  25 Units Subcutaneous BID WC  . isosorbide-hydrALAZINE  0.5 tablet Oral TID  . potassium chloride  40 mEq Oral Daily  . sodium chloride flush  10-40 mL Intracatheter Q12H  . sodium chloride flush  3 mL Intravenous Q12H  . spironolactone  12.5 mg Oral Daily  . torsemide  80 mg Oral BID   Continuous Infusions: . milrinone 0.125 mcg/kg/min (07/31/15 2000)   PRN Meds:.sodium chloride, acetaminophen, ondansetron (ZOFRAN) IV, sodium chloride flush, sodium chloride flush   Filed Vitals:   08/01/15 0400 08/01/15 0500 08/01/15 0503 08/01/15 0726  BP: 82/48  91/61 97/59  Pulse: 90  89 86  Temp: 98.2 F (36.8 C)   97.9 F (36.6 C)  TempSrc: Oral   Oral  Resp:    20  Height:      Weight:  200 lb 2.8 oz (90.8 kg)    SpO2: 96%  97% 95%    Intake/Output Summary (Last 24 hours) at 08/01/15 0743 Last  data filed at 08/01/15 0600  Gross per 24 hour  Intake 114.17 ml  Output   1400 ml  Net -1285.83 ml    LABS: Basic Metabolic Panel:  Recent Labs  40/98/1105/03/17 0410 08/01/15 0446  NA 131* 135  K 3.5 3.6  CL 90* 85*  CO2 30 29  GLUCOSE 191* 159*  BUN 31* 32*  CREATININE 2.06* 2.12*  CALCIUM 9.0 10.1   Liver Function Tests: No results for input(s): AST, ALT, ALKPHOS, BILITOT, PROT, ALBUMIN in the last 72 hours. No results for input(s): LIPASE, AMYLASE in the last 72 hours. CBC:  Recent Labs  07/31/15 0410  WBC 4.3  HGB 9.5*  HCT 29.8*  MCV 94.6  PLT 174   Cardiac Enzymes: No results for input(s): CKTOTAL, CKMB, CKMBINDEX, TROPONINI in the last 72 hours. BNP: Invalid input(s): POCBNP D-Dimer: No results for input(s): DDIMER in the last 72 hours. Hemoglobin A1C: No results for input(s): HGBA1C in the last 72 hours. Fasting Lipid Panel: No results for input(s): CHOL, HDL, LDLCALC, TRIG, CHOLHDL, LDLDIRECT in the last 72 hours. Thyroid Function Tests: No results for input(s): TSH, T4TOTAL, T3FREE, THYROIDAB in the last 72 hours.  Invalid input(s): FREET3 Anemia Panel: No results for input(s): VITAMINB12, FOLATE, FERRITIN, TIBC,  IRON, RETICCTPCT in the last 72 hours.  RADIOLOGY: Mr Abdomen Wo Contrast  07/26/2015  CLINICAL DATA:  75 year old female inpatient with indeterminate right renal mass on renal ultrasound. EXAM: MRI ABDOMEN WITHOUT CONTRAST TECHNIQUE: Multiplanar multisequence MR imaging was performed without the administration of intravenous contrast. COMPARISON:  07/24/2015 renal sonogram . FINDINGS: Lower chest: Clear lung bases. Cardiomegaly. Susceptibility artifact from sternotomy wires. Hepatobiliary: Normal liver size and configuration. No hepatic steatosis. No liver mass. Focal adenomyomatosis in the fundus of the gallbladder. Otherwise normal gallbladder with no cholelithiasis. No biliary ductal dilatation. Common bile duct diameter 4 mm. No  choledocholithiasis. Pancreas: No pancreatic mass or duct dilation.  No pancreas divisum. Spleen: Normal size. No mass. Adrenals/Urinary Tract: Normal adrenals. No hydronephrosis. There is a complex 3.6 x 3.2 x 3.5 cm cystic renal mass in the lateral upper right kidney (series 13/ image 12), incompletely characterized on this noncontrast MRI, which demonstrates thickened internal septations, scattered internal subcentimeter nodular foci, and heterogeneous internal areas of restricted diffusion, suspicious for renal cell carcinoma. There are several similar-appearing homogeneous T1 hyperintense T2 hypointense renal cortical lesions in both kidneys, largest 1.1 cm in the posterior upper right kidney and 1.0 cm in the posterior upper left kidney, which are incompletely characterized on this noncontrast MRI, and are most suggestive of hemorrhagic/proteinaceous renal cysts. There is a simple appearing 0.9 cm renal cortical cyst in the mid to upper left kidney. Stomach/Bowel: Grossly normal stomach. Visualized small and large bowel is normal caliber, with no bowel wall thickening. Vascular/Lymphatic: Normal caliber abdominal aorta. No pathologically enlarged lymph nodes in the abdomen. Other: No abdominal ascites or focal fluid collection. Musculoskeletal: No aggressive appearing focal osseous lesions. IMPRESSION: 1. 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 Urology consultation advised. 2. Several small T1 hyperintense renal cortical lesions in both kidneys, incompletely characterized on this noncontrast MRI, most suggestive of hemorrhagic/proteinaceous renal cysts. Follow-up renal protocol MRI abdomen is recommended in 6 months to reassess these renal lesions, preferably without and with IV contrast if the patient's renal function has improved. 3. No noncontrast MRI evidence of metastatic disease in the abdomen. 4. Focal  adenomyomatosis of the gallbladder fundus. Otherwise normal gallbladder, with no cholelithiasis. No biliary ductal dilatation. 5. Cardiomegaly. Electronically Signed   By: Delbert Phenix M.D.   On: 07/26/2015 11:44   US Renal  07/24/2015  CLINICAL DATA:  Acute renal failure. EXAM: RENAL / URINARY TRACT ULTRASOUND COMPLETE COMPARISON:  None. FINDINGS: Right Kidney: Length: 10.2 cm. There is a 3.2 x 3.2 x 3 cm mass in the mid right kidney. This does is not demonstrate the appearance of a simple cyst and may contain solid components. No hydronephrosis. Cortical thinning is identified. Left Kidney: Length: 10.1 cm.  Cortical thinning with no hydronephrosis. Bladder: Appears normal for degree of bladder distention. IMPRESSION: 1. There is a mass in the right kidney which does not have the appearance of a simple cyst and may contain solid components. This raises the possibility of a renal neoplasm. This should be further assessed with a contrast-enhanced CT scan or MRI. MRI would be the most sensitive and specific exam. Electronically Signed   By: Gerome Sam III M.D   On: 07/24/2015 19:09   Dg Chest Port 1 View  07/22/2015  CLINICAL DATA:  Acute on chronic CHF, mitral and aortic insufficiency, coronary artery disease, former smoker, obesity. EXAM: PORTABLE CHEST 1 VIEW COMPARISON:  Chest  x-ray of December 04, 2010 FINDINGS: The lungs are adequately inflated. There is no focal infiltrate and no pleural effusion. The cardiac silhouette is enlarged. The pulmonary vascularity is engorged with mild cephalization observed. There are post CABG changes. There is calcification in the aortic arch. The PICC line tip projects over the midportion of the SVC. The observed bony thorax exhibits no acute abnormality. IMPRESSION: 1. CHF with mild pulmonary vascular congestion. There is no pneumonia. 2. Aortic atherosclerosis. Electronically Signed   By: David  [place] M.D.   On: 07/22/2015 07:33    PHYSICAL EXAM CVP ~8 General:  NAD. Sitting in chair Neck: JVP ~8 cm, no thyromegaly or thyroid nodule.  Lungs: Clear to auscultation bilaterally with normal respiratory effort. CV: Lateral PMI.  Heart mildly tachy, regular S1/S2, no S3/S4, no murmur.  No edema Abdomen: Soft, nontender, no hepatosplenomegaly, no distention.  Neurologic: Alert and oriented x 3.  Psych: Normal affect. Extremities: No clubbing or cyanosis.   TELEMETRY: Reviewed personally, NSR 80-90s  ASSESSMENT AND PLAN: 75 yo with ischemic CMP and CAD s/p CABG admitted 6/22 after RHC with significant volume overload.  1. Acute on chronic systolic CHF: Ischemic cardiomyopathy. Echo with EF 25-30% and moderately decreased RV systolic function this admission. Medicine titration limited by low BP and renal dysfunction. She had RHC 6/22 => Cardiac output was relatively preserved but she was significantly volume overloaded.  Milrinone was started given renal dysfunction and need for considerable diuresis.  Co-ox marginal 58.1% with CVP down to 7-8. Weight down about 20 lbs - Volume status stable.  Continue torsemide 80 po bid. - Continue milrinone 0.125 mcg/kg/min. Will recheck coox this afternoon. Hope to wean her off this slowly. Concerned that RV dysfunction and renal dysfunction could make this difficult and will make her prone to re-accumulate fluid quickly.  - Continue Bidil 1/2 tablet tid and spiro 12.5. BP soft but stable in 90s.  - Continue qod digoxin for now. Level 07/31/15 was 0.7. Would repeat in 1 week (08/07/15) - Continue Ted hose.  - No beta blocker or ACE/ARB for now.  - ECG with LBBB, QRS 158 msec. Can consider CRT-D when more stable but given advanced HF doubt this will be sufficient. Looking long-term, LVAD placement would be difficult given RV and renal dysfunction.  2. CAD: s/p CABG. - No chest pain since prior to admission at [facility] in 5/17. Given fall in EF, concerned for worsened CAD. Ideally, she would have coronary angiography.   However, given elevated creatinine and lack of chest pain, would hold off for now. Continue ASA 81 and atorvastatin.  3. CKD: Stage IV. - Suspect CKD is major factor in limited diuresis.  Creatinine stable today at 2.1. 4. 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  Seen by urology, will need followup 8-10 weeks.  5. Deconditioning  - Work with PT/rehab.   - Likely 1-2 more days.  6. Hypokalemia: - Continue supp  Graciella Freer PA-C 08/01/2015 7:43 AM  Advanced Heart Failure Team Pager [phone] (M-F; 7a - 4p)  Please contact [facility] for night-coverage after hours (4p -7a ) and weekends on [email or web address]    Patient seen and examined with Otilio Saber, PA-C. We discussed all aspects of the encounter. I agree with the assessment and plan as stated above.   Volume status stable on oral diuretics but co-ox still borderline. Will keep milrinone at 0.125 today and recheck co-ox in am. If > 55% will try  to wean milrinone to off. Renal function stable. Continue PT.  Lorene Samaan,MD 6:53 PM

## 2015-08-02 LAB — BASIC METABOLIC PANEL
ANION GAP: 10 (ref 5–15)
BUN: 36 mg/dL — ABNORMAL HIGH (ref 6–20)
CALCIUM: 8.8 mg/dL — AB (ref 8.9–10.3)
CO2: 28 mmol/L (ref 22–32)
Chloride: 94 mmol/L — ABNORMAL LOW (ref 101–111)
Creatinine, Ser: 2.2 mg/dL — ABNORMAL HIGH (ref 0.44–1.00)
GFR, EST AFRICAN AMERICAN: 25 mL/min — AB (ref >60)
GFR, EST NON AFRICAN AMERICAN: 21 mL/min — AB (ref >60)
Glucose, Bld: 123 mg/dL — ABNORMAL HIGH (ref 65–99)
POTASSIUM: 3.5 mmol/L (ref 3.5–5.1)
Sodium: 132 mmol/L — ABNORMAL LOW (ref 135–145)

## 2015-08-02 LAB — GLUCOSE, CAPILLARY
GLUCOSE-CAPILLARY: 179 mg/dL — AB (ref 65–99)
GLUCOSE-CAPILLARY: 190 mg/dL — AB (ref 65–99)
GLUCOSE-CAPILLARY: 137 mg/dL — AB (ref 65–99)
Glucose-Capillary: 131 mg/dL — ABNORMAL HIGH (ref 65–99)

## 2015-08-02 LAB — CARBOXYHEMOGLOBIN
CARBOXYHEMOGLOBIN: 1.8 % — AB (ref 0.5–1.5)
METHEMOGLOBIN: 0.6 % (ref 0.0–1.5)
O2 SAT: 57.6 %
TOTAL HEMOGLOBIN: 8.9 g/dL — AB (ref 12.0–16.0)

## 2015-08-02 NOTE — Progress Notes (Signed)
Patient started feeling SOB at 19:57 soon after ambulating back to the bed from the bathroom, O2 sat on room air was 100% and lungs clear in all fields. 2L [place] applied for patient comfort and patient reported no SOB soon after. At 21:15 patient reported sharp 3/10 pain on right side of chest radiating to her back. An EKG was done and MD paged. 650mg  Acetaminophen was given and 1 hour later she reports no pain. Will continue to monitor.

## 2015-08-02 NOTE — Progress Notes (Addendum)
Patient has not voided for 6 hours. Bladder scan done, zero mL volume scanned in bladder. Will continue to monitor.

## 2015-08-02 NOTE — Progress Notes (Signed)
Patient ID: [name] [name], female   DOB: 12/19/1940, 75 y.o.   MRN: [id number]   SUBJECTIVE:  Sitting in chair. Feels good. No SOB. Walking more with CR - went 200 feet yesterday.     Weight stable at 200#. Down 20 pounds total. Co-ox 58% this am on milrinone 0.125. Creatinine stable at 2.1-2.2.   CVP up to 11-12  Dig level 0.7   Echo: EF 25-30% with multiple wall motion abnormalities, moderately dilated RV with moderately decreased RV systolic function, moderate MR, PASP 54 mmHg.   Abdominal MRI 07/26/15 with complex right upper kidney cystic mass.   RHC Procedural Findings (07/21/15): Hemodynamics (mmHg) RA mean 17 RV 51/21 PA 57/32, mean 45 PCWP mean 27  Oxygen saturations: PA 62% AO 100%  Cardiac Output (Fick) 4.5  Cardiac Index (Fick) 2.3 PVR 4 WU      Scheduled Meds: . aspirin EC  81 mg Oral Daily  . atorvastatin  40 mg Oral q1800  . digoxin  0.0625 mg Oral QODAY  . heparin  5,000 Units Subcutaneous Q8H  . insulin aspart  0-15 Units Subcutaneous TID WC  . insulin aspart  0-5 Units Subcutaneous QHS  . insulin aspart protamine- aspart  25 Units Subcutaneous BID WC  . isosorbide-hydrALAZINE  0.5 tablet Oral TID  . potassium chloride  40 mEq Oral Daily  . sodium chloride flush  10-40 mL Intracatheter Q12H  . sodium chloride flush  3 mL Intravenous Q12H  . spironolactone  12.5 mg Oral Daily  . torsemide  80 mg Oral BID   Continuous Infusions: . milrinone 0.125 mcg/kg/min (08/01/15 2000)   PRN Meds:.sodium chloride, acetaminophen, ondansetron (ZOFRAN) IV, sodium chloride flush, sodium chloride flush   Filed Vitals:   08/02/15 0346 08/02/15 0400 08/02/15 0750 08/02/15 0800  BP: 89/48  84/71 93/60  Pulse: 82 86 83 90  Temp: 98.2 F (36.8 C)   98.2 F (36.8 C)  TempSrc: Oral   Oral  Resp: 16   16  Height:      Weight: 91.1 kg (200 lb 13.4 oz)     SpO2: 97% 99% 96% 96%    Intake/Output Summary (Last 24 hours) at 08/02/15 1035 Last data filed at  08/02/15 1000  Gross per 24 hour  Intake  560.8 ml  Output   1350 ml  Net -789.2 ml    LABS: Basic Metabolic Panel:  Recent Labs  40/98/1105/04/14 0446 08/02/15 0533  NA 135 132*  K 3.6 3.5  CL 85* 94*  CO2 29 28  GLUCOSE 159* 123*  BUN 32* 36*  CREATININE 2.12* 2.20*  CALCIUM 10.1 8.8*   Liver Function Tests: No results for input(s): AST, ALT, ALKPHOS, BILITOT, PROT, ALBUMIN in the last 72 hours. No results for input(s): LIPASE, AMYLASE in the last 72 hours. CBC:  Recent Labs  07/31/15 0410  WBC 4.3  HGB 9.5*  HCT 29.8*  MCV 94.6  PLT 174   Cardiac Enzymes: No results for input(s): CKTOTAL, CKMB, CKMBINDEX, TROPONINI in the last 72 hours. BNP: Invalid input(s): POCBNP D-Dimer: No results for input(s): DDIMER in the last 72 hours. Hemoglobin A1C: No results for input(s): HGBA1C in the last 72 hours. Fasting Lipid Panel: No results for input(s): CHOL, HDL, LDLCALC, TRIG, CHOLHDL, LDLDIRECT in the last 72 hours. Thyroid Function Tests: No results for input(s): TSH, T4TOTAL, T3FREE, THYROIDAB in the last 72 hours.  Invalid input(s): FREET3 Anemia Panel: No results for input(s): VITAMINB12, FOLATE, FERRITIN, TIBC, IRON, RETICCTPCT in the  last 72 hours.  RADIOLOGY: Mr Abdomen Wo Contrast  07/26/2015  CLINICAL DATA:  75 year old female inpatient with indeterminate right renal mass on renal ultrasound. EXAM: MRI ABDOMEN WITHOUT CONTRAST TECHNIQUE: Multiplanar multisequence MR imaging was performed without the administration of intravenous contrast. COMPARISON:  07/24/2015 renal sonogram . FINDINGS: Lower chest: Clear lung bases. Cardiomegaly. Susceptibility artifact from sternotomy wires. Hepatobiliary: Normal liver size and configuration. No hepatic steatosis. No liver mass. Focal adenomyomatosis in the fundus of the gallbladder. Otherwise normal gallbladder with no cholelithiasis. No biliary ductal dilatation. Common bile duct diameter 4 mm. No choledocholithiasis.  Pancreas: No pancreatic mass or duct dilation.  No pancreas divisum. Spleen: Normal size. No mass. Adrenals/Urinary Tract: Normal adrenals. No hydronephrosis. There is a complex 3.6 x 3.2 x 3.5 cm cystic renal mass in the lateral upper right kidney (series 13/ image 12), incompletely characterized on this noncontrast MRI, which demonstrates thickened internal septations, scattered internal subcentimeter nodular foci, and heterogeneous internal areas of restricted diffusion, suspicious for renal cell carcinoma. There are several similar-appearing homogeneous T1 hyperintense T2 hypointense renal cortical lesions in both kidneys, largest 1.1 cm in the posterior upper right kidney and 1.0 cm in the posterior upper left kidney, which are incompletely characterized on this noncontrast MRI, and are most suggestive of hemorrhagic/proteinaceous renal cysts. There is a simple appearing 0.9 cm renal cortical cyst in the mid to upper left kidney. Stomach/Bowel: Grossly normal stomach. Visualized small and large bowel is normal caliber, with no bowel wall thickening. Vascular/Lymphatic: Normal caliber abdominal aorta. No pathologically enlarged lymph nodes in the abdomen. Other: No abdominal ascites or focal fluid collection. Musculoskeletal: No aggressive appearing focal osseous lesions. IMPRESSION: 1. 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 Urology consultation advised. 2. Several small T1 hyperintense renal cortical lesions in both kidneys, incompletely characterized on this noncontrast MRI, most suggestive of hemorrhagic/proteinaceous renal cysts. Follow-up renal protocol MRI abdomen is recommended in 6 months to reassess these renal lesions, preferably without and with IV contrast if the patient's renal function has improved. 3. No noncontrast MRI evidence of metastatic disease in the abdomen. 4. Focal adenomyomatosis of the  gallbladder fundus. Otherwise normal gallbladder, with no cholelithiasis. No biliary ductal dilatation. 5. Cardiomegaly. Electronically Signed   By: Delbert PhenixJason A Poff M.D.   On: 07/26/2015 11:44   [place] Renal  07/24/2015  CLINICAL DATA:  Acute renal failure. EXAM: RENAL / URINARY TRACT ULTRASOUND COMPLETE COMPARISON:  None. FINDINGS: Right Kidney: Length: 10.2 cm. There is a 3.2 x 3.2 x 3 cm mass in the mid right kidney. This does is not demonstrate the appearance of a simple cyst and may contain solid components. No hydronephrosis. Cortical thinning is identified. Left Kidney: Length: 10.1 cm.  Cortical thinning with no hydronephrosis. Bladder: Appears normal for degree of bladder distention. IMPRESSION: 1. There is a mass in the right kidney which does not have the appearance of a simple cyst and may contain solid components. This raises the possibility of a renal neoplasm. This should be further assessed with a contrast-enhanced CT scan or MRI. MRI would be the most sensitive and specific exam. Electronically Signed   By: Gerome Samavid  Williams III M.D   On: 07/24/2015 19:09   Dg Chest Port 1 View  07/22/2015  CLINICAL DATA:  Acute on chronic CHF, mitral and aortic insufficiency, coronary artery disease, former smoker, obesity. EXAM: PORTABLE CHEST 1 VIEW COMPARISON:  Chest x-ray of December 04, 2010 FINDINGS: The lungs are adequately inflated. There is no focal infiltrate and no pleural effusion. The cardiac silhouette is enlarged. The pulmonary vascularity is engorged with mild cephalization observed. There are post CABG changes. There is calcification in the aortic arch. The PICC line tip projects over the midportion of the SVC. The observed bony thorax exhibits no acute abnormality. IMPRESSION: 1. CHF with mild pulmonary vascular congestion. There is no pneumonia. 2. Aortic atherosclerosis. Electronically Signed   By: David  [place] M.D.   On: 07/22/2015 07:33    PHYSICAL EXAM CVP ~11-12 General: NAD. Sitting in  chair Neck: JVP to jaw no thyromegaly or thyroid nodule.  Lungs: Clear to auscultation bilaterally with normal respiratory effort. CV: Lateral PMI.  Heart mildly tachy, regular S1/S2, no S3/S4, no murmur.  No edema Abdomen: Soft, nontender, no hepatosplenomegaly, no distention.  Neurologic: Alert and oriented x 3.  Psych: Normal affect. Extremities: No clubbing or cyanosis.   TELEMETRY: Reviewed personally, NSR 80-90s  ASSESSMENT AND PLAN: 75 yo with ischemic CMP and CAD s/p CABG admitted 6/22 after RHC with significant volume overload.  1. Acute on chronic systolic CHF: Ischemic cardiomyopathy. Echo with EF 25-30% and moderately decreased RV systolic function this admission. Medicine titration limited by low BP and renal dysfunction. She had RHC 6/22 => Cardiac output was relatively preserved but she was significantly volume overloaded.  Milrinone was started given renal dysfunction and need for considerable diuresis.  Co-ox  58.1% with CVP down to 7-8. Weight down about 20 lbs - Volume status up a bit.  Continue torsemide 80 po bid. Can add metolazone as needed. - Wil attempt to d/c milrinone today. Follow co-ox. Concerned that RV dysfunction and renal dysfunction could make this difficult and will make her prone to re-accumulate fluid quickly.  - Continue Bidil 1/2 tablet tid and spiro 12.5. BP soft but stable in 80-90s.  - Continue qod digoxin for now. Level 07/31/15 was 0.7. Would repeat in 1 week (08/07/15) - Continue Ted hose.  - No beta blocker or ACE/ARB for now.  - ECG with LBBB, QRS 158 msec. Can consider CRT-D when more stable but given advanced HF doubt this will be sufficient. Looking long-term, LVAD placement would be difficult given RV and renal dysfunction.  2. CAD: s/p CABG. - No chest pain since prior to admission at [facility] in 5/17. Given fall in EF, concerned for worsened CAD. Ideally, she would have coronary angiography.  However, given elevated creatinine and lack of  chest pain, would hold off for now. Continue ASA 81 and atorvastatin.  3. CKD: Stage IV. - Suspect CKD is major factor in limited diuresis.  Creatinine stable today at 2.1-2.2 range 4. 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  Seen by urology, will need followup 8-10 weeks.  5. Deconditioning  - Work with PT/rehab.   6. Hypokalemia: - Continue supp  Arvilla Meres MD 08/02/2015 10:35 AM  Advanced Heart Failure Team Pager [phone] (M-F; 7a - 4p)  Please contact [facility] for night-coverage after hours (4p -7a ) and weekends on [email or web address]

## 2015-08-02 NOTE — Progress Notes (Signed)
Patient finally voided at 03:57.

## 2015-08-03 LAB — CARBOXYHEMOGLOBIN
CARBOXYHEMOGLOBIN: 1.2 % (ref 0.5–1.5)
CARBOXYHEMOGLOBIN: 1.4 % (ref 0.5–1.5)
METHEMOGLOBIN: 1 % (ref 0.0–1.5)
METHEMOGLOBIN: 1 % (ref 0.0–1.5)
O2 Saturation: 50.1 %
O2 Saturation: 74.5 %
TOTAL HEMOGLOBIN: 10.3 g/dL — AB (ref 12.0–16.0)
TOTAL HEMOGLOBIN: 10.1 g/dL — AB (ref 12.0–16.0)

## 2015-08-03 LAB — BASIC METABOLIC PANEL
Anion gap: 13 (ref 5–15)
BUN: 39 mg/dL — AB (ref 6–20)
CHLORIDE: 92 mmol/L — AB (ref 101–111)
CO2: 28 mmol/L (ref 22–32)
CREATININE: 2.36 mg/dL — AB (ref 0.44–1.00)
Calcium: 9.2 mg/dL (ref 8.9–10.3)
GFR calc Af Amer: 23 mL/min — ABNORMAL LOW (ref >60)
GFR calc non Af Amer: 19 mL/min — ABNORMAL LOW (ref >60)
Glucose, Bld: 145 mg/dL — ABNORMAL HIGH (ref 65–99)
Potassium: 4.2 mmol/L (ref 3.5–5.1)
Sodium: 133 mmol/L — ABNORMAL LOW (ref 135–145)

## 2015-08-03 LAB — GLUCOSE, CAPILLARY
GLUCOSE-CAPILLARY: 283 mg/dL — AB (ref 65–99)
Glucose-Capillary: 140 mg/dL — ABNORMAL HIGH (ref 65–99)

## 2015-08-03 MED ORDER — MILRINONE LACTATE IN DEXTROSE 20-5 MG/100ML-% IV SOLN: 0.2500 ug/kg/min | INTRAVENOUS | Status: DC

## 2015-08-03 MED ORDER — MILRINONE LACTATE IN DEXTROSE 20-5 MG/100ML-% IV SOLN
0.2500 ug/kg/min | INTRAVENOUS | Status: DC
  Administered 2015-08-03: 0.125 ug/kg/min via INTRAVENOUS
  Filled 2015-08-03: qty 100

## 2015-08-03 MED ORDER — METOLAZONE 2.5 MG PO TABS
2.5000 mg | ORAL_TABLET | Freq: Once | ORAL | Status: AC
  Administered 2015-08-03: 2.5 mg via ORAL
  Filled 2015-08-03: qty 1

## 2015-08-03 MED ORDER — POTASSIUM CHLORIDE CRYS ER 20 MEQ PO TBCR
20.0000 meq | EXTENDED_RELEASE_TABLET | Freq: Once | ORAL | Status: AC
  Administered 2015-08-03: 20 meq via ORAL
  Filled 2015-08-03: qty 1

## 2015-08-03 MED ORDER — POTASSIUM CHLORIDE CRYS ER 20 MEQ PO TBCR: 40.0000 meq | EXTENDED_RELEASE_TABLET | Freq: Every day | ORAL | Status: DC

## 2015-08-03 MED ORDER — TORSEMIDE 20 MG PO TABS: 80.0000 mg | ORAL_TABLET | Freq: Two times a day (BID) | ORAL | Status: AC

## 2015-08-03 MED ORDER — SPIRONOLACTONE 25 MG PO TABS: 12.5000 mg | ORAL_TABLET | Freq: Every day | ORAL | Status: DC

## 2015-08-03 MED ORDER — METOLAZONE 2.5 MG PO TABS: 2.5000 mg | ORAL_TABLET | ORAL | Status: DC

## 2015-08-03 NOTE — Care Management Important Message (Signed)
Important Message  Patient Details  Name: [name] [name] MRN: [id number] Date of Birth: 03/09/1940   Medicare Important Message Given:  Yes    Bernadette HoitShoffner, Maksym Pfiffner Coleman 08/03/2015, 11:30 AM

## 2015-08-03 NOTE — Progress Notes (Signed)
Physical Therapy Treatment Patient Details Name: [name] [name] MRN: [id number] DOB: 04/14/1940 Today's Date: 08/03/2015    History of Present Illness Pt is a 75 y/o F who presented w/ dyspnea and significant LE edema.  Pt volume overloaded w/ acute on chronic systolic CHF.  Pt's PMH includes CABG, stage III CKD, DM II, sinus bradycardia, obesity.    PT Comments    Pt ambulated 120' with 1 standing rest break, HR up to 125 bpm at break but otherwise in 110's. O2 sats remained stable in upper 90's. Reviewed and practiced HEP and gave ho as well as discussing activity level at home. Family concerned that pt will go home and not mobilize as recommended, discussed this as well as the benefit of HHPT in this situation. PT will continue to follow.   Follow Up Recommendations  Home health PT;Supervision for mobility/OOB     Equipment Recommendations  None recommended by PT    Recommendations for Other Services       Precautions / Restrictions Precautions Precautions: Fall Precaution Comments: monitor O2 Restrictions Weight Bearing Restrictions: No    Mobility  Bed Mobility               General bed mobility comments: pt received in chair  Transfers Overall transfer level: Modified independent Equipment used: Rolling walker (2 wheeled) Transfers: Sit to/from Stand Sit to Stand: Modified independent (Device/Increase time)         General transfer comment: mod I for sit to stand from recliner multiple times, safe hand placement  Ambulation/Gait Ambulation/Gait assistance: Supervision Ambulation Distance (Feet): 120 Feet Assistive device: Rolling walker (2 wheeled) Gait Pattern/deviations: Step-through pattern;Decreased stride length Gait velocity: decreased Gait velocity interpretation: Below normal speed for age/gender General Gait Details: pt able to maintain good pace first 1785' with RW then fatigued and HR to 125', standing rest break and then 40'.    Stairs             Wheelchair Mobility    Modified Rankin (Stroke Patients Only)       Balance Overall balance assessment: Needs assistance Sitting-balance support: No upper extremity supported Sitting balance-Leahy Scale: Good     Standing balance support: No upper extremity supported Standing balance-Leahy Scale: Fair Standing balance comment: can stand without support for short periods                    Cognition Arousal/Alertness: Awake/alert Behavior During Therapy: WFL for tasks assessed/performed Overall Cognitive Status: Within Functional Limits for tasks assessed                      Exercises General Exercises - Lower Extremity Ankle Circles/Pumps: AROM;Both;15 reps;Seated Hip Flexion/Marching: AROM;10 reps;Standing Heel Raises: AROM;Both;10 reps;Standing Mini-Sqauts: AROM;10 reps;Standing Other Exercises Other Exercises: discussed HEP and activity level upon d/c    General Comments General comments (skin integrity, edema, etc.): O2 sats in 90's throughout session      Pertinent Vitals/Pain Pain Assessment: No/denies pain    Home Living                      Prior Function            PT Goals (current goals can now be found in the care plan section) Acute Rehab PT Goals Patient Stated Goal: to get better and go home  PT Goal Formulation: With patient Time For Goal Achievement: 08/08/15 Potential to Achieve Goals: Good Progress towards PT goals:  Progressing toward goals    Frequency  Min 3X/week    PT Plan Current plan remains appropriate    Co-evaluation             End of Session Equipment Utilized During Treatment: Gait belt Activity Tolerance: Patient limited by fatigue Patient left: in chair;with call bell/phone within reach;with family/visitor present     Time: [id number] PT Time Calculation (min) (ACUTE ONLY): 30 min  Charges:  $Gait Training: 23-37 mins                    G Codes:     Lyanne CoVictoria Josemanuel Eakins,  PT  Acute Rehab Services  [phone]  Lyanne CoManess, Cadel Stairs 08/03/2015, 3:19 PM

## 2015-08-03 NOTE — Discharge Summary (Signed)
Advanced Heart Failure Discharge Note   Discharge Summary   Patient ID: [name] [name] MRN: [id number], DOB/AGE: 73/06/1940 73 y.o. Admit date: 07/21/2015 D/C date:     08/03/2015   Primary Discharge Diagnoses:  1. Acute on chronic systolic CHF - Ischemic CM 2. CAD s/p CABG 3. CKD stage IV 4. Renal mass - Urology follow up 5. Deconditioning - HHPT ordered  Hospital Course:   [name] [name] is a 75 y.o. with h/o CAD s/p CABG, ischemic cardiomyopathy, and CKD. Presented to clinic 07/20/15 with worsening HF symptoms. She was admitted for IV diuresis after RHC 07/21/15 showed elevated filling pressures (RA 17, PCWP 27).  She had preserved cardiac output but with significant volume overload low dose milrinone started to augment diuresis.   She was placed on lasix drip up to 15 mg/hr with 2.5 mg metolazone BID. CVP up as high as 16-17. Echo 07/22/15 LVEF 25-30% down from LVEF 40%.  She was considered for Coronary angiography but this had to be deferred due with elevated creatinine in 1.9-2.1 range.    With sluggish diuresis, Lasix switched from drip to Lasix 160 IV TID with improved diuresis. She was eventually transitioned to torsemice 80 mg BID.  Renal U/S and UA checked with CKD limiting diuresis.  Incidental finding of R kidney mass concerning for Renal cell carcinoma.  Urology consulted and patient to have follow up in 8-10 weeks.   Overall she diuresed 14.6 L and down 21 lbs from admission on IV lasix. She was transitioned to torsemide 80 mg BID with once weekly metolazone. Was given 2.5 mg metolazone on day of discharge with CVP in 12-13 range.   Pt had marginal coox in high 50s-low 60s on milrinone 0.125 mcg/kg/min and did not tolerate wean with coox as low as 50%.  Arrangement was made for home milrinone at 0.25 mcg/kg/min [facility]   She will be discharged to home in stable condition with close follow up as below since we are starting her on milrinone.   Discharge Weight  Range: 199 lbs Discharge Vitals: Blood pressure 99/60, pulse 87, temperature 97.9 F (36.6 C), temperature source Oral, resp. rate 16, height 5\' 2"  (1.575 m), weight 199 lb 9.6 oz (90.538 kg), SpO2 100 %.  Labs: Lab Results  Component Value Date   WBC 4.3 07/31/2015   HGB 9.5* 07/31/2015   HCT 29.8* 07/31/2015   MCV 94.6 07/31/2015   PLT 174 07/31/2015     Recent Labs Lab 08/03/15 0436  NA 133*  K 4.2  CL 92*  CO2 28  BUN 39*  CREATININE 2.36*  CALCIUM 9.2  GLUCOSE 145*   No results found for: CHOL, HDL, LDLCALC, TRIG BNP (last 3 results)  Recent Labs  07/20/15 1146 07/21/15 1438  BNP 2211.4* 1795.7*    ProBNP (last 3 results) No results for input(s): PROBNP in the last 8760 hours.   Diagnostic Studies/Procedures    Right Heart Pressures 07/21/15 R[facility] Procedural Findings: Hemodynamics (mmHg) RA mean 17 RV 51/21 PA 57/32, mean 45 PCWP mean 27 Oxygen saturations: PA 62% AO 100% Cardiac Output (Fick) 4.5  Cardiac Index (Fick) 2.3 PVR 4 WU  Echo 07/22/15  EF 25-30% with multiple wall motion abnormalities, moderately dilated RV with moderately decreased RV systolic function, moderate MR, PASP 54 mmHg.   Renal US 07/24/15 There is a mass in the right kidney which does not have the appearance of a simple cyst and may contain solid components. This raises  the possibility of a renal neoplasm. This should be further assessed with a contrast-enhanced CT scan or MRI. MRI would be the most sensitive and specific exam.  Abdominal MRI 07/26/15 with complex right upper kidney cystic mass.   Discharge Medications     Medication List    STOP taking these medications        furosemide 40 MG tablet  Commonly known as:  LASIX      TAKE these medications        aspirin 81 MG tablet  Take 81 mg by mouth daily.     atorvastatin 40 MG tablet  Commonly known as:  LIPITOR  Take 1 tablet by mouth  daily     D-3-5 5000 units capsule  Generic drug:   Cholecalciferol  Take 5,000 Units by mouth daily.     Fish Oil 1200 MG Caps  Take 1,200-2,400 mg by mouth 2 (two) times daily. Take 2 tabs by mouth every morning & 1 tab every evening     HUMALOG MIX 75/25 (75-25) 100 UNIT/ML Susp injection  Generic drug:  insulin lispro protamine-lispro  Inject 25 Units into the skin 2 (two) times daily.     isosorbide-hydrALAZINE 20-37.5 MG tablet  Commonly known as:  BIDIL  Take 0.5 tablets by mouth 3 (three) times daily.     metolazone 2.5 MG tablet  Commonly known as:  ZAROXOLYN  Take 1 tablet (2.5 mg total) by mouth once a week. On Wednesdays with an additional 40 meq of potassium.  Start taking on:  08/10/2015     milrinone 20 MG/100 ML Soln infusion  Commonly known as:  PRIMACOR  Inject 22.625 mcg/min into the vein continuous.     nitroGLYCERIN 0.4 MG SL tablet  Commonly known as:  NITROSTAT  Place 1 tablet (0.4 mg total) under the tongue every 5 (five) minutes as needed.     potassium chloride SA 20 MEQ tablet  Commonly known as:  K-DUR,KLOR-CON  Take 2 tablets (40 mEq total) by mouth daily. Take an extra 2 tablets (40 meq total) on days you take metolazone.     ranitidine 150 MG tablet  Commonly known as:  ZANTAC  Take 1 tablet by mouth two  times daily     spironolactone 25 MG tablet  Commonly known as:  ALDACTONE  Take 0.5 tablets (12.5 mg total) by mouth daily.     torsemide 20 MG tablet  Commonly known as:  DEMADEX  Take 4 tablets (80 mg total) by mouth 2 (two) times daily.        Disposition   The patient [facility] be [facility]   Follow-up Information    Schedule an appointment as soon as possible for a visit with Crecencio McBORDEN,LES, MD.   Specialty:  Urology   Why:  8 weeks    Contact information:   [address] [place] [address] [phone]       Follow up with Marca Anconaalton McLean, MD On 08/08/2015.   Specialty:  Cardiology   Why:  at 0940 for post hospital follow up.  Please bring  all of your medications to your visit.  The code for parking is 3000.   Contact information:   [address] [place] [address] [phone]         Duration of Discharge Encounter: Greater than 35 minutes   Signed, Graciella FreerMichael Andrew Tillery PA-C 08/03/2015, 1:49 PM  Patient seen and examined with  Otilio Saber, PA-C. We discussed all aspects of the encounter. I agree with the assessment and plan as stated above.   She is stable for d/c. Have arranged for [facility] and home milrinone. Will follow in HF Clinic.   Onita Pfluger,MD 11:03 PM

## 2015-08-03 NOTE — Progress Notes (Signed)
Advance home care nurse switched milrinone to home milrinone.discharge instructions given to pt. Belongings taken home.

## 2015-08-03 NOTE — Progress Notes (Signed)
Patient ID: [name] [name], female   DOB: 1940/10/28, 75 y.o.   MRN: [id number]   SUBJECTIVE:      Weight stable at 200#. Down 20 pounds total. Co-ox 50% this am off milrinone. Creatinine up very slightly 2.2 -> 2.36  CVP 12-13. Lying in bed.  No further SOB or CP. Continues to walk with CR and PT. Going between 150-200 feet.  Dig level 0.7 on 07/31/15.   Echo: EF 25-30% with multiple wall motion abnormalities, moderately dilated RV with moderately decreased RV systolic function, moderate MR, PASP 54 mmHg.   Abdominal MRI 07/26/15 with complex right upper kidney cystic mass.   RHC Procedural Findings (07/21/15): Hemodynamics (mmHg) RA mean 17 RV 51/21 PA 57/32, mean 45 PCWP mean 27  Oxygen saturations: PA 62% AO 100%  Cardiac Output (Fick) 4.5  Cardiac Index (Fick) 2.3 PVR 4 WU      Scheduled Meds: . aspirin EC  81 mg Oral Daily  . atorvastatin  40 mg Oral q1800  . digoxin  0.0625 mg Oral QODAY  . heparin  5,000 Units Subcutaneous Q8H  . insulin aspart  0-15 Units Subcutaneous TID WC  . insulin aspart  0-5 Units Subcutaneous QHS  . insulin aspart protamine- aspart  25 Units Subcutaneous BID WC  . isosorbide-hydrALAZINE  0.5 tablet Oral TID  . potassium chloride  40 mEq Oral Daily  . sodium chloride flush  10-40 mL Intracatheter Q12H  . sodium chloride flush  3 mL Intravenous Q12H  . spironolactone  12.5 mg Oral Daily  . torsemide  80 mg Oral BID   Continuous Infusions: . milrinone     PRN Meds:.sodium chloride, acetaminophen, ondansetron (ZOFRAN) IV, sodium chloride flush, sodium chloride flush   Filed Vitals:   08/03/15 0337 08/03/15 0400 08/03/15 0500 08/03/15 0730  BP:  94/61  101/61  Pulse:  83  80  Temp: 97.8 F (36.6 C)     TempSrc: Oral     Resp:      Height:      Weight:   199 lb 9.6 oz (90.538 kg)   SpO2:  98%  92%    Intake/Output Summary (Last 24 hours) at 08/03/15 0739 Last data filed at 08/03/15 0430  Gross per 24 hour  Intake 848.22 ml   Output   1300 ml  Net -451.78 ml    LABS: Basic Metabolic Panel:  Recent Labs  78/29/56 0533 08/03/15 0436  NA 132* 133*  K 3.5 4.2  CL 94* 92*  CO2 28 28  GLUCOSE 123* 145*  BUN 36* 39*  CREATININE 2.20* 2.36*  CALCIUM 8.8* 9.2   Liver Function Tests: No results for input(s): AST, ALT, ALKPHOS, BILITOT, PROT, ALBUMIN in the last 72 hours. No results for input(s): LIPASE, AMYLASE in the last 72 hours. CBC: No results for input(s): WBC, NEUTROABS, HGB, HCT, MCV, PLT in the last 72 hours. Cardiac Enzymes: No results for input(s): CKTOTAL, CKMB, CKMBINDEX, TROPONINI in the last 72 hours. BNP: Invalid input(s): POCBNP D-Dimer: No results for input(s): DDIMER in the last 72 hours. Hemoglobin A1C: No results for input(s): HGBA1C in the last 72 hours. Fasting Lipid Panel: No results for input(s): CHOL, HDL, LDLCALC, TRIG, CHOLHDL, LDLDIRECT in the last 72 hours. Thyroid Function Tests: No results for input(s): TSH, T4TOTAL, T3FREE, THYROIDAB in the last 72 hours.  Invalid input(s): FREET3 Anemia Panel: No results for input(s): VITAMINB12, FOLATE, FERRITIN, TIBC, IRON, RETICCTPCT in the last 72 hours.  RADIOLOGY: Mr Abdomen Wo Contrast  07/26/2015  CLINICAL DATA:  75 year old female inpatient with indeterminate right renal mass on renal ultrasound. EXAM: MRI ABDOMEN WITHOUT CONTRAST TECHNIQUE: Multiplanar multisequence MR imaging was performed without the administration of intravenous contrast. COMPARISON:  07/24/2015 renal sonogram . FINDINGS: Lower chest: Clear lung bases. Cardiomegaly. Susceptibility artifact from sternotomy wires. Hepatobiliary: Normal liver size and configuration. No hepatic steatosis. No liver mass. Focal adenomyomatosis in the fundus of the gallbladder. Otherwise normal gallbladder with no cholelithiasis. No biliary ductal dilatation. Common bile duct diameter 4 mm. No choledocholithiasis. Pancreas: No pancreatic mass or duct dilation.  No pancreas  divisum. Spleen: Normal size. No mass. Adrenals/Urinary Tract: Normal adrenals. No hydronephrosis. There is a complex 3.6 x 3.2 x 3.5 cm cystic renal mass in the lateral upper right kidney (series 13/ image 12), incompletely characterized on this noncontrast MRI, which demonstrates thickened internal septations, scattered internal subcentimeter nodular foci, and heterogeneous internal areas of restricted diffusion, suspicious for renal cell carcinoma. There are several similar-appearing homogeneous T1 hyperintense T2 hypointense renal cortical lesions in both kidneys, largest 1.1 cm in the posterior upper right kidney and 1.0 cm in the posterior upper left kidney, which are incompletely characterized on this noncontrast MRI, and are most suggestive of hemorrhagic/proteinaceous renal cysts. There is a simple appearing 0.9 cm renal cortical cyst in the mid to upper left kidney. Stomach/Bowel: Grossly normal stomach. Visualized small and large bowel is normal caliber, with no bowel wall thickening. Vascular/Lymphatic: Normal caliber abdominal aorta. No pathologically enlarged lymph nodes in the abdomen. Other: No abdominal ascites or focal fluid collection. Musculoskeletal: No aggressive appearing focal osseous lesions. IMPRESSION: 1. 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 Urology consultation advised. 2. Several small T1 hyperintense renal cortical lesions in both kidneys, incompletely characterized on this noncontrast MRI, most suggestive of hemorrhagic/proteinaceous renal cysts. Follow-up renal protocol MRI abdomen is recommended in 6 months to reassess these renal lesions, preferably without and with IV contrast if the patient's renal function has improved. 3. No noncontrast MRI evidence of metastatic disease in the abdomen. 4. Focal adenomyomatosis of the gallbladder fundus. Otherwise normal gallbladder, with no  cholelithiasis. No biliary ductal dilatation. 5. Cardiomegaly. Electronically Signed   By: Delbert PhenixJason A Poff M.D.   On: 07/26/2015 11:44   [place] Renal  07/24/2015  CLINICAL DATA:  Acute renal failure. EXAM: RENAL / URINARY TRACT ULTRASOUND COMPLETE COMPARISON:  None. FINDINGS: Right Kidney: Length: 10.2 cm. There is a 3.2 x 3.2 x 3 cm mass in the mid right kidney. This does is not demonstrate the appearance of a simple cyst and may contain solid components. No hydronephrosis. Cortical thinning is identified. Left Kidney: Length: 10.1 cm.  Cortical thinning with no hydronephrosis. Bladder: Appears normal for degree of bladder distention. IMPRESSION: 1. There is a mass in the right kidney which does not have the appearance of a simple cyst and may contain solid components. This raises the possibility of a renal neoplasm. This should be further assessed with a contrast-enhanced CT scan or MRI. MRI would be the most sensitive and specific exam. Electronically Signed   By: Gerome Samavid  Williams III M.D   On: 07/24/2015 19:09   Dg Chest Port 1 View  07/22/2015  CLINICAL DATA:  Acute on chronic CHF, mitral and aortic insufficiency, coronary artery disease, former smoker, obesity. EXAM: PORTABLE CHEST 1 VIEW COMPARISON:  Chest x-ray of December 04, 2010 FINDINGS: The lungs are adequately inflated. There is no  focal infiltrate and no pleural effusion. The cardiac silhouette is enlarged. The pulmonary vascularity is engorged with mild cephalization observed. There are post CABG changes. There is calcification in the aortic arch. The PICC line tip projects over the midportion of the SVC. The observed bony thorax exhibits no acute abnormality. IMPRESSION: 1. CHF with mild pulmonary vascular congestion. There is no pneumonia. 2. Aortic atherosclerosis. Electronically Signed   By: David  Swaziland[place] M.D.   On: 07/22/2015 07:33    PHYSICAL EXAM CVP 12-13 General: NAD. Sitting in chair Neck: JVP to jaw, no thyromegaly or thyroid nodule.    Lungs: CTAB, normal effort CV: Lateral PMI.  Heart mildly tachy, regular S1/S2, no S3/S4, no murmur.  No edema Abdomen: Soft, NT, ND, no HSM. No bruits or masses. +BS  Neurologic: Alert and oriented x 3.  Psych: Normal affect. Extremities: No clubbing or cyanosis.   TELEMETRY: Reviewed personally, NSR 80-90s  ASSESSMENT AND PLAN: 75 yo with ischemic CMP and CAD s/p CABG admitted 6/22 after RHC with significant volume overload.  1. Acute on chronic systolic CHF: Ischemic cardiomyopathy. Echo with EF 25-30% and moderately decreased RV systolic function this admission. Medicine titration limited by low BP and renal dysfunction. She had RHC 6/22 => Cardiac output was relatively preserved but she was significantly volume overloaded.  Milrinone was started given renal dysfunction and need for considerable diuresis.  Weight down about 21 lbs overall.  - Volume status up a bit.  Continue torsemide 80 po bid. Will give 2.5 mg metolazone this am. Will give extra 20 meq K.  - Coox 50% this am.  Will restart milrinone at 0.25 mcg/kg/min as her coox has been marginal on 0.125. Will arrange for home.   - Continue Bidil 1/2 tablet tid and spiro 12.5. BP soft but stable in 90-100s.  - Continue qod digoxin for now. Level 07/31/15 was 0.7. Would repeat in 1 week (08/07/15) - Continue Ted hose.  - No beta blocker or ACE/ARB for now.  - ECG with LBBB, QRS 158 msec. Can consider CRT-D when more stable but given advanced HF doubt this will be sufficient. Looking long-term, LVAD placement would be difficult given RV and renal dysfunction.  2. CAD: s/p CABG. - Had 3/10 CP last night at rest that improved with tylenol. EKG unchanged from previous.  - Given fall in EF, concerned for worsened CAD. Ideally, she would have coronary angiography.  However, given elevated creatinine and lack of chest pain, would hold off for now. Continue ASA 81 and atorvastatin.  3. CKD: Stage IV. - Suspect CKD is major factor in  limited diuresis.  Creatinine mildly up to 2.2 -> 2.36 4. 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  Seen by urology, will need followup 8-10 weeks.  5. Deconditioning  - Work with PT/rehab.   6. Hypokalemia: - Resolved. Continue to supp as needed.  Graciella FreerMichael Andrew Tillery, PA-C 08/03/2015 7:39 AM  Advanced Heart Failure Team Pager [phone] (M-F; 7a - 4p)  Please contact [facility] for night-coverage after hours (4p -7a ) and weekends on [email or web address]  Patient seen and examined with Otilio SaberAndy Tillery, PA-C. We discussed all aspects of the encounter. I agree with the assessment and plan as stated above.   She has failed milrinone wean. Will resume milrinone at 0.25. Volume status mildly elevated will give one dose of metolazone.   Hopefully home today with home milrinone and [facility] coverage. Continue torsemide 80 bid with metolazone 2.5  weekly.   Carlie Corpus,MD 1:44 PM

## 2015-08-03 NOTE — Progress Notes (Signed)
Occupational Therapy Treatment Patient Details Name: [name] [name] MRN: [id number] DOB: 05/01/1940 Today's Date: 08/03/2015    History of present illness Pt is a 75 y/o F who presented w/ dyspnea and significant LE edema.  Pt volume overloaded w/ acute on chronic systolic CHF.  Pt's PMH includes CABG, stage III CKD, DM II, sinus bradycardia, obesity.   OT comments  Pt reports she walked with cardiac rehab and performed am ADLs and is fatigued.  Reviewed energy conservation techniques with pt, hand outs provided.  Pt asking appropriate questions.    Follow Up Recommendations  Home health OT;Supervision/Assistance - 24 hour    Equipment Recommendations  None recommended by OT    Recommendations for Other Services      Precautions / Restrictions Precautions Precautions: Fall       Mobility Bed Mobility                  Transfers                      Balance                                   ADL                                         General ADL Comments: Pt reports she just finished her bath and is fatigued.  Reviewed energy conservation techniques.  Hand outs provided.  Pt engaged and asking appropriate questions       Vision                     Perception     Praxis      Cognition   Behavior During Therapy: WFL for tasks assessed/performed Overall Cognitive Status: Within Functional Limits for tasks assessed                       Extremity/Trunk Assessment               Exercises     Shoulder Instructions       General Comments      Pertinent Vitals/ Pain       Pain Assessment: No/denies pain  Home Living                                          Prior Functioning/Environment              Frequency Min 2X/week     Progress Toward Goals  OT Goals(current goals can now be found in the care plan section)  Progress towards OT goals: Progressing toward  goals  ADL Goals Pt Will Perform Grooming: with modified independence;standing Pt Will Perform Upper Body Bathing: with modified independence;sitting Pt Will Perform Lower Body Bathing: with modified independence;sit to/from stand Pt Will Perform Upper Body Dressing: with modified independence;sitting Pt Will Perform Lower Body Dressing: with modified independence;sit to/from stand Pt Will Transfer to Toilet: with modified independence;ambulating;regular height toilet;bedside commode;grab bars Pt Will Perform Toileting - Clothing Manipulation and hygiene: with modified independence;sit to/from stand Pt Will Perform Tub/Shower Transfer: Tub transfer;with supervision;ambulating;shower seat;rolling walker  Plan Discharge plan remains appropriate  Co-evaluation                 End of Session     Activity Tolerance     Patient Left     Nurse Communication          Time: [id number] OT Time Calculation (min): 19 min  Charges: OT General Charges $OT Visit: 1 Procedure OT Treatments $Self Care/Home Management : 8-22 mins  Marrell Dicaprio M 08/03/2015, 1:03 PM

## 2015-08-03 NOTE — Progress Notes (Signed)
Discharged home by wheelchair, accompanied by daughter, stable.

## 2015-08-03 NOTE — Progress Notes (Signed)
CARDIAC REHAB PHASE I   PRE:  Rate/Rhythm: 86 SR  BP:  Sitting: 109/74        SaO2: 98 RA  MODE:  Ambulation: 80 ft   POST:  Rate/Rhythm: 94 SR  BP:  Sitting: 98/61         SaO2: 100 RA  Pt finishing breakfast, agreeable to walk. Pt assisted to bathroom prior to ambulation. Pt ambulated 80 ft on RA, IV, rolling walker, assist x1, slow, fairly steady gait, tolerated fair. Pt c/o DOE, fatigue, "feeling tired in her belly," brief standing rest x2. Encouraged additional ambulation today. Pt to recliner after walk, feet elevated, call bell within reach. Will follow.   [id number]  Joylene GrapesEmily C Erie Radu, RN, BSN 08/03/2015 9:07 AM

## 2015-08-03 NOTE — Progress Notes (Signed)
Alerted [name] [name] w [facility] home[facility] that pt for dc home today w iv milrinone.

## 2015-08-04 ENCOUNTER — Other Ambulatory Visit: Payer: Self-pay | Admitting: *Deleted

## 2015-08-04 DIAGNOSIS — I214 Non-ST elevation (NSTEMI) myocardial infarction: Secondary | ICD-10-CM | POA: Insufficient documentation

## 2015-08-04 DIAGNOSIS — I472 Ventricular tachycardia, unspecified: Secondary | ICD-10-CM | POA: Insufficient documentation

## 2015-08-04 DIAGNOSIS — J9601 Acute respiratory failure with hypoxia: Secondary | ICD-10-CM | POA: Insufficient documentation

## 2015-08-04 NOTE — Patient Outreach (Signed)
Transition of care call attempted. No one answered the phone. I left a message and requested a return call. I will call her again tomorrow if I don't hear from her.  Almetta LovelyCarroll Aniko Finnigan [facility] Care Manager [phone]

## 2015-08-05 ENCOUNTER — Other Ambulatory Visit: Payer: Self-pay | Admitting: *Deleted

## 2015-08-05 NOTE — Patient Outreach (Signed)
This pt does not have a [facility] primary care provider. [place] Dr. Elroy ChannelLarry Kilby. Pt came home 2 days ago from long hospital stay for HF. She is home now with milrinone drip and states she is doing well. [facility] is serving her.  Almetta LovelyCarroll Stone Spirito [facility]NP-BC [facility] Care Manager [phone]

## 2015-08-08 ENCOUNTER — Encounter (HOSPITAL_COMMUNITY): Payer: Self-pay

## 2015-08-08 ENCOUNTER — Ambulatory Visit (HOSPITAL_BASED_OUTPATIENT_CLINIC_OR_DEPARTMENT_OTHER)
Admission: RE | Admit: 2015-08-08 | Discharge: 2015-08-08 | Disposition: A | Discharge status: Home / Self Care | Payer: Medicare Other | Source: Ambulatory Visit | Attending: Cardiology | Admitting: Cardiology

## 2015-08-08 ENCOUNTER — Encounter (HOSPITAL_COMMUNITY): Payer: Self-pay | Admitting: Family Medicine

## 2015-08-08 ENCOUNTER — Emergency Department (HOSPITAL_COMMUNITY): Payer: Medicare Other

## 2015-08-08 ENCOUNTER — Inpatient Hospital Stay (HOSPITAL_COMMUNITY)
Admission: EM | Admit: 2015-08-08 | Discharge: 2015-08-16 | DRG: 291 | Disposition: A | Discharge status: Hospice | Payer: Medicare Other | Attending: Cardiology | Admitting: Cardiology

## 2015-08-08 ENCOUNTER — Other Ambulatory Visit: Payer: Self-pay

## 2015-08-08 VITALS — BP 118/58 | HR 88 | Wt 202.5 lb

## 2015-08-08 DIAGNOSIS — Z992 Dependence on renal dialysis: Secondary | ICD-10-CM | POA: Diagnosis not present

## 2015-08-08 DIAGNOSIS — E1122 Type 2 diabetes mellitus with diabetic chronic kidney disease: Secondary | ICD-10-CM

## 2015-08-08 DIAGNOSIS — I252 Old myocardial infarction: Secondary | ICD-10-CM | POA: Diagnosis not present

## 2015-08-08 DIAGNOSIS — I5023 Acute on chronic systolic (congestive) heart failure: Secondary | ICD-10-CM | POA: Diagnosis not present

## 2015-08-08 DIAGNOSIS — I447 Left bundle-branch block, unspecified: Secondary | ICD-10-CM

## 2015-08-08 DIAGNOSIS — E663 Overweight: Secondary | ICD-10-CM | POA: Diagnosis present

## 2015-08-08 DIAGNOSIS — G4733 Obstructive sleep apnea (adult) (pediatric): Secondary | ICD-10-CM | POA: Insufficient documentation

## 2015-08-08 DIAGNOSIS — Z7982 Long term (current) use of aspirin: Secondary | ICD-10-CM

## 2015-08-08 DIAGNOSIS — I959 Hypotension, unspecified: Secondary | ICD-10-CM

## 2015-08-08 DIAGNOSIS — I214 Non-ST elevation (NSTEMI) myocardial infarction: Secondary | ICD-10-CM

## 2015-08-08 DIAGNOSIS — N179 Acute kidney failure, unspecified: Secondary | ICD-10-CM | POA: Diagnosis present

## 2015-08-08 DIAGNOSIS — Z951 Presence of aortocoronary bypass graft: Secondary | ICD-10-CM | POA: Insufficient documentation

## 2015-08-08 DIAGNOSIS — R0602 Shortness of breath: Secondary | ICD-10-CM

## 2015-08-08 DIAGNOSIS — I255 Ischemic cardiomyopathy: Secondary | ICD-10-CM | POA: Diagnosis present

## 2015-08-08 DIAGNOSIS — Z66 Do not resuscitate: Secondary | ICD-10-CM | POA: Diagnosis present

## 2015-08-08 DIAGNOSIS — E875 Hyperkalemia: Secondary | ICD-10-CM | POA: Diagnosis present

## 2015-08-08 DIAGNOSIS — N189 Chronic kidney disease, unspecified: Secondary | ICD-10-CM | POA: Diagnosis not present

## 2015-08-08 DIAGNOSIS — Z515 Encounter for palliative care: Secondary | ICD-10-CM | POA: Diagnosis present

## 2015-08-08 DIAGNOSIS — Z794 Long term (current) use of insulin: Secondary | ICD-10-CM

## 2015-08-08 DIAGNOSIS — Z79899 Other long term (current) drug therapy: Secondary | ICD-10-CM

## 2015-08-08 DIAGNOSIS — I5022 Chronic systolic (congestive) heart failure: Secondary | ICD-10-CM

## 2015-08-08 DIAGNOSIS — I25810 Atherosclerosis of coronary artery bypass graft(s) without angina pectoris: Secondary | ICD-10-CM | POA: Diagnosis present

## 2015-08-08 DIAGNOSIS — N183 Chronic kidney disease, stage 3 unspecified: Secondary | ICD-10-CM

## 2015-08-08 DIAGNOSIS — N281 Cyst of kidney, acquired: Secondary | ICD-10-CM

## 2015-08-08 DIAGNOSIS — I2510 Atherosclerotic heart disease of native coronary artery without angina pectoris: Secondary | ICD-10-CM

## 2015-08-08 DIAGNOSIS — I130 Hypertensive heart and chronic kidney disease with heart failure and stage 1 through stage 4 chronic kidney disease, or unspecified chronic kidney disease: Secondary | ICD-10-CM | POA: Diagnosis not present

## 2015-08-08 DIAGNOSIS — Z8249 Family history of ischemic heart disease and other diseases of the circulatory system: Secondary | ICD-10-CM

## 2015-08-08 DIAGNOSIS — E669 Obesity, unspecified: Secondary | ICD-10-CM | POA: Diagnosis present

## 2015-08-08 DIAGNOSIS — N184 Chronic kidney disease, stage 4 (severe): Secondary | ICD-10-CM | POA: Diagnosis present

## 2015-08-08 DIAGNOSIS — E785 Hyperlipidemia, unspecified: Secondary | ICD-10-CM | POA: Diagnosis present

## 2015-08-08 DIAGNOSIS — R110 Nausea: Secondary | ICD-10-CM | POA: Diagnosis not present

## 2015-08-08 DIAGNOSIS — Z87891 Personal history of nicotine dependence: Secondary | ICD-10-CM | POA: Diagnosis not present

## 2015-08-08 DIAGNOSIS — I5043 Acute on chronic combined systolic (congestive) and diastolic (congestive) heart failure: Secondary | ICD-10-CM | POA: Diagnosis present

## 2015-08-08 DIAGNOSIS — Z6836 Body mass index (BMI) 36.0-36.9, adult: Secondary | ICD-10-CM

## 2015-08-08 DIAGNOSIS — I080 Rheumatic disorders of both mitral and aortic valves: Secondary | ICD-10-CM | POA: Diagnosis present

## 2015-08-08 LAB — CBC
HCT: 30.8 % — ABNORMAL LOW (ref 36.0–46.0)
Hemoglobin: 10.1 g/dL — ABNORMAL LOW (ref 12.0–15.0)
MCH: 30.5 pg (ref 26.0–34.0)
MCHC: 32.8 g/dL (ref 30.0–36.0)
MCV: 93.1 fL (ref 78.0–100.0)
PLATELETS: 226 10*3/uL (ref 150–400)
RBC: 3.31 MIL/uL — ABNORMAL LOW (ref 3.87–5.11)
RDW: 15.9 % — AB (ref 11.5–15.5)
WBC: 5.1 10*3/uL (ref 4.0–10.5)

## 2015-08-08 LAB — BASIC METABOLIC PANEL
Anion gap: 12 (ref 5–15)
Anion gap: 13 (ref 5–15)
BUN: 69 mg/dL — AB (ref 6–20)
BUN: 71 mg/dL — AB (ref 6–20)
CHLORIDE: 92 mmol/L — AB (ref 101–111)
CO2: 23 mmol/L (ref 22–32)
CO2: 21 mmol/L — ABNORMAL LOW (ref 22–32)
CREATININE: 3.95 mg/dL — AB (ref 0.44–1.00)
CREATININE: 4.08 mg/dL — AB (ref 0.44–1.00)
Calcium: 10 mg/dL (ref 8.9–10.3)
Calcium: 9.8 mg/dL (ref 8.9–10.3)
Chloride: 93 mmol/L — ABNORMAL LOW (ref 101–111)
GFR calc Af Amer: 12 mL/min — ABNORMAL LOW (ref >60)
GFR calc Af Amer: 12 mL/min — ABNORMAL LOW (ref >60)
GFR, EST NON AFRICAN AMERICAN: 10 mL/min — AB (ref >60)
GFR, EST NON AFRICAN AMERICAN: 10 mL/min — AB (ref >60)
Glucose, Bld: 214 mg/dL — ABNORMAL HIGH (ref 65–99)
Glucose, Bld: 207 mg/dL — ABNORMAL HIGH (ref 65–99)
Potassium: 6.2 mmol/L — ABNORMAL HIGH (ref 3.5–5.1)
Potassium: 5.9 mmol/L — ABNORMAL HIGH (ref 3.5–5.1)
SODIUM: 128 mmol/L — AB (ref 135–145)
SODIUM: 126 mmol/L — AB (ref 135–145)

## 2015-08-08 LAB — BRAIN NATRIURETIC PEPTIDE: B Natriuretic Peptide: 1613.9 pg/mL — ABNORMAL HIGH (ref 0.0–100.0)

## 2015-08-08 LAB — I-STAT TROPONIN, ED: Troponin i, poc: 0.23 ng/mL (ref 0.00–0.08)

## 2015-08-08 LAB — CBG MONITORING, ED: GLUCOSE-CAPILLARY: 236 mg/dL — AB (ref 65–99)

## 2015-08-08 MED ORDER — ISOSORB DINITRATE-HYDRALAZINE 20-37.5 MG PO TABS
1.0000 | ORAL_TABLET | Freq: Three times a day (TID) | ORAL | Status: DC
  Administered 2015-08-09: 1 via ORAL
  Filled 2015-08-08 (×2): qty 1

## 2015-08-08 MED ORDER — FAMOTIDINE 20 MG PO TABS
20.0000 mg | ORAL_TABLET | Freq: Two times a day (BID) | ORAL | Status: DC
  Administered 2015-08-08: 20 mg via ORAL
  Filled 2015-08-08: qty 1

## 2015-08-08 MED ORDER — FAMOTIDINE 20 MG PO TABS
20.0000 mg | ORAL_TABLET | Freq: Every day | ORAL | Status: DC
  Administered 2015-08-09 – 2015-08-16 (×8): 20 mg via ORAL
  Filled 2015-08-08 (×9): qty 1

## 2015-08-08 MED ORDER — FUROSEMIDE 10 MG/ML IJ SOLN
10.0000 mg/h | INTRAVENOUS | Status: DC
  Administered 2015-08-08: 10 mg/h via INTRAVENOUS
  Filled 2015-08-08: qty 25

## 2015-08-08 MED ORDER — INSULIN ASPART PROT & ASPART (70-30 MIX) 100 UNIT/ML ~~LOC~~ SUSP
25.0000 [IU] | Freq: Two times a day (BID) | SUBCUTANEOUS | Status: DC
  Administered 2015-08-09 – 2015-08-11 (×5): 25 [IU] via SUBCUTANEOUS
  Filled 2015-08-08 (×2): qty 10

## 2015-08-08 MED ORDER — INSULIN ASPART 100 UNIT/ML IV SOLN
4.0000 [IU] | Freq: Once | INTRAVENOUS | Status: AC
  Administered 2015-08-08: 4 [IU] via INTRAVENOUS
  Filled 2015-08-08: qty 1

## 2015-08-08 MED ORDER — CALCIUM GLUCONATE 10 % IV SOLN
1.0000 g | Freq: Once | INTRAVENOUS | Status: AC
  Administered 2015-08-08: 1 g via INTRAVENOUS
  Filled 2015-08-08: qty 10

## 2015-08-08 MED ORDER — ATORVASTATIN CALCIUM 40 MG PO TABS
40.0000 mg | ORAL_TABLET | Freq: Every day | ORAL | Status: DC
  Administered 2015-08-09 – 2015-08-11 (×3): 40 mg via ORAL
  Filled 2015-08-08 (×3): qty 1

## 2015-08-08 MED ORDER — VITAMIN D 1000 UNITS PO TABS
5000.0000 [IU] | ORAL_TABLET | Freq: Every day | ORAL | Status: DC
  Administered 2015-08-09 – 2015-08-12 (×4): 5000 [IU] via ORAL
  Filled 2015-08-08 (×4): qty 5

## 2015-08-08 MED ORDER — FUROSEMIDE 10 MG/ML IJ SOLN
10.0000 mg/h | INTRAMUSCULAR | Status: DC
  Filled 2015-08-08: qty 25

## 2015-08-08 MED ORDER — FUROSEMIDE 10 MG/ML IJ SOLN
120.0000 mg | Freq: Once | INTRAMUSCULAR | Status: AC
  Administered 2015-08-08: 120 mg via INTRAVENOUS
  Filled 2015-08-08: qty 12

## 2015-08-08 MED ORDER — DEXTROSE 50 % IV SOLN
1.0000 | Freq: Once | INTRAVENOUS | Status: AC
  Administered 2015-08-08: 50 mL via INTRAVENOUS
  Filled 2015-08-08: qty 50

## 2015-08-08 MED ORDER — ISOSORB DINITRATE-HYDRALAZINE 20-37.5 MG PO TABS: 1.0000 | ORAL_TABLET | Freq: Three times a day (TID) | ORAL | Status: DC

## 2015-08-08 MED ORDER — MILRINONE LACTATE IN DEXTROSE 20-5 MG/100ML-% IV SOLN
0.1250 ug/kg/min | INTRAVENOUS | Status: DC
  Administered 2015-08-09: 0.125 ug/kg/min via INTRAVENOUS
  Filled 2015-08-08: qty 100

## 2015-08-08 MED ORDER — ASPIRIN EC 81 MG PO TBEC
81.0000 mg | DELAYED_RELEASE_TABLET | Freq: Every day | ORAL | Status: DC
  Administered 2015-08-09 – 2015-08-12 (×4): 81 mg via ORAL
  Filled 2015-08-08 (×4): qty 1

## 2015-08-08 MED ORDER — METOLAZONE 2.5 MG PO TABS: 2.5000 mg | ORAL_TABLET | ORAL | Status: DC

## 2015-08-08 NOTE — ED Notes (Signed)
Dr. Rubin PayorPickering notified on pt.'s elevated Troponin.

## 2015-08-08 NOTE — Progress Notes (Signed)
Patient ID: [name] [name], female   DOB: 04/17/1940, 75 y.o.   MRN: [id number] PCP: Gracelyn NurseKilby Cardiology: Dr. Wyline MoodBranch HF Cardiology: Dr. Shirlee LatchMcLean  75 yo with history of CAD s/p CABG, ischemic cardiomyopathy, and CKD presents for CHF clinic evaluation.  She had CABG in 2002.  Last Cardiolite in 8/16 showed extensive areas of infarction with only mild peri-infarct ischemia.    In 4/17, she developed increased exertional dyspnea.  [facility] was [facility].  Prior to admission, she reports some burning across her upper chest that she thought was heartburn.  At [facility], she was started on dopamine and diuresed.  Echo showed EF < 25%, which was worsened compared to echo in 7/16 with EF 40%.    She was readmitted in 6/17 with acute on chronic systolic CHF.  She was markedly volume overloaded.  She was diuresed with high dose Lasix 160 mg IV tid + metolazone + milrinone gtt.  We were unable to titrate her off milrinone due to persistently low co-ox and she went home on milrinone 0.25.   Weight today is down 21 lbs compared to prior appointment before admission.  However, she is still short of breath with more than minimal exertion.  Generally getting around the house ok but not doing much else.  No chest pain.  No PND but sleeps with 3 pillows.   Labs (6/17): K 4.1, creatinine 2.02, ALT 55, AST 38, hgb 11.2 Labs (7/17): K 4.2, creatinine 2.36  ECG: NSR, LBBB with QRS 158 msec (6/17)  PMH: 1. OSA: Uses CPAP.  2. CAD: CABG 2002 with SVG-LAD, SVG-OM, SVG-PDA.  - Cardiolite (8/16) with EF 20%, partially reversible anterior/apical defect c/w infarction with peri-infarct ischemia.  Fixed inferior and inferolateral defect consistent with infarction.  3. Chronic systolic CHF: Ischemic cardiomyopathy:  - Echo (7/16) with EF 40%, regional wall motion abnormalities, mild-moderate MR, decreased RV systolic function.  - [facility] with EF < 25%, moderate to severe MR, RV normal.  - Echo  (6/17) with EF 25-30% with regional wall motion abnormalities, moderately RV dilation with moderately decreased systolic function, moderate MR, moderate TR.  - RHC (6/17) with mean RA 17, PA 57/32, mean PCWP 27, CI 2.3, PVR 4 WU 4. Hyperlipidemia 5. Type II diabetes. 6. CKD: Stage III.  7. 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  Seen by urology, plan for followup.  8. LBBB  SH: Married, [place], 2 children. Nonsmoker, no ETOH.   Family History  Problem Relation Age of Onset  . Hypertension     ROS: All systems reviewed and negative except as per HPI.   Current Outpatient Prescriptions  Medication Sig Dispense Refill  . aspirin 81 MG tablet Take 81 mg by mouth daily.      Marland Kitchen. atorvastatin (LIPITOR) 40 MG tablet Take 1 tablet by mouth  daily 90 tablet 3  . Cholecalciferol (D-3-5) 5000 UNITS capsule Take 5,000 Units by mouth daily.    Marland Kitchen. HUMALOG MIX 75/25 (75-25) 100 UNIT/ML SUSP injection Inject 25 Units into the skin 2 (two) times daily.     . isosorbide-hydrALAZINE (BIDIL) 20-37.5 MG tablet Take 1 tablet by mouth 3 (three) times daily. 45 tablet 3  . [START ON 08/10/2015] metolazone (ZAROXOLYN) 2.5 MG tablet Take 1 tablet (2.5 mg total) by mouth 2 (two) times a week. On Tuesday and Fridays with an additional 40 meq of potassium. 10 tablet 6  . milrinone (PRIMACOR) 20 MG/100  ML SOLN infusion Inject 22.625 mcg/min into the vein continuous. 100 mL 0  . Omega-3 Fatty Acids (FISH OIL) 1200 MG CAPS Take 1,200-2,400 mg by mouth 2 (two) times daily. Take 2 tabs by mouth every morning & 1 tab every evening    . potassium chloride SA (K-DUR,KLOR-CON) 20 MEQ tablet Take 2 tablets (40 mEq total) by mouth daily. Take an extra 2 tablets (40 meq total) on days you take metolazone. 80 tablet 6  . ranitidine (ZANTAC) 150 MG tablet Take 1 tablet by mouth two  times daily 180 tablet 3  . spironolactone (ALDACTONE) 25 MG tablet Take 0.5 tablets (12.5 mg total) by mouth daily. 15 tablet 6  .  torsemide (DEMADEX) 20 MG tablet Take 4 tablets (80 mg total) by mouth 2 (two) times daily. 240 tablet 6  . nitroGLYCERIN (NITROSTAT) 0.4 MG SL tablet Place 1 tablet (0.4 mg total) under the tongue every 5 (five) minutes as needed. (Patient not taking: Reported on 07/20/2015) 25 tablet 3   No current facility-administered medications for this encounter.   BP 118/58 mmHg  Pulse 88  Wt 202 lb 8 oz (91.853 kg)  SpO2 100% General: NAD Neck: JVP 10-12 cm, no thyromegaly or thyroid nodule.  Lungs: Clear to auscultation bilaterally with normal respiratory effort. CV: Nondisplaced PMI.  Heart regular S1/S2, no S3/S4, no murmur. 2+ edema to knees bilaterally.  No carotid bruit.  Normal pedal pulses.  Abdomen: Soft, nontender, no hepatosplenomegaly, no distention.  Skin: Intact without lesions or rashes.  Neurologic: Alert and oriented x 3.  Psych: Normal affect. Extremities: No clubbing or cyanosis.  HEENT: Normal.   Assessment/Plan: 1. Chronic systolic CHF: Ischemic cardiomyopathy.  Most recent echo in 6/17 with EF 25-30% and moderately decreased RV systolic function. Medical titration limited by low BP and renal dysfunction.  She was recently in the hospital and diuresed for acute/chronic systolic CHF.  She required milrinone and we were unable to titrate her off.  She remains on milrinone 0.25 today and is still volume overloaded. - She is off Coreg with low output and not on ACEI/ARB/spironolactone with CKD. - BP ok today, can increase Bidil to 1 tab tid. - Still volume overloaded => increase metolazone to twice a week and continue torsemide 80 mg bid. - ECG with LBBB, QRS 158 msec.  Given low output HF, not sure CRT will make a marked difference but may be candidate in the future if we can get some improvement.   - Long-term, not a transplant candidate.  LVAD would be difficult with renal dysfunction and RV dysfunction. 2. CAD: s/p CABG.  No chest pain since prior to admission at [facility] in  5/17.  Given fall in EF, concerned for worsened CAD. Ideally, she would have coronary angiography.  However, given lack of chest pain and elevated creatinine would hold off. Continue ASA 81 and atorvastatin.  3. CKD: Stage III-IV.  BMET today.   Marca Ancona 08/08/2015   Addendum: Creatinine 3.95 with K 6.2 on today's labs.  She is volume overloaded on exam.  I do not think we can manage this as an outpatient.  I have asked her to come to the ER for evaluation and treatment of high K.  Will likely need admission.  Prognosis is increasingly poor with worsening renal function despite milrinone.  She would be a poor dialysis candidate.   Marca Ancona 08/08/2015 2:56 PM

## 2015-08-08 NOTE — Patient Instructions (Signed)
Increase Bidil to 1 tab Three times a day   Increase Metolazone to twice a week, take every Tuesday and Friday  Take an extra 40 meq (2 tabs) of Potassium on Tuesday and Friday with Metolazone  Lab today  Your physician recommends that you schedule a follow-up appointment in: 1 week

## 2015-08-08 NOTE — H&P (Addendum)
ADMISSION HISTORY & PHYSICAL   Chief Complaint:  Dyspnea, fatigue, sent to the hospital for hyperkalemia and acute renal failure  Cardiologist: Dr. Loralie Champagne  Primary Care Physician: No primary care provider on file.  HPI:  This is a 75 y.o. female with a past medical history significant for CAD s/p CABG, ischemic cardiomyopathy, and CKD. She had CABG in 2002. Last Cardiolite in 8/16 showed extensive areas of infarction with only mild peri-infarct ischemia. In 4/17, she developed increased exertional dyspnea. She was admitted at [facility] in 5/17. Prior to admission, she reports some burning across her upper chest that she thought was heartburn. At [facility], she was started on dopamine and diuresed. Echo showed EF < 25%, which was worsened compared to echo in 7/16 with EF 40%. She was readmitted in 6/17 to [facility] with acute on chronic systolic CHF. She was markedly volume overloaded. She was diuresed with high dose Lasix 160 mg IV tid + metolazone + milrinone gtt. Right heart cath showed elevated left and right heart pressures, moderately reduced cardiac output and severe pulmonary hypertension. We were unable to titrate her off milrinone due to persistently low co-ox and she went home on milrinone 0.25.  She was seen in the office today by Dr. Aundra Dubin. Weight was down 21 lbs compared to prior appointment before admission. However, she is still short of breath with more than minimal exertion. Generally getting around the house ok but not doing much else. No chest pain. No PND but sleeps with 3 pillows. Lab work revealed acute on chronic renal failure with a creatinine of 3.95 and potassium of 6.2. She was felt to be volume overloaded on exam. She was referred to the emergency department for treatment of hyperkalemia. It was not felt that she would be a good dialysis candidate. 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  Alinda Money. Based on her overall poor functional status, she was not considered a candidate at this time for any intervention.  In the emergency department, she was resting comfortably in no apparent distress. She reported some shortness of breath with exertion but no chest pain. Milrinone drip was infusing. Blood pressure was less than 559 systolic. Heart rate was regular in the 90s and EKG shows a wide complex rhythm which is similar to her prior EKGs. Cardiology was asked to evaluate for acute systolic congestive heart failure in the setting of acute on chronic renal failure and hyperkalemia.  PMHx:  Past Medical History  Diagnosis Date  . Obesity   . Essential hypertension   . Hyperlipidemia   . Mitral valve insufficiency and aortic valve insufficiency     a. Mild-mod MR by echo 07/2014.  Marland Kitchen CAD (coronary artery disease)     a. s/p 3V CABG 2002 (SVG-LAD, SVG-OM, SVG-PDA).  . Diabetes mellitus ([place])   . Chronic systolic CHF (congestive heart failure) (HCC)     a. EF 40%.  . Ischemic cardiomyopathy   . Sinus bradycardia   . Hypotension     Past Surgical History  Procedure Laterality Date  . Coronary artery bypass graft  08/13/2000     Len Childs  . Cardiac catheterization N/A 07/21/2015    Procedure: Right Heart Cath;  Surgeon: Larey Dresser, MD;  Location: [facility];  Service: Cardiovascular;  Laterality: N/A;    FAMHx:  Family History  Problem Relation Age of Onset  . Hypertension  SOCHx:   reports that she quit smoking about 19 years ago. Her smoking use included Cigarettes. She started smoking about 34 years ago. She has a 22.5 pack-year smoking history. She has never used smokeless tobacco. She reports that she does not drink alcohol or use illicit drugs.  ALLERGIES:  Allergies  Allergen Reactions  . Spironolactone     Not allergic but had worsening of renal function while on this.  . Sulfonamide Derivatives     REACTION: stomach upset     ROS: Pertinent items noted in HPI and remainder of comprehensive ROS otherwise negative.   HOME MEDS:   Medication List    ASK your doctor about these medications        aspirin 81 MG tablet  Take 81 mg by mouth daily.     atorvastatin 40 MG tablet  Commonly known as:  LIPITOR  Take 1 tablet by mouth  daily     D-3-5 5000 units capsule  Generic drug:  Cholecalciferol  Take 5,000 Units by mouth daily.     Fish Oil 1200 MG Caps  Take 1,200-2,400 mg by mouth 2 (two) times daily. Take 2 tabs by mouth every morning & 1 tab every evening     HUMALOG MIX 75/25 (75-25) 100 UNIT/ML Susp injection  Generic drug:  insulin lispro protamine-lispro  Inject 25 Units into the skin 2 (two) times daily.     isosorbide-hydrALAZINE 20-37.5 MG tablet  Commonly known as:  BIDIL  Take 1 tablet by mouth 3 (three) times daily.     metolazone 2.5 MG tablet  Commonly known as:  ZAROXOLYN  Take 1 tablet (2.5 mg total) by mouth 2 (two) times a week. On Tuesday and Fridays with an additional 40 meq of potassium.  Start taking on:  08/10/2015     milrinone 20 MG/100 ML Soln infusion  Commonly known as:  PRIMACOR  Inject 22.625 mcg/min into the vein continuous.     nitroGLYCERIN 0.4 MG SL tablet  Commonly known as:  NITROSTAT  Place 1 tablet (0.4 mg total) under the tongue every 5 (five) minutes as needed.     potassium chloride SA 20 MEQ tablet  Commonly known as:  K-DUR,KLOR-CON  Take 2 tablets (40 mEq total) by mouth daily. Take an extra 2 tablets (40 meq total) on days you take metolazone.     ranitidine 150 MG tablet  Commonly known as:  ZANTAC  Take 1 tablet by mouth two  times daily     spironolactone 25 MG tablet  Commonly known as:  ALDACTONE  Take 0.5 tablets (12.5 mg total) by mouth daily.     torsemide 20 MG tablet  Commonly known as:  DEMADEX  Take 4 tablets (80 mg total) by mouth 2 (two) times daily.        LABS/IMAGING: Results for orders placed or performed during  the hospital encounter of 08/08/15 (from the past 48 hour(s))  Basic metabolic panel     Status: Abnormal   Collection Time: 08/08/15  6:53 PM  Result Value Ref Range   Sodium 126 (L) 135 - 145 mmol/L   Potassium 5.9 (H) 3.5 - 5.1 mmol/L   Chloride 92 (L) 101 - 111 mmol/L   CO2 21 (L) 22 - 32 mmol/L   Glucose, Bld 207 (H) 65 - 99 mg/dL   BUN 71 (H) 6 - 20 mg/dL   Creatinine, Ser 4.08 (H) 0.44 - 1.00 mg/dL   Calcium 9.8 8.9 - 10.3 mg/dL  GFR calc non Af Amer 10 (L) >60 mL/min   GFR calc Af Amer 12 (L) >60 mL/min    Comment: (NOTE) The eGFR has been calculated using the CKD EPI equation. This calculation has not been validated in all clinical situations. eGFR's persistently <60 mL/min signify possible Chronic Kidney Disease.    Anion gap 13 5 - 15  CBC     Status: Abnormal   Collection Time: 08/08/15  6:53 PM  Result Value Ref Range   WBC 5.1 4.0 - 10.5 K/uL   RBC 3.31 (L) 3.87 - 5.11 MIL/uL   Hemoglobin 10.1 (L) 12.0 - 15.0 g/dL   HCT 30.8 (L) 36.0 - 46.0 %   MCV 93.1 78.0 - 100.0 fL   MCH 30.5 26.0 - 34.0 pg   MCHC 32.8 30.0 - 36.0 g/dL   RDW 15.9 (H) 11.5 - 15.5 %   Platelets 226 150 - 400 K/uL  I-stat troponin, ED     Status: Abnormal   Collection Time: 08/08/15  7:04 PM  Result Value Ref Range   Troponin i, poc 0.23 (HH) 0.00 - 0.08 ng/mL   Comment NOTIFIED PHYSICIAN    Comment 3            Comment: Due to the release kinetics of cTnI, a negative result within the first hours of the onset of symptoms does not rule out myocardial infarction with certainty. If myocardial infarction is still suspected, repeat the test at appropriate intervals.    Dg Chest 2 View  08/08/2015  CLINICAL DATA:  Weakness of bilateral lower extremity swelling for 2 days. EXAM: CHEST  2 VIEW COMPARISON:  Single-view of the chest 07/22/2015. FINDINGS: There is marked cardiomegaly. Interstitial pulmonary edema is identified and new since the prior exam. No pneumothorax or pleural effusion. The  patient is status post CABG. Right PICC is unchanged. IMPRESSION: Interstitial pulmonary edema and marked cardiomegaly. Electronically Signed   By: Inge Rise M.D.   On: 08/08/2015 18:14    VITALS: Filed Vitals:   08/08/15 1838 08/08/15 1845  BP: 97/59 90/58  Pulse: 92 92  Temp:    Resp: 22 19    EXAM: General appearance: alert, mild distress and mildly obese Neck: JVD - at the earlobe cm above sternal notch and no carotid bruit Lungs: diminished breath sounds bilaterally and rales bibasilar Heart: regular rate and rhythm, S1, S2 normal, S3 present and systolic murmur: early systolic 2/6, blowing at apex Abdomen: soft, non-tender; bowel sounds normal; no masses,  no organomegaly Extremities: compression stockings in place, trace pitting Pulses: 2+ and symmetric Skin: Skin color, texture, turgor normal. No rashes or lesions Neurologic: Grossly normal Psych: Pleasant  IMPRESSION: Active Problems:   Overweight   MITRAL REGURG W/ AORTIC INSUFF, RHEUM/NON-RHEUM   CAD, ARTERY BYPASS GRAFT   Cardiomyopathy, ischemic   CKD (chronic kidney disease), stage III   Acute on chronic systolic CHF (congestive heart failure) (HCC)   Acute on chronic renal failure (HCC)   Hyperkalemia   PLAN: 1. Acute on chronic systolic congestive heart failure: Clinically volume overloaded on exam, but is in worsening renal failure with hyperkalemia. Given her reduced GFR of 12 and low urine output, I will decrease her milrinone dose to 0.125 mcg/kg/min. Plan to hold torsemide and metolazone and bolus Lasix 120 mg and start infusion at 10 mg/hr. Place Foley catheter for urine output. Continue BiDil for afterload reduction. Check CVP per shift and Co-Ox daily. 2. AKI on CKD3: Labs indicate acute renal  failure with oliguria today and GFR of 12. Despite recent diuresis, she appears clinically volume overloaded. I contacted nephrology and spoke with Dr. Mercy Moore who felt it was not necessary to see her this  evening. He reported they will see her in consultation tomorrow. Will adjust diuretics as above. Repeat creatinine in the ER is now above 4. 3. Hyperkalemia: Discontinue potassium supplementation and Aldactone. Avoid ARB's/ACE inhibitors. Discussed with ER attending and will treat with Kayexalate, etc. 4. Elevated troponin: Troponin is noted to be mildly elevated 0.23 which resulted after evaluating her. I suspect this is related to congestive heart failure / demand ischemia.  Admit to stepdown unit. Heart failure service will assume care in the morning.  Pixie Casino, MD, [facility] Attending Cardiologist [place] C Sajan [place] 08/08/2015, 7:45 PM

## 2015-08-08 NOTE — ED Provider Notes (Signed)
CSN: [id number]     Arrival date & time 08/08/15  1644 History   First MD Initiated Contact with Patient 08/08/15 1738     Chief Complaint  Patient presents with  . Fatigue  . Leg Swelling      The history is provided by the patient.  Patient presents from the heart failure clinic with worsening renal function and hyperkalemia. Recent inpatient admission is been started on milrinone. Has an EF of around 25%. States she's been pretty stable since her discharge in the hospital 5 days ago. Some shortness of breath. Her weight is actually down from her admission weight. No fevers. No chest pain.  Past Medical History  Diagnosis Date  . Obesity   . Essential hypertension   . Hyperlipidemia   . Mitral valve insufficiency and aortic valve insufficiency     a. Mild-mod MR by echo 07/2014.  Marland Kitchen CAD (coronary artery disease)     a. s/p 3V CABG 2002 (SVG-LAD, SVG-OM, SVG-PDA).  . Diabetes mellitus (HCC)   . Chronic systolic CHF (congestive heart failure) (HCC)     a. EF 40%.  . Ischemic cardiomyopathy   . Sinus bradycardia   . Hypotension    Past Surgical History  Procedure Laterality Date  . Coronary artery bypass graft  08/13/2000     [name]  . Cardiac catheterization N/A 07/21/2015    Procedure: Right Heart Cath;  Surgeon: Laurey Morale, MD;  Location: [facility] INVASIVE CV LAB;  Service: Cardiovascular;  Laterality: N/A;   Family History  Problem Relation Age of Onset  . Hypertension     Social History  Substance Use Topics  . Smoking status: Former Smoker -- 1.50 packs/day for 15 years    Types: Cigarettes    Start date: 08/03/1981    Quit date: 01/30/1996  . Smokeless tobacco: Never Used  . Alcohol Use: No   OB History    No data available     Review of Systems  Constitutional: Negative for activity change and appetite change.  Eyes: Negative for pain.  Respiratory: Positive for shortness of breath. Negative for chest tightness.   Cardiovascular: Negative for  chest pain.  Gastrointestinal: Negative for nausea, abdominal pain and diarrhea.  Genitourinary: Negative for flank pain.  Musculoskeletal: Negative for back pain and neck stiffness.  Skin: Negative for rash.  Neurological: Negative for weakness, numbness and headaches.  Psychiatric/Behavioral: Negative for behavioral problems.      Allergies  Spironolactone and Sulfonamide derivatives  Home Medications   Prior to Admission medications   Medication Sig Start Date End Date Taking? Authorizing Provider  aspirin 81 MG tablet Take 81 mg by mouth daily.     Yes Historical Provider, MD  atorvastatin (LIPITOR) 40 MG tablet Take 1 tablet by mouth  daily Patient taking differently: Take 1 tablet by mouth daily in evening 04/05/15  Yes Antoine Poche, MD  Cholecalciferol (D-3-5) 5000 UNITS capsule Take 5,000 Units by mouth daily.   Yes Historical Provider, MD  HUMALOG MIX 75/25 (75-25) 100 UNIT/ML SUSP injection Inject 25 Units into the skin 2 (two) times daily.  03/12/13  Yes Historical Provider, MD  isosorbide-hydrALAZINE (BIDIL) 20-37.5 MG tablet Take 1 tablet by mouth 3 (three) times daily. Patient taking differently: Take 0.5 tablets by mouth 3 (three) times daily.  08/08/15  Yes Laurey Morale, MD  metolazone (ZAROXOLYN) 2.5 MG tablet Take 1 tablet (2.5 mg total) by mouth 2 (two) times a week. On Tuesday and  Fridays with an additional 40 meq of potassium. 08/10/15  Yes Laurey Moralealton S McLean, MD  milrinone [facility](PRIMACOR) 20 MG/100 ML SOLN infusion Inject 22.625 mcg/min into the vein continuous. 08/03/15  Yes Graciella FreerMichael Andrew Tillery, PA-C  nitroGLYCERIN (NITROSTAT) 0.4 MG SL tablet Place 1 tablet (0.4 mg total) under the tongue every 5 (five) minutes as needed. Patient taking differently: Place 0.4 mg under the tongue every 5 (five) minutes as needed for chest pain.  09/09/14  Yes Dayna N Dunn, PA-C  Omega-3 Fatty Acids (FISH OIL) 1200 MG CAPS Take 1,200-2,400 mg by mouth 2 (two) times daily. Take 2 tabs by  mouth every morning & 1 tab every evening   Yes Historical Provider, MD  potassium chloride SA (K-DUR,KLOR-CON) 20 MEQ tablet Take 2 tablets (40 mEq total) by mouth daily. Take an extra 2 tablets (40 meq total) on days you take metolazone. 08/03/15  Yes Graciella FreerMichael Andrew Tillery, PA-C  ranitidine (ZANTAC) 150 MG tablet Take 1 tablet by mouth two  times daily 03/18/15  Yes Antoine PocheJonathan F Branch, MD  spironolactone (ALDACTONE) 25 MG tablet Take 0.5 tablets (12.5 mg total) by mouth daily. 08/03/15  Yes Graciella FreerMichael Andrew Tillery, PA-C  torsemide (DEMADEX) 20 MG tablet Take 4 tablets (80 mg total) by mouth 2 (two) times daily. 08/03/15  Yes Mariam DollarMichael Andrew Tillery, PA-C   BP 102/64 mmHg  Pulse 90  Temp(Src) 98.3 F (36.8 C) (Oral)  Resp 21  SpO2 100% Physical Exam  Constitutional: She appears well-developed.  HENT:  Head: Atraumatic.  Neck: JVD present.  Cardiovascular: Normal rate.   Pulmonary/Chest: She has rales.  Abdominal: There is no tenderness.  Musculoskeletal:  Bilateral lower extremity edema. PICC line right upper extremity with infusing milrinone.  Neurological: She is alert.  Skin: Skin is warm.    ED Course  Procedures (including critical care time) Labs Review Labs Reviewed  BASIC METABOLIC PANEL - Abnormal; Notable for the following:    Sodium 126 (*)    Potassium 5.9 (*)    Chloride 92 (*)    CO2 21 (*)    Glucose, Bld 207 (*)    BUN 71 (*)    Creatinine, Ser 4.08 (*)    GFR calc non Af Amer 10 (*)    GFR calc Af Amer 12 (*)    All other components within normal limits  CBC - Abnormal; Notable for the following:    RBC 3.31 (*)    Hemoglobin 10.1 (*)    HCT 30.8 (*)    RDW 15.9 (*)    All other components within normal limits  BRAIN NATRIURETIC PEPTIDE - Abnormal; Notable for the following:    B Natriuretic Peptide 1613.9 (*)    All other components within normal limits  I-STAT TROPOININ, ED - Abnormal; Notable for the following:    Troponin i, poc 0.23 (*)    All other  components within normal limits  CARBOXYHEMOGLOBIN    Imaging Review Dg Chest 2 View  08/08/2015  CLINICAL DATA:  Weakness of bilateral lower extremity swelling for 2 days. EXAM: CHEST  2 VIEW COMPARISON:  Single-view of the chest 07/22/2015. FINDINGS: There is marked cardiomegaly. Interstitial pulmonary edema is identified and new since the prior exam. No pneumothorax or pleural effusion. The patient is status post CABG. Right PICC is unchanged. IMPRESSION: Interstitial pulmonary edema and marked cardiomegaly. Electronically Signed   By: Drusilla Kannerhomas  Dalessio M.D.   On: 08/08/2015 18:14   I have personally reviewed and evaluated these images and  lab results as part of my medical decision-making.   EKG Interpretation   Date/Time:  Monday August 08 2015 18:37:04 EDT Ventricular Rate:  92 PR Interval:    QRS Duration: 138 QT Interval:  403 QTC Calculation: 499 R Axis:   153 Text Interpretation:  Sinus rhythm Nonspecific intraventricular conduction  delay Confirmed by Rubin Payor  MD, Harrold Donath [phone]) on 08/08/2015 6:42:55 PM      MDM   Final diagnoses:  Acute on chronic systolic and diastolic heart failure, NYHA class 4 (HCC)  Acute on chronic renal failure (HCC)  Hyperkalemia  NSTEMI (non-ST elevated myocardial infarction) [facility])    Patient with hyperkalemia and worsening renal failure. She is already on milrinone drip. Admit to cardiology. Given calcium and insulin to help decrease the potassium. Admit to stepdown.  CRITICAL CARE Performed by: Billee Cashing Total critical care time: 30 minutes Critical care time was exclusive of separately billable procedures and treating other patients. Critical care was necessary to treat or prevent imminent or life-threatening deterioration. Critical care was time spent personally by me on the following activities: development of treatment plan with patient and/or surrogate as well as nursing, discussions with consultants, evaluation of patient's  response to treatment, examination of patient, obtaining history from patient or surrogate, ordering and performing treatments and interventions, ordering and review of laboratory studies, ordering and review of radiographic studies, pulse oximetry and re-evaluation of patient's condition.     Benjiman Core, MD 08/08/15 2017

## 2015-08-08 NOTE — ED Notes (Signed)
Pt sent here by her doctor with low Na, elevated K and acute kidney failure. Pt has been weak and some leg swelling. Recent hospital admission.

## 2015-08-09 ENCOUNTER — Ambulatory Visit: Payer: Medicare Other | Admitting: Cardiology

## 2015-08-09 LAB — BASIC METABOLIC PANEL
Anion gap: 11 (ref 5–15)
Anion gap: 14 (ref 5–15)
BUN: 67 mg/dL — AB (ref 6–20)
BUN: 67 mg/dL — AB (ref 6–20)
CALCIUM: 9.8 mg/dL (ref 8.9–10.3)
CHLORIDE: 94 mmol/L — AB (ref 101–111)
CO2: 22 mmol/L (ref 22–32)
CO2: 21 mmol/L — ABNORMAL LOW (ref 22–32)
CREATININE: 3.56 mg/dL — AB (ref 0.44–1.00)
CREATININE: 3.52 mg/dL — AB (ref 0.44–1.00)
Calcium: 9.9 mg/dL (ref 8.9–10.3)
Chloride: 95 mmol/L — ABNORMAL LOW (ref 101–111)
GFR calc Af Amer: 14 mL/min — ABNORMAL LOW (ref >60)
GFR calc non Af Amer: 12 mL/min — ABNORMAL LOW (ref >60)
GFR, EST AFRICAN AMERICAN: 14 mL/min — AB (ref >60)
GFR, EST NON AFRICAN AMERICAN: 12 mL/min — AB (ref >60)
GLUCOSE: 191 mg/dL — AB (ref 65–99)
Glucose, Bld: 227 mg/dL — ABNORMAL HIGH (ref 65–99)
Potassium: 4.2 mmol/L (ref 3.5–5.1)
Potassium: 4.2 mmol/L (ref 3.5–5.1)
SODIUM: 128 mmol/L — AB (ref 135–145)
SODIUM: 129 mmol/L — AB (ref 135–145)

## 2015-08-09 LAB — GLUCOSE, CAPILLARY
GLUCOSE-CAPILLARY: 166 mg/dL — AB (ref 65–99)
GLUCOSE-CAPILLARY: 214 mg/dL — AB (ref 65–99)
GLUCOSE-CAPILLARY: 174 mg/dL — AB (ref 65–99)
Glucose-Capillary: 234 mg/dL — ABNORMAL HIGH (ref 65–99)

## 2015-08-09 LAB — RENAL FUNCTION PANEL
ALBUMIN: 3.2 g/dL — AB (ref 3.5–5.0)
Anion gap: 15 (ref 5–15)
BUN: 67 mg/dL — AB (ref 6–20)
CHLORIDE: 94 mmol/L — AB (ref 101–111)
CO2: 22 mmol/L (ref 22–32)
CREATININE: 3.51 mg/dL — AB (ref 0.44–1.00)
Calcium: 9.9 mg/dL (ref 8.9–10.3)
GFR, EST AFRICAN AMERICAN: 14 mL/min — AB (ref >60)
GFR, EST NON AFRICAN AMERICAN: 12 mL/min — AB (ref >60)
Glucose, Bld: 225 mg/dL — ABNORMAL HIGH (ref 65–99)
PHOSPHORUS: 6.1 mg/dL — AB (ref 2.5–4.6)
POTASSIUM: 4.3 mmol/L (ref 3.5–5.1)
Sodium: 131 mmol/L — ABNORMAL LOW (ref 135–145)

## 2015-08-09 LAB — TROPONIN I
TROPONIN I: 0.2 ng/mL — AB (ref <0.03)
Troponin I: 0.24 ng/mL (ref <0.03)
Troponin I: 0.17 ng/mL (ref <0.03)

## 2015-08-09 LAB — CARBOXYHEMOGLOBIN
CARBOXYHEMOGLOBIN: 1.6 % — AB (ref 0.5–1.5)
CARBOXYHEMOGLOBIN: 1.7 % — AB (ref 0.5–1.5)
Methemoglobin: 0.7 % (ref 0.0–1.5)
Methemoglobin: 0.7 % (ref 0.0–1.5)
O2 SAT: 51.8 %
O2 SAT: 56.5 %
TOTAL HEMOGLOBIN: 9.8 g/dL — AB (ref 12.0–16.0)
TOTAL HEMOGLOBIN: 9 g/dL — AB (ref 12.0–16.0)

## 2015-08-09 LAB — MAGNESIUM: Magnesium: 1.8 mg/dL (ref 1.7–2.4)

## 2015-08-09 LAB — AMMONIA: Ammonia: 27 umol/L (ref 9–35)

## 2015-08-09 MED ORDER — ACETAMINOPHEN 325 MG PO TABS
650.0000 mg | ORAL_TABLET | ORAL | Status: DC | PRN
  Administered 2015-08-09: 650 mg via ORAL
  Filled 2015-08-09: qty 2

## 2015-08-09 MED ORDER — SODIUM CHLORIDE 0.9 % IV SOLN: 250.0000 mL | INTRAVENOUS | Status: DC | PRN

## 2015-08-09 MED ORDER — FUROSEMIDE 10 MG/ML IJ SOLN
160.0000 mg | Freq: Three times a day (TID) | INTRAMUSCULAR | Status: DC
  Administered 2015-08-09 – 2015-08-16 (×21): 160 mg via INTRAVENOUS
  Filled 2015-08-09 (×27): qty 16

## 2015-08-09 MED ORDER — ALPRAZOLAM 0.25 MG PO TABS: 0.2500 mg | ORAL_TABLET | Freq: Two times a day (BID) | ORAL | Status: DC | PRN

## 2015-08-09 MED ORDER — HEPARIN SODIUM (PORCINE) 5000 UNIT/ML IJ SOLN
5000.0000 [IU] | Freq: Three times a day (TID) | INTRAMUSCULAR | Status: DC
  Administered 2015-08-09 – 2015-08-12 (×9): 5000 [IU] via SUBCUTANEOUS
  Filled 2015-08-09 (×8): qty 1

## 2015-08-09 MED ORDER — NOREPINEPHRINE BITARTRATE 1 MG/ML IV SOLN
0.0000 ug/min | INTRAVENOUS | Status: DC
  Administered 2015-08-09: 2 ug/min via INTRAVENOUS
  Filled 2015-08-09 (×2): qty 16

## 2015-08-09 MED ORDER — ENOXAPARIN SODIUM 30 MG/0.3ML ~~LOC~~ SOLN: 30.0000 mg | Freq: Every day | SUBCUTANEOUS | Status: DC

## 2015-08-09 MED ORDER — ZOLPIDEM TARTRATE 5 MG PO TABS: 5.0000 mg | ORAL_TABLET | Freq: Every evening | ORAL | Status: DC | PRN

## 2015-08-09 MED ORDER — SODIUM POLYSTYRENE SULFONATE 15 GM/60ML PO SUSP
30.0000 g | Freq: Once | ORAL | Status: AC
  Administered 2015-08-09: 30 g via ORAL
  Filled 2015-08-09: qty 120

## 2015-08-09 MED ORDER — SODIUM CHLORIDE 0.9% FLUSH: 10.0000 mL | INTRAVENOUS | Status: DC | PRN

## 2015-08-09 MED ORDER — ALTEPLASE 2 MG IJ SOLR
2.0000 mg | Freq: Once | INTRAMUSCULAR | Status: AC
Start: 2015-08-09 — End: 2015-08-09
  Administered 2015-08-09: 2 mg
  Filled 2015-08-09: qty 2

## 2015-08-09 MED ORDER — INSULIN ASPART 100 UNIT/ML ~~LOC~~ SOLN
0.0000 [IU] | Freq: Three times a day (TID) | SUBCUTANEOUS | Status: DC
  Administered 2015-08-09 (×2): 5 [IU] via SUBCUTANEOUS
  Administered 2015-08-10: 3 [IU] via SUBCUTANEOUS
  Administered 2015-08-10: 8 [IU] via SUBCUTANEOUS
  Administered 2015-08-10: 2 [IU] via SUBCUTANEOUS
  Administered 2015-08-11: 3 [IU] via SUBCUTANEOUS
  Administered 2015-08-11: 2 [IU] via SUBCUTANEOUS
  Administered 2015-08-12 (×2): 3 [IU] via SUBCUTANEOUS

## 2015-08-09 MED ORDER — SODIUM CHLORIDE 0.9% FLUSH: 3.0000 mL | INTRAVENOUS | Status: DC | PRN

## 2015-08-09 MED ORDER — FUROSEMIDE 10 MG/ML IJ SOLN
160.0000 mg | Freq: Two times a day (BID) | INTRAVENOUS | Status: DC
  Filled 2015-08-09: qty 16

## 2015-08-09 MED ORDER — SODIUM CHLORIDE 0.9% FLUSH
3.0000 mL | Freq: Two times a day (BID) | INTRAVENOUS | Status: DC
  Administered 2015-08-09 – 2015-08-12 (×7): 3 mL via INTRAVENOUS

## 2015-08-09 MED ORDER — SODIUM CHLORIDE 0.9% FLUSH
10.0000 mL | Freq: Two times a day (BID) | INTRAVENOUS | Status: DC
  Administered 2015-08-09: 10 mL
  Administered 2015-08-09: 20 mL
  Administered 2015-08-10: 10 mL
  Administered 2015-08-11: 20 mL
  Administered 2015-08-11 – 2015-08-15 (×5): 10 mL

## 2015-08-09 MED ORDER — ONDANSETRON HCL 4 MG/2ML IJ SOLN
4.0000 mg | Freq: Four times a day (QID) | INTRAMUSCULAR | Status: DC | PRN
  Administered 2015-08-09 – 2015-08-12 (×3): 4 mg via INTRAVENOUS
  Filled 2015-08-09 (×3): qty 2

## 2015-08-09 MED ORDER — DOBUTAMINE IN D5W 4-5 MG/ML-% IV SOLN
5.0000 ug/kg/min | INTRAVENOUS | Status: DC
  Administered 2015-08-09: 3 ug/kg/min via INTRAVENOUS
  Administered 2015-08-12: 5 ug/kg/min via INTRAVENOUS
  Filled 2015-08-09 (×3): qty 250

## 2015-08-09 NOTE — Consult Note (Signed)
Steva ColderDorothy C Galasso Admit Date: 08/08/2015 08/09/2015 Arita MissSANFORD, Raman Featherston B Requesting Physician:  Shirlee LatchMcLean  Reason for Consult:  AoCKD, decompensated CHF HPI:  A [age] year old female with past medical history significant for CKD stage IV (SCr aroudn 2), CAD s/p CABG, ischemic cardiomyopathy (last TTE 07/22/15 showed an LVEF 25-30% w/moderately decreased RV systolic function), hypertension, diabetes type 2, obesity, dyslipidemia, and right renal mass admitted on 7/10 with acute on chronic HFrEF.   The patient was recently was admitted 6/21 for the same acute/chronic HFrEF and ended up being discharged on home milrinone on 7/5. She is closely followed by the heart failure team, last seen in the clinic on 7/10 and found to be in volume overload with hyperkalemia 6.2 and AKI on labs. Additionally she is being followed by Urology for right renal mass suspicious for neoplasm.   She was treated for hyperkalemia and placed on lasix gtt at 10mg /h, but has remained significantly overloaded with CVP 22, co-ox 52%r, and mildly hypotensive. Her milrinone was stopped and placed on dobutamine 3 mcg/kg/min and lasix changed to 160mg  q 8hr with some UOP. She is not a candiate for LVAD given renal failure and RV failure. Nephrology consulted for difficult management of HFrEF and AKI.   Family is at bedside. Largely appear to be unfamiliar with any kidney disease but it does sound like [facility]. Discussed the complicated situation and limited options to adequately treat her heart failure. Discussed the role, if any of long or short term dialysis and family recognizes the difficulty of the situation.   CREAT (mg/dL)  Date Value  40/98/119108/15/2016 1.96*  09/09/2014 2.14*   CREATININE, SER (mg/dL)  Date Value  47/82/956207/11/2015 3.56*  08/08/2015 4.08*  08/08/2015 3.95*  08/03/2015 2.36*  08/02/2015 2.20*  08/01/2015 2.12*  07/31/2015 2.06*  07/30/2015 2.09*  07/29/2015 2.23*   07/28/2015 2.12*  ] I/Os: I/O last 3 completed shifts: In: -  Out: 700 [Urine:700]  ROS Balance of 12 systems is negative w/ exceptions as above  PMH  Past Medical History  Diagnosis Date  . Obesity   . Essential hypertension   . Hyperlipidemia   . Mitral valve insufficiency and aortic valve insufficiency     a. Mild-mod MR by echo 07/2014.  Marland Kitchen. CAD (coronary artery disease)     a. s/p 3V CABG 2002 (SVG-LAD, SVG-OM, SVG-PDA).  . Diabetes mellitus (HCC)   . Chronic systolic CHF (congestive heart failure) (HCC)     a. EF 40%.  . Ischemic cardiomyopathy   . Sinus bradycardia   . Hypotension    PSH  Past Surgical History  Procedure Laterality Date  . Coronary artery bypass graft  08/13/2000     Mikey BussingVan, Trigt Peter Iii  . Cardiac catheterization N/A 07/21/2015    Procedure: Right Heart Cath;  Surgeon: Laurey Moralealton S McLean, MD;  [facility];  Service: Cardiovascular;  Laterality: N/A;   FH  Family History  Problem Relation Age of Onset  . Hypertension     SH  reports that she quit smoking about 19 years ago. Her smoking use included Cigarettes. She started smoking about 34 years ago. She has a 22.5 pack-year smoking history. She has never used smokeless tobacco. She reports that she does not drink alcohol or use illicit drugs. Allergies  Allergies  Allergen Reactions  . Spironolactone Other (See Comments)    Not allergic but had worsening of renal function while on this.  . Sulfonamide  Derivatives Other (See Comments)    REACTION: stomach upset   Home medications Prior to Admission medications   Medication Sig Start Date End Date Taking? Authorizing Provider  aspirin 81 MG tablet Take 81 mg by mouth daily.     Yes Historical Provider, MD  atorvastatin (LIPITOR) 40 MG tablet Take 1 tablet by mouth  daily Patient taking differently: Take 1 tablet by mouth daily in evening 04/05/15  Yes Antoine Poche, MD  Cholecalciferol (D-3-5) 5000 UNITS capsule Take 5,000  Units by mouth daily.   Yes Historical Provider, MD  HUMALOG MIX 75/25 (75-25) 100 UNIT/ML SUSP injection Inject 25 Units into the skin 2 (two) times daily.  03/12/13  Yes Historical Provider, MD  isosorbide-hydrALAZINE (BIDIL) 20-37.5 MG tablet Take 1 tablet by mouth 3 (three) times daily. Patient taking differently: Take 0.5 tablets by mouth 3 (three) times daily.  08/08/15  Yes Laurey Morale, MD  metolazone (ZAROXOLYN) 2.5 MG tablet Take 1 tablet (2.5 mg total) by mouth 2 (two) times a week. On Tuesday and Fridays with an additional 40 meq of potassium. 08/10/15  Yes Laurey Morale, MD  milrinone [facility]) 20 MG/100 ML SOLN infusion Inject 22.625 mcg/min into the vein continuous. 08/03/15  Yes Graciella Freer, PA-C  nitroGLYCERIN (NITROSTAT) 0.4 MG SL tablet Place 1 tablet (0.4 mg total) under the tongue every 5 (five) minutes as needed. Patient taking differently: Place 0.4 mg under the tongue every 5 (five) minutes as needed for chest pain.  09/09/14  Yes Dayna N Dunn, PA-C  Omega-3 Fatty Acids (FISH OIL) 1200 MG CAPS Take 1,200-2,400 mg by mouth 2 (two) times daily. Take 2 tabs by mouth every morning & 1 tab every evening   Yes Historical Provider, MD  potassium chloride SA (K-DUR,KLOR-CON) 20 MEQ tablet Take 2 tablets (40 mEq total) by mouth daily. Take an extra 2 tablets (40 meq total) on days you take metolazone. 08/03/15  Yes Graciella Freer, PA-C  ranitidine (ZANTAC) 150 MG tablet Take 1 tablet by mouth two  times daily 03/18/15  Yes Antoine Poche, MD  spironolactone (ALDACTONE) 25 MG tablet Take 0.5 tablets (12.5 mg total) by mouth daily. 08/03/15  Yes Graciella Freer, PA-C  torsemide (DEMADEX) 20 MG tablet Take 4 tablets (80 mg total) by mouth 2 (two) times daily. 08/03/15  Yes Graciella Freer, PA-C    Current Medications Scheduled Meds: . alteplase  2 mg Intracatheter Once  . aspirin EC  81 mg Oral Daily  . atorvastatin  40 mg Oral q1800  . cholecalciferol   5,000 Units Oral Daily  . famotidine  20 mg Oral Daily  . furosemide  160 mg Intravenous Q8H  . heparin subcutaneous  5,000 Units Subcutaneous Q8H  . insulin aspart  0-15 Units Subcutaneous TID WC  . insulin aspart protamine- aspart  25 Units Subcutaneous BID WC  . sodium chloride flush  10-40 mL Intracatheter Q12H  . sodium chloride flush  3 mL Intravenous Q12H   Continuous Infusions: . DOBUTamine 3 mcg/kg/min (08/09/15 0800)   PRN Meds:.sodium chloride, acetaminophen, ALPRAZolam, ondansetron (ZOFRAN) IV, sodium chloride flush, sodium chloride flush, zolpidem  CBC  Recent Labs Lab 08/08/15 1853  WBC 5.1  HGB 10.1*  HCT 30.8*  MCV 93.1  PLT 226   Basic Metabolic Panel  Recent Labs Lab 08/03/15 0436 08/08/15 1015 08/08/15 1853 08/09/15 0932  NA 133* 128* 126* 128*  K 4.2 6.2* 5.9* 4.2  CL 92* 93* 92* 95*  CO2 28 23 21* 22  GLUCOSE 145* 214* 207* 191*  BUN 39* 69* 71* 67*  CREATININE 2.36* 3.95* 4.08* 3.56*  CALCIUM 9.2 10.0 9.8 9.9    Physical Exam  Blood pressure 86/59, pulse 103, temperature 97.5 F (36.4 C), temperature source Oral, resp. rate 20, weight 91.2 kg (201 lb 1 oz), SpO2 100 %. GEN: frail, elderly female, falls asleep in room ENT: NCAT EYES: EOMI CV: RRR PULM: bibasilar crackles ABD: s/nt/nd SKIN: no rashes/lesiosn EXT:2+ LEE   Assessment 75F with decompnesated sHF from ICM, AoCKD4 from cardiorenal syndrome, failure to thrive.    1. 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 Decompensated sCHF 2/2 ICM on dobutamine gtt 3. Hypervolemia on bolus lasix  Plan 1. Family informed of challenges and difficulties of current issues, receptive to this 2. Given improvement since admission cont current course of bolus lasix + dobutamine 3. No CRRT indicated as of right now, if does develop need would be short term to try to get her optimized and see if can recover some GFR Daily weights, Daily Renal Panel, Strict I/Os, Avoid nephrotoxins (NSAIDs, judicious IV  Contrast)    Sabra Heck MD [phone] pgr 08/09/2015, 1:41 PM

## 2015-08-09 NOTE — Progress Notes (Signed)
Shirlee LatchMcLean called and notified about pt blood pressures of systolic less than 90. Verbal orders to titrate Levo to 4 mcg. Will titrate and continue to monitor patient.

## 2015-08-09 NOTE — Progress Notes (Signed)
Pt's foley catheter came out when transferring pt from chair to bed.  Unsure how this happened.  Pt did not feel any pressure or pulling.  Pt has had decreased urine output as the day has continued.  MD made aware.  Pt attempted to use bedpan without success. Bladder scan done showing only 72ml.  Will continue to monitor and update as needed. MD ordered it was ok to insert new foley if pt having difficulty tonight.

## 2015-08-09 NOTE — ED Notes (Signed)
Spoke with Dr. Sullivan LoneGilbert, will initiate milrinone at 0.11625mcg/kg/min. Dr. Sullivan LoneGilbert also aware of pts blood pressure.

## 2015-08-09 NOTE — Progress Notes (Addendum)
Patient ID: [name] [name], female   DOB: [birth year], 75 y.o.   MRN: [id number]    SUBJECTIVE: Feels ok today.  Some UOP but not vigorous.  Got Kayexalate in ER last night for high K.  Co-ox 52% with CVP 22 this morning.   Scheduled Meds: . aspirin EC  81 mg Oral Daily  . atorvastatin  40 mg Oral q1800  . cholecalciferol  5,000 Units Oral Daily  . enoxaparin (LOVENOX) injection  30 mg Subcutaneous Daily  . famotidine  20 mg Oral Daily  . furosemide  160 mg Intravenous BID  . insulin aspart  0-15 Units Subcutaneous TID WC  . insulin aspart protamine- aspart  25 Units Subcutaneous BID WC  . sodium chloride flush  3 mL Intravenous Q12H  . sodium polystyrene  30 g Oral Once   Continuous Infusions: . DOBUTamine     PRN Meds:.sodium chloride, acetaminophen, ALPRAZolam, ondansetron (ZOFRAN) IV, sodium chloride flush, zolpidem    Filed Vitals:   08/09/15 0615 08/09/15 0639 08/09/15 0645 08/09/15 0650  BP: 108/66 100/65 95/70   Pulse: 93  92 92  Temp:      TempSrc:      Resp: 21 18 21 19   Weight:      SpO2: 100%  100% 100%    Intake/Output Summary (Last 24 hours) at 08/09/15 0725 Last data filed at 08/09/15 0412  Gross per 24 hour  Intake      0 ml  Output    700 ml  Net   -700 ml    LABS: Basic Metabolic Panel:  Recent Labs  40/98/11 1015 08/08/15 1853 08/09/15 0605  NA 128* 126*  --   K 6.2* 5.9*  --   CL 93* 92*  --   CO2 23 21*  --   GLUCOSE 214* 207*  --   BUN 69* 71*  --   CREATININE 3.95* 4.08*  --   CALCIUM 10.0 9.8  --   MG  --   --  1.8   Liver Function Tests: No results for input(s): AST, ALT, ALKPHOS, BILITOT, PROT, ALBUMIN in the last 72 hours. No results for input(s): LIPASE, AMYLASE in the last 72 hours. CBC:  Recent Labs  08/08/15 1853  WBC 5.1  HGB 10.1*  HCT 30.8*  MCV 93.1  PLT 226   Cardiac Enzymes:  Recent Labs  08/09/15 0141 08/09/15 0556  TROPONINI 0.20* 0.24*   BNP: Invalid input(s): POCBNP D-Dimer: No results for  input(s): DDIMER in the last 72 hours. Hemoglobin A1C: No results for input(s): HGBA1C in the last 72 hours. Fasting Lipid Panel: No results for input(s): CHOL, HDL, LDLCALC, TRIG, CHOLHDL, LDLDIRECT in the last 72 hours. Thyroid Function Tests: No results for input(s): TSH, T4TOTAL, T3FREE, THYROIDAB in the last 72 hours.  Invalid input(s): FREET3 Anemia Panel: No results for input(s): VITAMINB12, FOLATE, FERRITIN, TIBC, IRON, RETICCTPCT in the last 72 hours.  RADIOLOGY: Dg Chest 2 View  08/08/2015  CLINICAL DATA:  Weakness of bilateral lower extremity swelling for 2 days. EXAM: CHEST  2 VIEW COMPARISON:  Single-view of the chest 07/22/2015. FINDINGS: There is marked cardiomegaly. Interstitial pulmonary edema is identified and new since the prior exam. No pneumothorax or pleural effusion. The patient is status post CABG. Right PICC is unchanged. IMPRESSION: Interstitial pulmonary edema and marked cardiomegaly. Electronically Signed   By: Drusilla Kanner M.D.   On: 08/08/2015 18:14   Mr Abdomen Wo Contrast  07/26/2015  CLINICAL DATA:  75 year old female  inpatient with indeterminate right renal mass on renal ultrasound. EXAM: MRI ABDOMEN WITHOUT CONTRAST TECHNIQUE: Multiplanar multisequence MR imaging was performed without the administration of intravenous contrast. COMPARISON:  07/24/2015 renal sonogram . FINDINGS: Lower chest: Clear lung bases. Cardiomegaly. Susceptibility artifact from sternotomy wires. Hepatobiliary: Normal liver size and configuration. No hepatic steatosis. No liver mass. Focal adenomyomatosis in the fundus of the gallbladder. Otherwise normal gallbladder with no cholelithiasis. No biliary ductal dilatation. Common bile duct diameter 4 mm. No choledocholithiasis. Pancreas: No pancreatic mass or duct dilation.  No pancreas divisum. Spleen: Normal size. No mass. Adrenals/Urinary Tract: Normal adrenals. No hydronephrosis. There is a complex 3.6 x 3.2 x 3.5 cm cystic renal mass in  the lateral upper right kidney (series 13/ image 12), incompletely characterized on this noncontrast MRI, which demonstrates thickened internal septations, scattered internal subcentimeter nodular foci, and heterogeneous internal areas of restricted diffusion, suspicious for renal cell carcinoma. There are several similar-appearing homogeneous T1 hyperintense T2 hypointense renal cortical lesions in both kidneys, largest 1.1 cm in the posterior upper right kidney and 1.0 cm in the posterior upper left kidney, which are incompletely characterized on this noncontrast MRI, and are most suggestive of hemorrhagic/proteinaceous renal cysts. There is a simple appearing 0.9 cm renal cortical cyst in the mid to upper left kidney. Stomach/Bowel: Grossly normal stomach. Visualized small and large bowel is normal caliber, with no bowel wall thickening. Vascular/Lymphatic: Normal caliber abdominal aorta. No pathologically enlarged lymph nodes in the abdomen. Other: No abdominal ascites or focal fluid collection. Musculoskeletal: No aggressive appearing focal osseous lesions. IMPRESSION: 1. 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 Urology consultation advised. 2. Several small T1 hyperintense renal cortical lesions in both kidneys, incompletely characterized on this noncontrast MRI, most suggestive of hemorrhagic/proteinaceous renal cysts. Follow-up renal protocol MRI abdomen is recommended in 6 months to reassess these renal lesions, preferably without and with IV contrast if the patient's renal function has improved. 3. No noncontrast MRI evidence of metastatic disease in the abdomen. 4. Focal adenomyomatosis of the gallbladder fundus. Otherwise normal gallbladder, with no cholelithiasis. No biliary ductal dilatation. 5. Cardiomegaly. Electronically Signed   By: Delbert PhenixJason A Poff M.D.   On: 07/26/2015 11:44   [place] [facility]al  07/24/2015   CLINICAL DATA:  Acute renal failure. EXAM: RENAL / URINARY TRACT ULTRASOUND COMPLETE COMPARISON:  None. FINDINGS: Right Kidney: Length: 10.2 cm. There is a 3.2 x 3.2 x 3 cm mass in the mid right kidney. This does is not demonstrate the appearance of a simple cyst and may contain solid components. No hydronephrosis. Cortical thinning is identified. Left Kidney: Length: 10.1 cm.  Cortical thinning with no hydronephrosis. Bladder: Appears normal for degree of bladder distention. IMPRESSION: 1. There is a mass in the right kidney which does not have the appearance of a simple cyst and may contain solid components. This raises the possibility of a renal neoplasm. This should be further assessed with a contrast-enhanced CT scan or MRI. MRI would be the most sensitive and specific exam. Electronically Signed   By: Gerome Samavid  Williams III M.D   On: 07/24/2015 19:09   Dg Chest Port 1 View  07/22/2015  CLINICAL DATA:  Acute on chronic CHF, mitral and aortic insufficiency, coronary artery disease, former smoker, obesity. EXAM: PORTABLE CHEST 1 VIEW COMPARISON:  Chest x-ray of December 04, 2010 FINDINGS: The lungs are adequately inflated. There is no focal infiltrate and no pleural effusion. The  cardiac silhouette is enlarged. The pulmonary vascularity is engorged with mild cephalization observed. There are post CABG changes. There is calcification in the aortic arch. The PICC line tip projects over the midportion of the SVC. The observed bony thorax exhibits no acute abnormality. IMPRESSION: 1. CHF with mild pulmonary vascular congestion. There is no pneumonia. 2. Aortic atherosclerosis. Electronically Signed   By: David  [place] M.D.   On: 07/22/2015 07:33    PHYSICAL EXAM General: NAD Neck: JVP 16 cm, no thyromegaly or thyroid nodule.  Lungs: Slight dependent crackles.  CV: Nondisplaced PMI.  Heart regular S1/S2, no S3/S4, 1/6 SEM RUSB.  1+ edema to knees bilaterally.   Abdomen: Soft, nontender, no hepatosplenomegaly,  no distention.  Neurologic: Alert and oriented x 3.  Psych: Normal affect. Extremities: No clubbing or cyanosis.   TELEMETRY: Reviewed telemetry pt in NSR  ASSESSMENT AND PLAN: 75 yo with CAD, ischemic CMP, and CKD had recent admission with acute/chronic systolic CHF and ended up requiring home milrinone.  She was seen on 7/10 in clinic and noted to be volume overloaded.  Labs showed hyperkalemia and AKI so she was sent to the ER and subsequently admitted.  1. Acute on chronic systolic CHF: Ischemic cardiomyopathy. Most recent echo in 6/17 with EF 25-30% and moderately decreased RV systolic function. Medical titration limited by low BP and renal dysfunction. She was recently in the hospital and diuresed for acute/chronic systolic CHF. She required milrinone and we were unable to titrate her off. She was admitted 7/10 with AKI/hyperkalemia.  She remains markedly volume overloaded with CVP 22, co-ox 52% currently on milrinone 0.125.  Situation is very complicated by the combination of CHF and AKI. - Stop milrinone with rising creatinine and soft BP.  Will start dobutamine 3 mcg/kg/min and check co-ox.  - She is off Coreg with low output and not on ACEI/ARB/spironolactone with AKI on CKD. - Hold Bidil for now with relatively soft BP.  - Still volume overloaded with high CVP => Lasix 160 mg IV q8 hr for now, follow UOP. - Difficult situation.  Not candidate for LVAD with renal failure and RV failure.  She would be a poor long-term HD candidate given need for inotropes.  Not sure we are going to have a lot of good options for her.  If renal failure continues to be progressive, could envision short term CVVH trial but ultimately hospice/comfort care may be most appropriate.  2. CAD: s/p CABG. No chest pain since prior to admission at [facility] in 5/17. Given fall in EF, concerned for worsened CAD. Ideally, she would have coronary angiography. However, given lack of chest pain and elevated creatinine we  have held off. Continue ASA 81 and atorvastatin.  3. 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  I will ask renal to see him today.   35 minutes critical care time.   Marca Ancona 08/09/2015 7:38 AM

## 2015-08-09 NOTE — ED Notes (Signed)
Dr. Freida Busmanalton notified on pt.'s elevated Troponin as reported by lab tech.

## 2015-08-09 NOTE — Progress Notes (Signed)
TNA successful on pt's PICC line.  Blood returned noted on both lines of PICC.  Will continue to monitor closely and update as needed.

## 2015-08-09 NOTE — ED Notes (Signed)
Pts brief was wet and saturated when this RN removed it.

## 2015-08-09 NOTE — Progress Notes (Signed)
   PICC with difficulty drawing labs. IV team in the room placing alteplase.   Needs CO-OX BMET and ammonia now.    Amy Clegg NP-C

## 2015-08-09 NOTE — ED Notes (Addendum)
Pt ambulated to bedside commode.

## 2015-08-10 DIAGNOSIS — I5023 Acute on chronic systolic (congestive) heart failure: Secondary | ICD-10-CM

## 2015-08-10 DIAGNOSIS — Z66 Do not resuscitate: Secondary | ICD-10-CM

## 2015-08-10 DIAGNOSIS — Z515 Encounter for palliative care: Secondary | ICD-10-CM

## 2015-08-10 LAB — CBC
HCT: 30.1 % — ABNORMAL LOW (ref 36.0–46.0)
HEMOGLOBIN: 9.5 g/dL — AB (ref 12.0–15.0)
MCH: 29.7 pg (ref 26.0–34.0)
MCHC: 31.6 g/dL (ref 30.0–36.0)
MCV: 94.1 fL (ref 78.0–100.0)
PLATELETS: 201 10*3/uL (ref 150–400)
RBC: 3.2 MIL/uL — AB (ref 3.87–5.11)
RDW: 15.9 % — ABNORMAL HIGH (ref 11.5–15.5)
WBC: 3.7 10*3/uL — AB (ref 4.0–10.5)

## 2015-08-10 LAB — GLUCOSE, CAPILLARY
GLUCOSE-CAPILLARY: 126 mg/dL — AB (ref 65–99)
Glucose-Capillary: 242 mg/dL — ABNORMAL HIGH (ref 65–99)
Glucose-Capillary: 152 mg/dL — ABNORMAL HIGH (ref 65–99)
Glucose-Capillary: 375 mg/dL — ABNORMAL HIGH (ref 65–99)
Glucose-Capillary: 38 mg/dL — CL (ref 65–99)

## 2015-08-10 LAB — CARBOXYHEMOGLOBIN
CARBOXYHEMOGLOBIN: 1 % (ref 0.5–1.5)
Carboxyhemoglobin: 1.2 % (ref 0.5–1.5)
METHEMOGLOBIN: 0.6 % (ref 0.0–1.5)
METHEMOGLOBIN: 1 % (ref 0.0–1.5)
O2 SAT: 48.6 %
O2 Saturation: 37.3 %
TOTAL HEMOGLOBIN: 16.2 g/dL — AB (ref 12.0–16.0)
TOTAL HEMOGLOBIN: 8.5 g/dL — AB (ref 12.0–16.0)

## 2015-08-10 LAB — COMPREHENSIVE METABOLIC PANEL
ALT: 38 U/L (ref 14–54)
ANION GAP: 14 (ref 5–15)
AST: 22 U/L (ref 15–41)
Albumin: 3.3 g/dL — ABNORMAL LOW (ref 3.5–5.0)
Alkaline Phosphatase: 85 U/L (ref 38–126)
BUN: 67 mg/dL — ABNORMAL HIGH (ref 6–20)
CALCIUM: 9.6 mg/dL (ref 8.9–10.3)
CHLORIDE: 91 mmol/L — AB (ref 101–111)
CO2: 25 mmol/L (ref 22–32)
CREATININE: 3.49 mg/dL — AB (ref 0.44–1.00)
GFR, EST AFRICAN AMERICAN: 14 mL/min — AB (ref >60)
GFR, EST NON AFRICAN AMERICAN: 12 mL/min — AB (ref >60)
Glucose, Bld: 191 mg/dL — ABNORMAL HIGH (ref 65–99)
Potassium: 3.9 mmol/L (ref 3.5–5.1)
SODIUM: 130 mmol/L — AB (ref 135–145)
Total Bilirubin: 1.3 mg/dL — ABNORMAL HIGH (ref 0.3–1.2)
Total Protein: 7 g/dL (ref 6.5–8.1)

## 2015-08-10 MED ORDER — FENTANYL CITRATE (PF) 100 MCG/2ML IJ SOLN: 12.5000 ug | INTRAMUSCULAR | Status: DC | PRN

## 2015-08-10 NOTE — Care Management Important Message (Signed)
Important Message  Patient Details  Name: [name] [name] MRN: [id number] Date of Birth: 06/17/1940   Medicare Important Message Given:  Yes    Bernadette HoitShoffner, Lurdes Haltiwanger Coleman 08/10/2015, 8:39 AM

## 2015-08-10 NOTE — Progress Notes (Signed)
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   Subjective:  GFR stable, ok UOP. On NE + Dobut; in better spirtis this AM Daughters at bedside Discussed big picture  MvO2 remains low  07/11 0701 - 07/12 0700 In: 1187.1 [P.O.:720; I.V.:269.1; IV Piggyback:198] Out: 870 [Urine:870]  Filed Weights   08/09/15 0551 08/10/15 0300  Weight: 91.2 kg (201 lb 1 oz) 91.4 kg (201 lb 8 oz)    Scheduled Meds: . aspirin EC  81 mg Oral Daily  . atorvastatin  40 mg Oral q1800  . cholecalciferol  5,000 Units Oral Daily  . famotidine  20 mg Oral Daily  . furosemide  160 mg Intravenous Q8H  . heparin subcutaneous  5,000 Units Subcutaneous Q8H  . insulin aspart  0-15 Units Subcutaneous TID WC  . insulin aspart protamine- aspart  25 Units Subcutaneous BID WC  . sodium chloride flush  10-40 mL Intracatheter Q12H  . sodium chloride flush  3 mL Intravenous Q12H   Continuous Infusions: . DOBUTamine 5 mcg/kg/min (08/10/15 0400)  . norepinephrine (LEVOPHED) Adult infusion 6 mcg/min (08/10/15 0730)   PRN Meds:.sodium chloride, acetaminophen, ALPRAZolam, ondansetron (ZOFRAN) IV, sodium chloride flush, sodium chloride flush, zolpidem  Current Labs: reviewed    Physical Exam:  Blood pressure 100/69, pulse 98, temperature 97.3 F (36.3 C), temperature source Oral, resp. rate 18, height 5\' 2"  (1.575 m), weight 91.4 kg (201 lb 8 oz), SpO2 100 %. GEN: frail, elderly female, falls asleep in room ENT: NCAT EYES: EOMI CV: RRR PULM: bibasilar crackles ABD: s/nt/nd SKIN: no rashes/lesiosn EXT:2+ LEE   Assessment   1. 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 Decompensated sCHF 2/2 ICM on dobutamine gtt 3. Hypervolemia on bolus lasix  Plan 1. Palliative care has been consulted 2. Don't oppose CRRT just don't think we should employ yet, see how palliative meeing goes reassess in AM   [facility]iiospine OrlandoRyan Ringo Sherod MD 08/10/2015, 12:02 PM   Recent  Labs Lab 08/09/15 0932 08/09/15 1403 08/10/15 0330  NA 128* 131*  129* 130*  K 4.2 4.3  4.2 3.9  CL 95* 94*  94* 91*  CO2 22 22  21* 25  GLUCOSE 191* 225*  227* 191*  BUN 67* 67*  67* 67*  CREATININE 3.56* 3.51*  3.52* 3.49*  CALCIUM 9.9 9.9  9.8 9.6  PHOS  --  6.1*  --     Recent Labs Lab 08/08/15 1853 08/10/15 0330  WBC 5.1 3.7*  HGB 10.1* 9.5*  HCT 30.8* 30.1*  MCV 93.1 94.1  PLT 226 201

## 2015-08-10 NOTE — Progress Notes (Signed)
Patient ID: [name] [name], female   DOB: May 16, 1940, 75 y.o.   MRN: [id number]    SUBJECTIVE:   Feels better. Denies SOB.  SBP in 80s on norepinephrine @ 6 and dobutamine @ 5.  Co-ox 49%, CVP 18.  870 cc UOP. Creatinine stable at 3.49.    Scheduled Meds: . aspirin EC  81 mg Oral Daily  . atorvastatin  40 mg Oral q1800  . cholecalciferol  5,000 Units Oral Daily  . famotidine  20 mg Oral Daily  . furosemide  160 mg Intravenous Q8H  . heparin subcutaneous  5,000 Units Subcutaneous Q8H  . insulin aspart  0-15 Units Subcutaneous TID WC  . insulin aspart protamine- aspart  25 Units Subcutaneous BID WC  . sodium chloride flush  10-40 mL Intracatheter Q12H  . sodium chloride flush  3 mL Intravenous Q12H   Continuous Infusions: . DOBUTamine 5 mcg/kg/min (08/10/15 0400)  . norepinephrine (LEVOPHED) Adult infusion 6 mcg/min (08/10/15 0520)   PRN Meds:.sodium chloride, acetaminophen, ALPRAZolam, ondansetron (ZOFRAN) IV, sodium chloride flush, sodium chloride flush, zolpidem    Filed Vitals:   08/10/15 0522 08/10/15 0530 08/10/15 0600 08/10/15 0630  BP: 90/58 82/64 87/63  87/59  Pulse: 97 95 98 98  Temp:      TempSrc:      Resp: 17 22 19 17   Height:      Weight:      SpO2: 100% 100% 97% 100%    Intake/Output Summary (Last 24 hours) at 08/10/15 0715 Last data filed at 08/10/15 0700  Gross per 24 hour  Intake 1187.14 ml  Output    870 ml  Net 317.14 ml    LABS: Basic Metabolic Panel:  Recent Labs  40/98/11 0605  08/09/15 1403 08/10/15 0330  NA  --   < > 131*  129* 130*  K  --   < > 4.3  4.2 3.9  CL  --   < > 94*  94* 91*  CO2  --   < > 22  21* 25  GLUCOSE  --   < > 225*  227* 191*  BUN  --   < > 67*  67* 67*  CREATININE  --   < > 3.51*  3.52* 3.49*  CALCIUM  --   < > 9.9  9.8 9.6  MG 1.8  --   --   --   PHOS  --   --  6.1*  --   < > = values in this interval not displayed. Liver Function Tests:  Recent Labs  08/09/15 1403 08/10/15 0330  AST  --  22    ALT  --  38  ALKPHOS  --  85  BILITOT  --  1.3*  PROT  --  7.0  ALBUMIN 3.2* 3.3*   No results for input(s): LIPASE, AMYLASE in the last 72 hours. CBC:  Recent Labs  08/08/15 1853 08/10/15 0330  WBC 5.1 3.7*  HGB 10.1* 9.5*  HCT 30.8* 30.1*  MCV 93.1 94.1  PLT 226 201   Cardiac Enzymes:  Recent Labs  08/09/15 0141 08/09/15 0556 08/09/15 1403  TROPONINI 0.20* 0.24* 0.17*   BNP: Invalid input(s): POCBNP D-Dimer: No results for input(s): DDIMER in the last 72 hours. Hemoglobin A1C: No results for input(s): HGBA1C in the last 72 hours. Fasting Lipid Panel: No results for input(s): CHOL, HDL, LDLCALC, TRIG, CHOLHDL, LDLDIRECT in the last 72 hours. Thyroid Function Tests: No results for input(s): TSH, T4TOTAL, T3FREE, THYROIDAB in the  last 72 hours.  Invalid input(s): FREET3 Anemia Panel: No results for input(s): VITAMINB12, FOLATE, FERRITIN, TIBC, IRON, RETICCTPCT in the last 72 hours.  RADIOLOGY: Dg Chest 2 View  08/08/2015  CLINICAL DATA:  Weakness of bilateral lower extremity swelling for 2 days. EXAM: CHEST  2 VIEW COMPARISON:  Single-view of the chest 07/22/2015. FINDINGS: There is marked cardiomegaly. Interstitial pulmonary edema is identified and new since the prior exam. No pneumothorax or pleural effusion. The patient is status post CABG. Right PICC is unchanged. IMPRESSION: Interstitial pulmonary edema and marked cardiomegaly. Electronically Signed   By: Drusilla Kanner M.D.   On: 08/08/2015 18:14   Mr Abdomen Wo Contrast  07/26/2015  CLINICAL DATA:  75 year old female inpatient with indeterminate right renal mass on renal ultrasound. EXAM: MRI ABDOMEN WITHOUT CONTRAST TECHNIQUE: Multiplanar multisequence MR imaging was performed without the administration of intravenous contrast. COMPARISON:  07/24/2015 renal sonogram . FINDINGS: Lower chest: Clear lung bases. Cardiomegaly. Susceptibility artifact from sternotomy wires. Hepatobiliary: Normal liver size and  configuration. No hepatic steatosis. No liver mass. Focal adenomyomatosis in the fundus of the gallbladder. Otherwise normal gallbladder with no cholelithiasis. No biliary ductal dilatation. Common bile duct diameter 4 mm. No choledocholithiasis. Pancreas: No pancreatic mass or duct dilation.  No pancreas divisum. Spleen: Normal size. No mass. Adrenals/Urinary Tract: Normal adrenals. No hydronephrosis. There is a complex 3.6 x 3.2 x 3.5 cm cystic renal mass in the lateral upper right kidney (series 13/ image 12), incompletely characterized on this noncontrast MRI, which demonstrates thickened internal septations, scattered internal subcentimeter nodular foci, and heterogeneous internal areas of restricted diffusion, suspicious for renal cell carcinoma. There are several similar-appearing homogeneous T1 hyperintense T2 hypointense renal cortical lesions in both kidneys, largest 1.1 cm in the posterior upper right kidney and 1.0 cm in the posterior upper left kidney, which are incompletely characterized on this noncontrast MRI, and are most suggestive of hemorrhagic/proteinaceous renal cysts. There is a simple appearing 0.9 cm renal cortical cyst in the mid to upper left kidney. Stomach/Bowel: Grossly normal stomach. Visualized small and large bowel is normal caliber, with no bowel wall thickening. Vascular/Lymphatic: Normal caliber abdominal aorta. No pathologically enlarged lymph nodes in the abdomen. Other: No abdominal ascites or focal fluid collection. Musculoskeletal: No aggressive appearing focal osseous lesions. IMPRESSION: 1. 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 Urology consultation advised. 2. Several small T1 hyperintense renal cortical lesions in both kidneys, incompletely characterized on this noncontrast MRI, most suggestive of hemorrhagic/proteinaceous renal cysts. Follow-up renal protocol MRI abdomen is  recommended in 6 months to reassess these renal lesions, preferably without and with IV contrast if the patient's renal function has improved. 3. No noncontrast MRI evidence of metastatic disease in the abdomen. 4. Focal adenomyomatosis of the gallbladder fundus. Otherwise normal gallbladder, with no cholelithiasis. No biliary ductal dilatation. 5. Cardiomegaly. Electronically Signed   By: Delbert Phenix M.D.   On: 07/26/2015 11:44   US Renal  07/24/2015  CLINICAL DATA:  Acute renal failure. EXAM: RENAL / URINARY TRACT ULTRASOUND COMPLETE COMPARISON:  None. FINDINGS: Right Kidney: Length: 10.2 cm. There is a 3.2 x 3.2 x 3 cm mass in the mid right kidney. This does is not demonstrate the appearance of a simple cyst and may contain solid components. No hydronephrosis. Cortical thinning is identified. Left Kidney: Length: 10.1 cm.  Cortical thinning with no hydronephrosis. Bladder: Appears normal for degree of bladder distention. IMPRESSION: 1.  There is a mass in the right kidney which does not have the appearance of a simple cyst and may contain solid components. This raises the possibility of a renal neoplasm. This should be further assessed with a contrast-enhanced CT scan or MRI. MRI would be the most sensitive and specific exam. Electronically Signed   By: Gerome Samavid  Williams III M.D   On: 07/24/2015 19:09   Dg Chest Port 1 View  07/22/2015  CLINICAL DATA:  Acute on chronic CHF, mitral and aortic insufficiency, coronary artery disease, former smoker, obesity. EXAM: PORTABLE CHEST 1 VIEW COMPARISON:  Chest x-ray of December 04, 2010 FINDINGS: The lungs are adequately inflated. There is no focal infiltrate and no pleural effusion. The cardiac silhouette is enlarged. The pulmonary vascularity is engorged with mild cephalization observed. There are post CABG changes. There is calcification in the aortic arch. The PICC line tip projects over the midportion of the SVC. The observed bony thorax exhibits no acute  abnormality. IMPRESSION: 1. CHF with mild pulmonary vascular congestion. There is no pneumonia. 2. Aortic atherosclerosis. Electronically Signed   By: David  SwazilandJordan M.D.   On: 07/22/2015 07:33    PHYSICAL EXAM CVP 18  General: NAD Neck: JVP to jaw , no thyromegaly or thyroid nodule.  Lungs: Slight dependent crackles.  CV: Nondisplaced PMI.  Heart regular S1/S2, no S3/S4, 1/6 SEM RUSB.  R and LLE 1+ edema to knees bilaterally.   Abdomen: Soft, nontender, no hepatosplenomegaly, no distention.  Neurologic: Alert and oriented x 3.  Psych: Normal affect. Extremities: No clubbing or cyanosis.   TELEMETRY: Reviewed telemetry pt in NSR  ASSESSMENT AND PLAN: 75 yo with CAD, ischemic CMP, and CKD had recent admission with acute/chronic systolic CHF and ended up requiring home milrinone.  She was seen on 7/10 in clinic and noted to be volume overloaded.  Labs showed hyperkalemia and AKI so she was sent to the ER and subsequently admitted.  1. Acute on chronic systolic CHF: Ischemic cardiomyopathy. Most recent echo in 6/17 with EF 25-30% and moderately decreased RV systolic function. Medical titration limited by low BP and renal dysfunction. She was recently in the hospital and diuresed for acute/chronic systolic CHF. She required milrinone and we were unable to titrate her off. She was admitted 7/10 with AKI/hyperkalemia.  She remains markedly volume overloaded with CVP 18,  co-ox 49% currently on dobutamine 5 mcg + norepi 6 mcg.  Situation is very complicated by the combination of CHF and AKI.  - Off bb and Bidil with hypotension.   - Still volume overloaded with high CVP => Lasix 160 mg IV q8 hr for now, follow UOP. - Difficult situation.  Not candidate for LVAD with renal failure and RV failure.  She would be a poor long-term HD candidate given need for inotropes.  Not sure we are going to have a lot of good options for her.  If renal failure continues to be progressive, could envision short term  CVVH trial but ultimately hospice/comfort care may be most appropriate.  2. CAD: s/p CABG. No chest pain [facility]. Given fall in EF, concerned for worsened CAD. Ideally, she would have coronary angiography. However, given lack of chest pain and elevated creatinine we have held off. Continue ASA 81 and atorvastatin.  3. 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  Renal following, still inadequate UOP.  If we continue aggressive support, think we are nearing  need for CVVH.  BP, however, may limit.    Amy Clegg NP-C  08/10/2015 7:15 AM   Patient seen with NP, agree with the above note.  Creatinine has stabilized but co-ox remains low at 49% and CVP high.  She is requiring more hemodynamic support. UOP remains relatively poor.   - SBP in 80s, will titrate up norepinephrine and aim for SBP 90 or above.  Continue dobutamine and repeat co-ox later this morning.  - Continue high dose IV Lasix boluses for now.  - If we proceed with aggressive care, I think that she is nearing the need for a CVVH trial.  I discussed situation with patient and daughter today.  I think that her long-term prognosis is looking poor.  Long-term HD is not going to be a viable option.  She is not going to be an LVAD candidate.  Probably the best we can hope for is a CVVH trial to unload fluid and hope renal function recovers on its own, but with her soft blood pressure this is looking more difficult.  Will await renal input today.  Also discussed option of comfort care/hospice.  Family/patient would like to meet with palliative care service, will make consult.   40 minutes critical care time.  Marca Ancona 08/10/2015 7:41 AM

## 2015-08-10 NOTE — Progress Notes (Signed)
Hypoglycemic Event  CBG: 38  Treatment: 15 GM carbohydrate snack  Symptoms: None  Follow-up CBG: Time:2228 CBG Result:375  Possible Reasons for Event: Inadequate meal intake  Comments/MD notified:no    [name] [name], [name] [name]

## 2015-08-10 NOTE — Progress Notes (Signed)
Pharmacist Heart Failure Core Measure Documentation  Assessment: [name] [name] has an EF documented as 25-30% on 07/22/15 by ECHO.  Rationale: Heart failure patients with left ventricular systolic dysfunction (LVSD) and an EF < 40% should be prescribed an angiotensin converting enzyme inhibitor (ACEI) or angiotensin receptor blocker (ARB) at discharge unless a contraindication is documented in the medical record.  This patient is not currently on an ACEI or ARB for HF.  This note is being placed in the record in order to provide documentation that a contraindication to the use of these agents is present for this encounter.  ACE Inhibitor or Angiotensin Receptor Blocker is contraindicated (specify all that apply)  []   ACEI allergy AND ARB allergy []   Angioedema []   Moderate or severe aortic stenosis []   Hyperkalemia [x]   Hypotension []   Renal artery stenosis [x]   Worsening renal function, preexisting renal disease or dysfunction   Fredrik RiggerMarkle, Krystl Wickware Sue 08/10/2015 11:39 AM

## 2015-08-10 NOTE — Consult Note (Signed)
Consultation Note Date: 08/10/2015   Patient Name: [name] [name]  DOB: [birth year]  MRN: [id number]  Age / Sex: 75 y.o., female  PCP: No primary care provider on file. Referring Physician: Larey Dresser, MD  Reason for Consultation: Establishing goals of care  HPI/Patient Profile: 75 y.o. female  with past medical history of CAD, ischemic CMP, and CKD had recent admission with acute/chronic systolic CHF and ended up requiring home milrinone. She was seen on 08/08/2015 in clinic and noted to be volume overloaded. Labs showed hyperkalemia and AKI so she was sent to the ER and subsequently admitted. She is requiring levophed and dobutamine but SVO2 remains very low and UOP remains low. Unfortunately she has not responded well to medical treatment and fear of cardiorenal and may be approaching EOL.   Clinical Assessment and Goals of Care: I met today with Ms. Seales along with her husband, 4 of 5 daughters, and her brother at bedside. We had a long discussion regarding her lack of improvement and limited options for continued improvement. Ms. Cino and her family are very understanding and very aware that she may be approaching EOL. Ms. Scherger tells me that her main concern is to be comfortable and not suffer and family is supportive of anything she decides and wants. We discussed CVVH, comfort care, hospice options (facility vs home), hospital death, as well as code status. Ms. Slape does express that she desires DNR. She would like to continue current treatment and looks to Dr. Aundra Dubin for recommendations for titrating off medications if not working or benefiting her. Also does not make a final decision but they seem accepting if CVVH is not recommended. I will continue to follow and continue conversations as well.   They are all understandably tearful but rely on their faith to support themselves. Mr. Zwiefelhofer is  particularly struggling and talks about how God can still heal her. I verified his feelings by explaining that unfortunately our conversations focus on poor outcomes because there is not much to discuss with improvement. All questions/concerns addressed. DNR placed. Medications for comfort added to ensure comfort (although no complaints or discomfort currently). I will follow up tomorrow morning.     SUMMARY OF RECOMMENDATIONS   - DNR placed - Comfort is priority - Continue current treatments as indicated  Code Status/Advance Care Planning:  DNR   Symptom Management:   Pain/dyspnea: Fentanyl 12.5 mcg every 2 hours prn.   Palliative Prophylaxis:   Delirium Protocol and Frequent Pain Assessment  Psycho-social/Spiritual:   Desire for further Chaplaincy support:yes  Additional Recommendations: Caregiving  Support/Resources, Education on Hospice and Grief/Bereavement Support  Prognosis:   < 2 weeks  Discharge Planning: To Be Determined      Primary Diagnoses: Present on Admission:  . Acute on chronic systolic CHF (congestive heart failure) ([place]) . CAD, ARTERY BYPASS GRAFT . Cardiomyopathy, ischemic . Acute on chronic renal failure ([place]) . Hyperkalemia . CKD (chronic kidney disease), stage III . MITRAL REGURG W/ AORTIC INSUFF, RHEUM/NON-RHEUM . Overweight . Acute  on chronic systolic and diastolic heart failure, NYHA class 4 ([place])  I have reviewed the medical record, interviewed the patient and family, and examined the patient. The following aspects are pertinent.  Past Medical History  Diagnosis Date  . Obesity   . Essential hypertension   . Hyperlipidemia   . Mitral valve insufficiency and aortic valve insufficiency     a. Mild-mod MR by echo 07/2014.  Marland Kitchen CAD (coronary artery disease)     a. s/p 3V CABG 2002 (SVG-LAD, SVG-OM, SVG-PDA).  . Diabetes mellitus ([place])   . Chronic systolic CHF (congestive heart failure) ([facility])     a. EF 40%.  . Ischemic cardiomyopathy     . Sinus bradycardia   . Hypotension    Social History   Social History  . Marital Status: Married    Spouse Name: OBEDIAH  . Number of Children: N/A  . Years of Education: N/A   Occupational History  . RETIRED    Social History Main Topics  . Smoking status: Former Smoker -- 1.50 packs/day for 15 years    Types: Cigarettes    Start date: 08/03/1981    Quit date: 01/30/1996  . Smokeless tobacco: Never Used  . Alcohol Use: No  . Drug Use: No  . Sexual Activity: Not Asked   Other Topics Concern  . None   Social History Narrative   Family History  Problem Relation Age of Onset  . Hypertension     Scheduled Meds: . aspirin EC  81 mg Oral Daily  . atorvastatin  40 mg Oral q1800  . cholecalciferol  5,000 Units Oral Daily  . famotidine  20 mg Oral Daily  . furosemide  160 mg Intravenous Q8H  . heparin subcutaneous  5,000 Units Subcutaneous Q8H  . insulin aspart  0-15 Units Subcutaneous TID WC  . insulin aspart protamine- aspart  25 Units Subcutaneous BID WC  . sodium chloride flush  10-40 mL Intracatheter Q12H  . sodium chloride flush  3 mL Intravenous Q12H   Continuous Infusions: . DOBUTamine 5 mcg/kg/min (08/10/15 0400)  . norepinephrine (LEVOPHED) Adult infusion 6 mcg/min (08/10/15 0730)   PRN Meds:.sodium chloride, acetaminophen, ALPRAZolam, fentaNYL (SUBLIMAZE) injection, ondansetron (ZOFRAN) IV, sodium chloride flush, sodium chloride flush, zolpidem Medications Prior to Admission:  Prior to Admission medications   Medication Sig Start Date End Date Taking? Authorizing Provider  aspirin 81 MG tablet Take 81 mg by mouth daily.     Yes Historical Provider, MD  atorvastatin (LIPITOR) 40 MG tablet Take 1 tablet by mouth  daily Patient taking differently: Take 1 tablet by mouth daily in evening 04/05/15  Yes Arnoldo Lenis, MD  Cholecalciferol (D-3-5) 5000 UNITS capsule Take 5,000 Units by mouth daily.   Yes Historical Provider, MD  HUMALOG MIX 75/25 (75-25) 100  UNIT/ML SUSP injection Inject 25 Units into the skin 2 (two) times daily.  03/12/13  Yes Historical Provider, MD  isosorbide-hydrALAZINE (BIDIL) 20-37.5 MG tablet Take 1 tablet by mouth 3 (three) times daily. Patient taking differently: Take 0.5 tablets by mouth 3 (three) times daily.  08/08/15  Yes Larey Dresser, MD  metolazone (ZAROXOLYN) 2.5 MG tablet Take 1 tablet (2.5 mg total) by mouth 2 (two) times a week. On Tuesday and Fridays with an additional 40 meq of potassium. 08/10/15  Yes Larey Dresser, MD  milrinone [facility]) 20 MG/100 ML SOLN infusion Inject 22.625 mcg/min into the vein continuous. 08/03/15  Yes Shirley Friar, PA-C  nitroGLYCERIN (NITROSTAT) 0.4 MG  SL tablet Place 1 tablet (0.4 mg total) under the tongue every 5 (five) minutes as needed. Patient taking differently: Place 0.4 mg under the tongue every 5 (five) minutes as needed for chest pain.  09/09/14  Yes Dayna N Dunn, PA-C  Omega-3 Fatty Acids (FISH OIL) 1200 MG CAPS Take 1,200-2,400 mg by mouth 2 (two) times daily. Take 2 tabs by mouth every morning & 1 tab every evening   Yes Historical Provider, MD  potassium chloride SA (K-DUR,KLOR-CON) 20 MEQ tablet Take 2 tablets (40 mEq total) by mouth daily. Take an extra 2 tablets (40 meq total) on days you take metolazone. 08/03/15  Yes Shirley Friar, PA-C  ranitidine (ZANTAC) 150 MG tablet Take 1 tablet by mouth two  times daily 03/18/15  Yes Arnoldo Lenis, MD  spironolactone (ALDACTONE) 25 MG tablet Take 0.5 tablets (12.5 mg total) by mouth daily. 08/03/15  Yes Shirley Friar, PA-C  torsemide (DEMADEX) 20 MG tablet Take 4 tablets (80 mg total) by mouth 2 (two) times daily. 08/03/15  Yes Shirley Friar, PA-C   Allergies  Allergen Reactions  . Spironolactone Other (See Comments)    Not allergic but had worsening of renal function while on this.  . Sulfonamide Derivatives Other (See Comments)    REACTION: stomach upset   Review of Systems    Constitutional: Positive for activity change, appetite change and fatigue.  Neurological: Positive for weakness.    Physical Exam  Constitutional: She is oriented to person, place, and time. She appears well-developed and well-nourished.  HENT:  Head: Normocephalic and atraumatic.  Cardiovascular: Normal rate.   Pulmonary/Chest: Effort normal. No accessory muscle usage. No tachypnea. No respiratory distress.  Room air   Abdominal: Soft. Normal appearance.  Neurological: She is alert and oriented to person, place, and time.    Vital Signs: BP 89/67 mmHg  Pulse 95  Temp(Src) 97.4 F (36.3 C) (Oral)  Resp 22  Ht '5\' 2"'$  (1.575 m)  Wt 91.4 kg (201 lb 8 oz)  BMI 36.85 kg/m2  SpO2 100% Pain Assessment: No/denies pain   Pain Score: 0-No pain   SpO2: SpO2: 100 % O2 Device:SpO2: 100 % O2 Flow Rate: .   IO: Intake/output summary:  Intake/Output Summary (Last 24 hours) at 08/10/15 1707 Last data filed at 08/10/15 1500  Gross per 24 hour  Intake  853.9 ml  Output    350 ml  Net  503.9 ml    LBM: Last BM Date: 08/10/15 Baseline Weight: Weight: 91.2 kg (201 lb 1 oz) Most recent weight: Weight: 91.4 kg (201 lb 8 oz)     Palliative Assessment/Data:   Flowsheet Rows        Most Recent Value   Intake Tab    Referral Department  Cardiology   Unit at Time of Referral  ICU   Palliative Care Primary Diagnosis  Cardiac   Date Notified  08/10/15   Palliative Care Type  New Palliative care   Reason for referral  Clarify Goals of Care   Date of Admission  08/08/15   # of days IP prior to Palliative referral  2   Clinical Assessment    Psychosocial & Spiritual Assessment    Palliative Care Outcomes       Time In: 1600 Time Out: 1710 Time Total: 68mn Greater than 50%  of this time was spent counseling and coordinating care related to the above assessment and plan.  Signed by: PPershing Proud NP  Please contact Palliative Medicine Team phone at [phone] for questions  and concerns.  For individual provider: See Shea Evans

## 2015-08-10 NOTE — Progress Notes (Signed)
Inpatient Diabetes Program Recommendations  AACE/ADA: New Consensus Statement on Inpatient Glycemic Control (2015)  Target Ranges:  Prepandial:   less than 140 mg/dL      Peak postprandial:   less than 180 mg/dL (1-2 hours)      Critically ill patients:  140 - 180 mg/dL   Lab Results  Component Value Date   GLUCAP 242* 08/10/2015    Review of Glycemic Control:  Results for Harriett SineSCALES, Kamaria C (MRN [id number]) as of 08/10/2015 11:19  Ref. Range 08/08/2015 18:53 08/09/2015 09:32 08/09/2015 14:03 08/09/2015 14:03 08/10/2015 03:30  Glucose Latest Ref Range: 65-99 mg/dL 409207 (H) 811191 (H) 914227 (H) 225 (H) 191 (H)   Inpatient Diabetes Program Recommendations:    Please consider increasing Novolog 70/30 to 30 units bid.    Thanks, Beryl MeagerJenny Adreana Coull, RN, BC-ADM Inpatient Diabetes Coordinator Pager [phone] (8a-5p)

## 2015-08-10 NOTE — Progress Notes (Signed)
   08/10/15 1000  Clinical Encounter Type  Visited With Patient and family together  Visit Type Other (Comment) (AD)  Referral From Nurse  Consult/Referral To Chaplain  Patient has AD ready but daughter has it and will bring back this afternoon for notary. Rodney BoozeGail L Gaynor Ferreras 08/10/2015

## 2015-08-10 NOTE — Progress Notes (Signed)
Pt BP at 0500 high 70s/30s map 50-52. BP cuff rotated for accuracy. Pt awakened easily and oriented. MD Shirlee LatchMcLean notified of BP with orders to titrate Levo and Dobutamine as necessary. MD Shirlee LatchMcLean also updated on am Co-ox 48.6 and urine output 350/shift. Bladderscan 61cc after void. Will continue to monitor pt. Reinette Cuneo L

## 2015-08-11 LAB — CARBOXYHEMOGLOBIN
CARBOXYHEMOGLOBIN: 1.1 % (ref 0.5–1.5)
Methemoglobin: 1 % (ref 0.0–1.5)
O2 SAT: 64.9 %
Total hemoglobin: 10 g/dL — ABNORMAL LOW (ref 12.0–16.0)

## 2015-08-11 LAB — CBC
HCT: 30 % — ABNORMAL LOW (ref 36.0–46.0)
Hemoglobin: 9.7 g/dL — ABNORMAL LOW (ref 12.0–15.0)
MCH: 30.1 pg (ref 26.0–34.0)
MCHC: 32.3 g/dL (ref 30.0–36.0)
MCV: 93.2 fL (ref 78.0–100.0)
PLATELETS: 189 10*3/uL (ref 150–400)
RBC: 3.22 MIL/uL — AB (ref 3.87–5.11)
RDW: 16 % — ABNORMAL HIGH (ref 11.5–15.5)
WBC: 4.2 10*3/uL (ref 4.0–10.5)

## 2015-08-11 LAB — GLUCOSE, CAPILLARY
GLUCOSE-CAPILLARY: 100 mg/dL — AB (ref 65–99)
Glucose-Capillary: 123 mg/dL — ABNORMAL HIGH (ref 65–99)
Glucose-Capillary: 161 mg/dL — ABNORMAL HIGH (ref 65–99)
Glucose-Capillary: 140 mg/dL — ABNORMAL HIGH (ref 65–99)
Glucose-Capillary: 59 mg/dL — ABNORMAL LOW (ref 65–99)
Glucose-Capillary: 131 mg/dL — ABNORMAL HIGH (ref 65–99)

## 2015-08-11 LAB — BASIC METABOLIC PANEL
Anion gap: 13 (ref 5–15)
BUN: 68 mg/dL — ABNORMAL HIGH (ref 6–20)
CHLORIDE: 92 mmol/L — AB (ref 101–111)
CO2: 24 mmol/L (ref 22–32)
CREATININE: 3.44 mg/dL — AB (ref 0.44–1.00)
Calcium: 9.3 mg/dL (ref 8.9–10.3)
GFR calc non Af Amer: 12 mL/min — ABNORMAL LOW (ref >60)
GFR, EST AFRICAN AMERICAN: 14 mL/min — AB (ref >60)
GLUCOSE: 143 mg/dL — AB (ref 65–99)
Potassium: 3.6 mmol/L (ref 3.5–5.1)
Sodium: 129 mmol/L — ABNORMAL LOW (ref 135–145)

## 2015-08-11 LAB — GRAM STAIN

## 2015-08-11 MED ORDER — PROMETHAZINE HCL 25 MG PO TABS
12.5000 mg | ORAL_TABLET | Freq: Once | ORAL | Status: AC
  Administered 2015-08-11: 12.5 mg via ORAL
  Filled 2015-08-11: qty 1

## 2015-08-11 MED ORDER — METOLAZONE 5 MG PO TABS
5.0000 mg | ORAL_TABLET | Freq: Two times a day (BID) | ORAL | Status: DC
  Administered 2015-08-11 – 2015-08-16 (×10): 5 mg via ORAL
  Filled 2015-08-11 (×12): qty 1

## 2015-08-11 MED ORDER — POTASSIUM CHLORIDE CRYS ER 20 MEQ PO TBCR
20.0000 meq | EXTENDED_RELEASE_TABLET | Freq: Once | ORAL | Status: AC
  Administered 2015-08-11: 20 meq via ORAL
  Filled 2015-08-11: qty 1

## 2015-08-11 NOTE — Progress Notes (Signed)
Patient ID: [name] [name], female   DOB: 01-14-1941, 75 y.o.   MRN: [id number]    SUBJECTIVE:   No dyspnea at rest.  SBP in 90s-100s on norepinephrine @ 6 and dobutamine @ 5.  Co-ox 65%, CVP 18.  600 cc UOP. Creatinine stable at 3.44.    Met with palliative care on 7/12, now DNR.    Scheduled Meds: . aspirin EC  81 mg Oral Daily  . atorvastatin  40 mg Oral q1800  . cholecalciferol  5,000 Units Oral Daily  . famotidine  20 mg Oral Daily  . furosemide  160 mg Intravenous Q8H  . heparin subcutaneous  5,000 Units Subcutaneous Q8H  . insulin aspart  0-15 Units Subcutaneous TID WC  . insulin aspart protamine- aspart  25 Units Subcutaneous BID WC  . metolazone  5 mg Oral BID  . potassium chloride  20 mEq Oral Once  . sodium chloride flush  10-40 mL Intracatheter Q12H  . sodium chloride flush  3 mL Intravenous Q12H   Continuous Infusions: . DOBUTamine 5 mcg/kg/min (08/11/15 0700)  . norepinephrine (LEVOPHED) Adult infusion 5.973 mcg/min (08/11/15 0700)   PRN Meds:.sodium chloride, acetaminophen, ALPRAZolam, fentaNYL (SUBLIMAZE) injection, ondansetron (ZOFRAN) IV, sodium chloride flush, sodium chloride flush, zolpidem    Filed Vitals:   08/11/15 0619 08/11/15 0630 08/11/15 0634 08/11/15 0700  BP:  107/71  101/67  Pulse:   96 94  Temp:      TempSrc:      Resp:   17 20  Height:      Weight: 201 lb 1 oz (91.2 kg)     SpO2:   100% 100%    Intake/Output Summary (Last 24 hours) at 08/11/15 0732 Last data filed at 08/11/15 0700  Gross per 24 hour  Intake  935.6 ml  Output    600 ml  Net  335.6 ml    LABS: Basic Metabolic Panel:  Recent Labs  08/09/15 0605  08/09/15 1403 08/10/15 0330 08/11/15 0539  NA  --   < > 131*  129* 130* 129*  K  --   < > 4.3  4.2 3.9 3.6  CL  --   < > 94*  94* 91* 92*  CO2  --   < > 22  21* 25 24  GLUCOSE  --   < > 225*  227* 191* 143*  BUN  --   < > 67*  67* 67* 68*  CREATININE  --   < > 3.51*  3.52* 3.49* 3.44*  CALCIUM  --   < >  9.9  9.8 9.6 9.3  MG 1.8  --   --   --   --   PHOS  --   --  6.1*  --   --   < > = values in this interval not displayed. Liver Function Tests:  Recent Labs  08/09/15 1403 08/10/15 0330  AST  --  22  ALT  --  38  ALKPHOS  --  85  BILITOT  --  1.3*  PROT  --  7.0  ALBUMIN 3.2* 3.3*   No results for input(s): LIPASE, AMYLASE in the last 72 hours. CBC:  Recent Labs  08/10/15 0330 08/11/15 0539  WBC 3.7* 4.2  HGB 9.5* 9.7*  HCT 30.1* 30.0*  MCV 94.1 93.2  PLT 201 189   Cardiac Enzymes:  Recent Labs  08/09/15 0141 08/09/15 0556 08/09/15 1403  TROPONINI 0.20* 0.24* 0.17*   BNP: Invalid input(s): POCBNP D-Dimer:  No results for input(s): DDIMER in the last 72 hours. Hemoglobin A1C: No results for input(s): HGBA1C in the last 72 hours. Fasting Lipid Panel: No results for input(s): CHOL, HDL, LDLCALC, TRIG, CHOLHDL, LDLDIRECT in the last 72 hours. Thyroid Function Tests: No results for input(s): TSH, T4TOTAL, T3FREE, THYROIDAB in the last 72 hours.  Invalid input(s): FREET3 Anemia Panel: No results for input(s): VITAMINB12, FOLATE, FERRITIN, TIBC, IRON, RETICCTPCT in the last 72 hours.  RADIOLOGY: Dg Chest 2 View  08/08/2015  CLINICAL DATA:  Weakness of bilateral lower extremity swelling for 2 days. EXAM: CHEST  2 VIEW COMPARISON:  Single-view of the chest 07/22/2015. FINDINGS: There is marked cardiomegaly. Interstitial pulmonary edema is identified and new since the prior exam. No pneumothorax or pleural effusion. The patient is status post CABG. Right PICC is unchanged. IMPRESSION: Interstitial pulmonary edema and marked cardiomegaly. Electronically Signed   By: Inge Rise M.D.   On: 08/08/2015 18:14   Mr Abdomen Wo Contrast  07/26/2015  CLINICAL DATA:  75 year old female inpatient with indeterminate right renal mass on renal ultrasound. EXAM: MRI ABDOMEN WITHOUT CONTRAST TECHNIQUE: Multiplanar multisequence MR imaging was performed without the administration  of intravenous contrast. COMPARISON:  07/24/2015 renal sonogram . FINDINGS: Lower chest: Clear lung bases. Cardiomegaly. Susceptibility artifact from sternotomy wires. Hepatobiliary: Normal liver size and configuration. No hepatic steatosis. No liver mass. Focal adenomyomatosis in the fundus of the gallbladder. Otherwise normal gallbladder with no cholelithiasis. No biliary ductal dilatation. Common bile duct diameter 4 mm. No choledocholithiasis. Pancreas: No pancreatic mass or duct dilation.  No pancreas divisum. Spleen: Normal size. No mass. Adrenals/Urinary Tract: Normal adrenals. No hydronephrosis. There is a complex 3.6 x 3.2 x 3.5 cm cystic renal mass in the lateral upper right kidney (series 13/ image 12), incompletely characterized on this noncontrast MRI, which demonstrates thickened internal septations, scattered internal subcentimeter nodular foci, and heterogeneous internal areas of restricted diffusion, suspicious for renal cell carcinoma. There are several similar-appearing homogeneous T1 hyperintense T2 hypointense renal cortical lesions in both kidneys, largest 1.1 cm in the posterior upper right kidney and 1.0 cm in the posterior upper left kidney, which are incompletely characterized on this noncontrast MRI, and are most suggestive of hemorrhagic/proteinaceous renal cysts. There is a simple appearing 0.9 cm renal cortical cyst in the mid to upper left kidney. Stomach/Bowel: Grossly normal stomach. Visualized small and large bowel is normal caliber, with no bowel wall thickening. Vascular/Lymphatic: Normal caliber abdominal aorta. No pathologically enlarged lymph nodes in the abdomen. Other: No abdominal ascites or focal fluid collection. Musculoskeletal: No aggressive appearing focal osseous lesions. IMPRESSION: 1. 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 Urology consultation advised. 2. Several small T1  hyperintense renal cortical lesions in both kidneys, incompletely characterized on this noncontrast MRI, most suggestive of hemorrhagic/proteinaceous renal cysts. Follow-up renal protocol MRI abdomen is recommended in 6 months to reassess these renal lesions, preferably without and with IV contrast if the patient's renal function has improved. 3. No noncontrast MRI evidence of metastatic disease in the abdomen. 4. Focal adenomyomatosis of the gallbladder fundus. Otherwise normal gallbladder, with no cholelithiasis. No biliary ductal dilatation. 5. Cardiomegaly. Electronically Signed   By: Ilona Sorrel M.D.   On: 07/26/2015 11:44   US Renal  07/24/2015  CLINICAL DATA:  Acute renal failure. EXAM: RENAL / URINARY TRACT ULTRASOUND COMPLETE COMPARISON:  None. FINDINGS: Right Kidney: Length: 10.2 cm. There is a 3.2 x  3.2 x 3 cm mass in the mid right kidney. This does is not demonstrate the appearance of a simple cyst and may contain solid components. No hydronephrosis. Cortical thinning is identified. Left Kidney: Length: 10.1 cm.  Cortical thinning with no hydronephrosis. Bladder: Appears normal for degree of bladder distention. IMPRESSION: 1. There is a mass in the right kidney which does not have the appearance of a simple cyst and may contain solid components. This raises the possibility of a renal neoplasm. This should be further assessed with a contrast-enhanced CT scan or MRI. MRI would be the most sensitive and specific exam. Electronically Signed   By: Dorise Bullion III M.D   On: 07/24/2015 19:09   Dg Chest Port 1 View  07/22/2015  CLINICAL DATA:  Acute on chronic CHF, mitral and aortic insufficiency, coronary artery disease, former smoker, obesity. EXAM: PORTABLE CHEST 1 VIEW COMPARISON:  Chest x-ray of December 04, 2010 FINDINGS: The lungs are adequately inflated. There is no focal infiltrate and no pleural effusion. The cardiac silhouette is enlarged. The pulmonary vascularity is engorged with mild  cephalization observed. There are post CABG changes. There is calcification in the aortic arch. The PICC line tip projects over the midportion of the SVC. The observed bony thorax exhibits no acute abnormality. IMPRESSION: 1. CHF with mild pulmonary vascular congestion. There is no pneumonia. 2. Aortic atherosclerosis. Electronically Signed   By: David  Martinique M.D.   On: 07/22/2015 07:33    PHYSICAL EXAM CVP 18  General: NAD Neck: JVP to jaw , no thyromegaly or thyroid nodule.  Lungs: Slight dependent crackles.  CV: Nondisplaced PMI.  Heart regular S1/S2, no S3/S4, 1/6 SEM RUSB.  R and LLE 1+ edema to knees bilaterally.   Abdomen: Soft, nontender, no hepatosplenomegaly, no distention.  Neurologic: Alert and oriented x 3.  Psych: Normal affect. Extremities: No clubbing or cyanosis.   TELEMETRY: Reviewed telemetry pt in NSR  ASSESSMENT AND PLAN: 75 yo with CAD, ischemic CMP, and CKD had recent admission with acute/chronic systolic CHF and ended up requiring home milrinone.  She was seen on 7/10 in clinic and noted to be volume overloaded.  Labs showed hyperkalemia and AKI so she was sent to the ER and subsequently admitted.  1. Acute on chronic systolic CHF: Ischemic cardiomyopathy. Most recent echo in 6/17 with EF 25-30% and moderately decreased RV systolic function. Medical titration limited by low BP and renal dysfunction. She was recently in the hospital and diuresed for acute/chronic systolic CHF. She required milrinone and we were unable to titrate her off. She was admitted 7/10 with AKI/hyperkalemia.  Inotrope switched to dobutamine with AKI/hypotension.  She remains volume overloaded with CVP 18,  co-ox improved this morning at 65% on dobutamine 5 mcg + norepi 6 mcg.  Situation is complicated by the combination of CHF and AKI.  - Off beta blocker and Bidil with hypotension.   - Still volume overloaded with high CVP => poor response to Lasix 160 mg IV q8 hr, will add metolazone 5 mg  bid. - Difficult situation.  Not candidate for LVAD with renal failure and RV failure.  She would be a poor long-term HD candidate given need for inotropes.  We do not have a lot of good options for her.  If renal failure continues to be progressive and we continue to be unable to diurese her, could envision short term CVVH trial but there may not be a good end point to this. Ultimately hospice/comfort care may be  most appropriate path.  She has met with palliative care and they will return this morning.  2. CAD: s/p CABG. No chest pain since prior to admission at [facility] in 5/17. Given fall in EF, concerned for worsened CAD. Ideally, she would have coronary angiography. However, given lack of chest pain and elevated creatinine we have held off. Continue ASA 81 and atorvastatin.  3. 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  Renal following, still inadequate UOP.  If we continue aggressive support, think we are nearing need for CVVH.  BP, however, may limit and endpoint would be unclear. 4. ID: Blood cultures negative, afebrile.     Loralie Champagne  08/11/2015 7:32 AM

## 2015-08-11 NOTE — Progress Notes (Signed)
Admit: 08/08/2015 LOS: 3  86F with decompnesated sHF from ICM, AoCKD4 from cardiorenal syndrome, failure to thrive.   Subjective:  In chair this AM Palliative care saw ysterday Started metolazone with AHF this AM Stable low GFR  07/12 0701 - 07/13 0700 In: 935.6 [P.O.:440; I.V.:297.6; IV Piggyback:198] Out: 600 [Urine:600]  Filed Weights   08/09/15 0551 08/10/15 0300 08/11/15 0619  Weight: 91.2 kg (201 lb 1 oz) 91.4 kg (201 lb 8 oz) 91.2 kg (201 lb 1 oz)    Scheduled Meds: . aspirin EC  81 mg Oral Daily  . atorvastatin  40 mg Oral q1800  . cholecalciferol  5,000 Units Oral Daily  . famotidine  20 mg Oral Daily  . furosemide  160 mg Intravenous Q8H  . heparin subcutaneous  5,000 Units Subcutaneous Q8H  . insulin aspart  0-15 Units Subcutaneous TID WC  . insulin aspart protamine- aspart  25 Units Subcutaneous BID WC  . metolazone  5 mg Oral BID  . sodium chloride flush  10-40 mL Intracatheter Q12H  . sodium chloride flush  3 mL Intravenous Q12H   Continuous Infusions: . DOBUTamine 5 mcg/kg/min (08/11/15 0800)  . norepinephrine (LEVOPHED) Adult infusion 6 mcg/min (08/11/15 0800)   PRN Meds:.sodium chloride, acetaminophen, ALPRAZolam, fentaNYL (SUBLIMAZE) injection, ondansetron (ZOFRAN) IV, sodium chloride flush, sodium chloride flush, zolpidem  Current Labs: reviewed    Physical Exam:  Blood pressure 89/69, pulse 96, temperature 97.7 F (36.5 C), temperature source Oral, resp. rate 16, height 5\' 2"  (1.575 m), weight 91.2 kg (201 lb 1 oz), SpO2 100 %. GEN: frail, elderly female, falls asleep in room ENT: NCAT EYES: EOMI CV: RRR PULM: bibasilar crackles ABD: s/nt/nd SKIN: no rashes/lesiosn EXT:2+ LEE   Assessment   1. 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 Decompensated sCHF 2/2 ICM on dobutamine gtt 3. Hypervolemia on bolus lasix and metolazone  Plan 1. Palliative care has been consulted 2. Don't oppose CRRT just don't think we should employ yet, see how  palliative meeing goes reassess in AM 3. If fails to diurese or worsened GFR will offer with realistic expectations a defined period of CRRT   Sabra Heckyan Sanford MD 08/11/2015, 11:19 AM   Recent Labs Lab 08/09/15 1403 08/10/15 0330 08/11/15 0539  NA 131*  129* 130* 129*  K 4.3  4.2 3.9 3.6  CL 94*  94* 91* 92*  CO2 22  21* 25 24  GLUCOSE 225*  227* 191* 143*  BUN 67*  67* 67* 68*  CREATININE 3.51*  3.52* 3.49* 3.44*  CALCIUM 9.9  9.8 9.6 9.3  PHOS 6.1*  --   --     Recent Labs Lab 08/08/15 1853 08/10/15 0330 08/11/15 0539  WBC 5.1 3.7* 4.2  HGB 10.1* 9.5* 9.7*  HCT 30.8* 30.1* 30.0*  MCV 93.1 94.1 93.2  PLT 226 201 189

## 2015-08-11 NOTE — Progress Notes (Signed)
Results for [name] [name], [name] [name] (MRN [id number]) as of 08/11/2015 08:56  Ref. Range 08/10/2015 21:39 08/10/2015 22:15 08/10/2015 22:21 08/11/2015 00:07 08/11/2015 08:01  Glucose-Capillary Latest Ref Range: 65-99 mg/dL 38 (LL) >696>600 (HH) 295375 (H) 123 (H) 161 (H)  Noted that patient had low blood sugar of 38 mg/dl. Recommend decreasing Novolog correction scale to SENSITIVE TID & HS. Continue same 70/30 25 units BID. Smith MinceKendra Elisavet Buehrer RN BSN CDE

## 2015-08-12 LAB — URINE CULTURE: Culture: 50000 — AB

## 2015-08-12 LAB — BASIC METABOLIC PANEL
Anion gap: 16 — ABNORMAL HIGH (ref 5–15)
BUN: 64 mg/dL — AB (ref 6–20)
CHLORIDE: 87 mmol/L — AB (ref 101–111)
CO2: 25 mmol/L (ref 22–32)
CREATININE: 3.21 mg/dL — AB (ref 0.44–1.00)
Calcium: 9.4 mg/dL (ref 8.9–10.3)
GFR calc non Af Amer: 13 mL/min — ABNORMAL LOW (ref >60)
GFR, EST AFRICAN AMERICAN: 15 mL/min — AB (ref >60)
Glucose, Bld: 138 mg/dL — ABNORMAL HIGH (ref 65–99)
POTASSIUM: 3.3 mmol/L — AB (ref 3.5–5.1)
Sodium: 128 mmol/L — ABNORMAL LOW (ref 135–145)

## 2015-08-12 LAB — CBC
HEMATOCRIT: 32.1 % — AB (ref 36.0–46.0)
Hemoglobin: 10.1 g/dL — ABNORMAL LOW (ref 12.0–15.0)
MCH: 29.4 pg (ref 26.0–34.0)
MCHC: 31.5 g/dL (ref 30.0–36.0)
MCV: 93.3 fL (ref 78.0–100.0)
PLATELETS: 207 10*3/uL (ref 150–400)
RBC: 3.44 MIL/uL — AB (ref 3.87–5.11)
RDW: 16 % — ABNORMAL HIGH (ref 11.5–15.5)
WBC: 4.1 10*3/uL (ref 4.0–10.5)

## 2015-08-12 LAB — CARBOXYHEMOGLOBIN
CARBOXYHEMOGLOBIN: 1.8 % — AB (ref 0.5–1.5)
METHEMOGLOBIN: 0.6 % (ref 0.0–1.5)
O2 Saturation: 66 %
Total hemoglobin: 9.7 g/dL — ABNORMAL LOW (ref 12.0–16.0)

## 2015-08-12 LAB — GLUCOSE, CAPILLARY
GLUCOSE-CAPILLARY: 155 mg/dL — AB (ref 65–99)
Glucose-Capillary: 171 mg/dL — ABNORMAL HIGH (ref 65–99)

## 2015-08-12 MED ORDER — HALOPERIDOL LACTATE 5 MG/ML IJ SOLN: 0.5000 mg | INTRAMUSCULAR | Status: DC | PRN

## 2015-08-12 MED ORDER — ONDANSETRON HCL 4 MG/2ML IJ SOLN
4.0000 mg | Freq: Four times a day (QID) | INTRAMUSCULAR | Status: DC | PRN
Start: 2015-08-12 — End: 2015-08-16
  Administered 2015-08-12: 4 mg via INTRAVENOUS
  Filled 2015-08-12: qty 2

## 2015-08-12 MED ORDER — ONDANSETRON 4 MG PO TBDP: 4.0000 mg | ORAL_TABLET | Freq: Four times a day (QID) | ORAL | Status: DC | PRN

## 2015-08-12 MED ORDER — GLYCOPYRROLATE 0.2 MG/ML IJ SOLN
0.2000 mg | INTRAMUSCULAR | Status: DC | PRN
  Filled 2015-08-12: qty 1

## 2015-08-12 MED ORDER — BIOTENE DRY MOUTH MT LIQD: 15.0000 mL | OROMUCOSAL | Status: DC | PRN

## 2015-08-12 MED ORDER — FENTANYL CITRATE (PF) 100 MCG/2ML IJ SOLN
25.0000 ug | INTRAMUSCULAR | Status: DC | PRN
  Administered 2015-08-14 – 2015-08-16 (×7): 50 ug via INTRAVENOUS
  Filled 2015-08-12 (×7): qty 2

## 2015-08-12 MED ORDER — PROMETHAZINE HCL 25 MG/ML IJ SOLN
6.2500 mg | Freq: Four times a day (QID) | INTRAMUSCULAR | Status: DC | PRN
  Administered 2015-08-12: 6.25 mg via INTRAVENOUS
  Filled 2015-08-12: qty 1

## 2015-08-12 MED ORDER — GLYCOPYRROLATE 1 MG PO TABS
1.0000 mg | ORAL_TABLET | ORAL | Status: DC | PRN
  Filled 2015-08-12: qty 1

## 2015-08-12 MED ORDER — HALOPERIDOL 1 MG PO TABS
0.5000 mg | ORAL_TABLET | ORAL | Status: DC | PRN
  Filled 2015-08-12: qty 1

## 2015-08-12 MED ORDER — POTASSIUM CHLORIDE CRYS ER 20 MEQ PO TBCR
40.0000 meq | EXTENDED_RELEASE_TABLET | Freq: Once | ORAL | Status: AC
  Administered 2015-08-12: 40 meq via ORAL
  Filled 2015-08-12: qty 2

## 2015-08-12 MED ORDER — POLYVINYL ALCOHOL 1.4 % OP SOLN
1.0000 [drp] | Freq: Four times a day (QID) | OPHTHALMIC | Status: DC | PRN
  Filled 2015-08-12: qty 15

## 2015-08-12 MED ORDER — HALOPERIDOL LACTATE 2 MG/ML PO CONC
0.5000 mg | ORAL | Status: DC | PRN
  Filled 2015-08-12: qty 0.3

## 2015-08-12 NOTE — Progress Notes (Signed)
Patient ID: [name] [name], female   DOB: 05/18/1940, 75 y.o.   MRN: [id number] S: Reviewed medical history and current regimen with [name] [name] and her family.  She understands that Cardiology has nothing more to offer and she is at peace with that.  She agrees not to pursue CVVHD as this is only a temporary procedure and will not alter her cardiac function nor reverse her overall function.  She is amenable to hospice.  Family is also in agreement with her decision. O:BP 103/69 mmHg  Pulse 96  Temp(Src) 97.8 F (36.6 C) (Oral)  Resp 18  Ht 5\' 2"  (1.575 m)  Wt 92.8 kg (204 lb 9.4 oz)  BMI 37.41 kg/m2  SpO2 100%  Intake/Output Summary (Last 24 hours) at 08/12/15 0833 Last data filed at 08/12/15 0800  Gross per 24 hour  Intake  965.6 ml  Output   1150 ml  Net -184.4 ml   Intake/Output: I/O last 3 completed shifts: In: 1434 [P.O.:670; I.V.:434; IV Piggyback:330] Out: 1750 [Urine:1750]  Intake/Output this shift:  Total I/O In: 24.8 [I.V.:24.8] Out: -  Weight change: 1.6 kg (3 lb 8.4 oz) NWG:NFAOZHYGen:elderly, chronically ill-appearing AAF CVS:no rub Resp:decreased BS QMV:HQIONGAbd:benign Ext:+edema   Recent Labs Lab 08/08/15 1015 08/08/15 1853 08/09/15 0932 08/09/15 1403 08/10/15 0330 08/11/15 0539 08/12/15 0431  NA 128* 126* 128* 131*  129* 130* 129* 128*  K 6.2* 5.9* 4.2 4.3  4.2 3.9 3.6 3.3*  CL 93* 92* 95* 94*  94* 91* 92* 87*  CO2 23 21* 22 22  21* 25 24 25   GLUCOSE 214* 207* 191* 225*  227* 191* 143* 138*  BUN 69* 71* 67* 67*  67* 67* 68* 64*  CREATININE 3.95* 4.08* 3.56* 3.51*  3.52* 3.49* 3.44* 3.21*  ALBUMIN  --   --   --  3.2* 3.3*  --   --   CALCIUM 10.0 9.8 9.9 9.9  9.8 9.6 9.3 9.4  PHOS  --   --   --  6.1*  --   --   --   AST  --   --   --   --  22  --   --   ALT  --   --   --   --  38  --   --    Liver Function Tests:  Recent Labs Lab 08/09/15 1403 08/10/15 0330  AST  --  22  ALT  --  38  ALKPHOS  --  85  BILITOT  --  1.3*  PROT  --  7.0  ALBUMIN 3.2*  3.3*   No results for input(s): LIPASE, AMYLASE in the last 168 hours.  Recent Labs Lab 08/09/15 1501  AMMONIA 27   CBC:  Recent Labs Lab 08/08/15 1853 08/10/15 0330 08/11/15 0539 08/12/15 0431  WBC 5.1 3.7* 4.2 4.1  HGB 10.1* 9.5* 9.7* 10.1*  HCT 30.8* 30.1* 30.0* 32.1*  MCV 93.1 94.1 93.2 93.3  PLT 226 201 189 207   Cardiac Enzymes:  Recent Labs Lab 08/09/15 0141 08/09/15 0556 08/09/15 1403  TROPONINI 0.20* 0.24* 0.17*   CBG:  Recent Labs Lab 08/11/15 0801 08/11/15 1143 08/11/15 1635 08/11/15 1709 08/11/15 2139  GLUCAP 161* 140* 59* 100* 131*    Iron Studies: No results for input(s): IRON, TIBC, TRANSFERRIN, FERRITIN in the last 72 hours. Studies/Results: No results found. Marland Kitchen. aspirin EC  81 mg Oral Daily  . atorvastatin  40 mg Oral q1800  . cholecalciferol  5,000 Units Oral Daily  .  famotidine  20 mg Oral Daily  . furosemide  160 mg Intravenous Q8H  . heparin subcutaneous  5,000 Units Subcutaneous Q8H  . insulin aspart  0-15 Units Subcutaneous TID WC  . insulin aspart protamine- aspart  25 Units Subcutaneous BID WC  . metolazone  5 mg Oral BID  . sodium chloride flush  10-40 mL Intracatheter Q12H  . sodium chloride flush  3 mL Intravenous Q12H    BMET    Component Value Date/Time   NA 128* 08/12/2015 0431   K 3.3* 08/12/2015 0431   CL 87* 08/12/2015 0431   CO2 25 08/12/2015 0431   GLUCOSE 138* 08/12/2015 0431   BUN 64* 08/12/2015 0431   CREATININE 3.21* 08/12/2015 0431   CREATININE 1.96* 09/13/2014 1633   CALCIUM 9.4 08/12/2015 0431   GFRNONAA 13* 08/12/2015 0431   GFRAA 15* 08/12/2015 0431   CBC    Component Value Date/Time   WBC 4.1 08/12/2015 0431   RBC 3.44* 08/12/2015 0431   HGB 10.1* 08/12/2015 0431   HCT 32.1* 08/12/2015 0431   PLT 207 08/12/2015 0431   MCV 93.3 08/12/2015 0431   MCH 29.4 08/12/2015 0431   MCHC 31.5 08/12/2015 0431   RDW 16.0* 08/12/2015 0431   LYMPHSABS 1.3 07/21/2015 1438   MONOABS 0.5 07/21/2015 1438    EOSABS 0.2 07/21/2015 1438   BASOSABS 0.0 07/21/2015 1438   75 yo with CAD, ischemic CMP, and CKD stage 3-4 with recent admission due to acute/chronic systolic CHF and requiring home milrinone. She was admitted on 7/10 from heart failure clinic with decompensated CHF and volume overloaded, as well as hyperkalemia and AKI.   Assessment/Plan:  1. AKI/CKD due to cardiorenal syndrome. Requiring multiple ionotropes and diuretics.   1. UOP improved with addition of metolazone 2. Scr stable to slightly improved. 3. Palliative care following 4. Continue with conservative therapy 5. Would not recommend CVVHD as this will not alter her overall prognosis and will not improve her CMP as she has failed home milrinone as well as inpatient escalation of pressors.  She does not wish to proceed with dialysis and is amenable to hospice  2. Ischemic CMP- heart failure team closely managing 3. Anemia of chronic kidney disease 4. Hypotension- on pressors 5. Disposition- poor prognosis.  Palliative care following and now amenable to comfort measures.  Will sign off.  Julien Nordmann [name] [name]

## 2015-08-12 NOTE — Progress Notes (Signed)
Daily Progress Note   Patient Name: [name] [name]       Date: 08/12/2015 DOB: [birth year]  Age: 75 y.o. MRN#: [id number] Attending Physician: Larey Dresser, MD Primary Care Physician: No primary care provider on file. Admit Date: 08/08/2015  Reason for Consultation/Follow-up: Establishing goals of care  Subjective: I met again today with [name] [name] along with multiple family members including her husband and daughters. We have had multiple conversations over the past few days and [name] [name] has decided today to transition to full comfort care. We discussed comfort and transition from monitor and IV gtts to comfort meds as needed and to transition to regular room on 6N for more comfortable environment. Emotional support provided.   [name] [name] says that she has made her peace with her prognosis and decisions. Family confirms that if she is at peace then they can be at peace. [name] [name] is very quiet and tearful. Family continues to support each other.   Length of Stay: 4  Current Medications: Scheduled Meds:  . famotidine  20 mg Oral Daily  . furosemide  160 mg Intravenous Q8H  . insulin aspart  0-15 Units Subcutaneous TID WC  . metolazone  5 mg Oral BID  . sodium chloride flush  10-40 mL Intracatheter Q12H    Continuous Infusions:    PRN Meds: acetaminophen, ALPRAZolam, antiseptic oral rinse, fentaNYL (SUBLIMAZE) injection, glycopyrrolate **OR** glycopyrrolate **OR** glycopyrrolate, haloperidol **OR** haloperidol **OR** haloperidol lactate, ondansetron **OR** ondansetron (ZOFRAN) IV, polyvinyl alcohol, sodium chloride flush, zolpidem  Physical Exam  Constitutional: She is oriented to person, place, and time. She appears well-developed.  HENT:  Head: Normocephalic and atraumatic.    Cardiovascular: Normal rate.   Pulmonary/Chest: Effort normal. No accessory muscle usage. No tachypnea. No respiratory distress.  Abdominal: Soft. Normal appearance.  Neurological: She is alert and oriented to person, place, and time.            Vital Signs: BP 111/89 mmHg  Pulse 90  Temp(Src) 98 F (36.7 C) (Oral)  Resp 14  Ht '5\' 2"'$  (1.575 m)  Wt 92.8 kg (204 lb 9.4 oz)  BMI 37.41 kg/m2  SpO2 100% SpO2: SpO2: 100 % O2 Device: O2 Device: Not Delivered O2 Flow Rate:    Intake/output summary:  Intake/Output Summary (Last 24 hours) at 08/12/15 1406 Last  data filed at 08/12/15 1300  Gross per 24 hour  Intake 852.59 ml  Output    900 ml  Net -47.41 ml   LBM: Last BM Date: 08/12/15 Baseline Weight: Weight: 91.2 kg (201 lb 1 oz) Most recent weight: Weight: 92.8 kg (204 lb 9.4 oz)       Palliative Assessment/Data:    Flowsheet Rows        Most Recent Value   Intake Tab    Referral Department  Cardiology   Unit at Time of Referral  ICU   Palliative Care Primary Diagnosis  Cardiac   Date Notified  08/10/15   Palliative Care Type  New Palliative care   Reason for referral  Clarify Goals of Care   Date of Admission  08/08/15   # of days IP prior to Palliative referral  2   Clinical Assessment    Psychosocial & Spiritual Assessment    Palliative Care Outcomes       Patient Active Problem List   Diagnosis Date Noted  . Palliative care encounter   . DNR (do not resuscitate)   . Acute on chronic renal failure ([place]) 08/08/2015  . Hyperkalemia 08/08/2015  . Acute on chronic systolic and diastolic heart failure, NYHA class 4 ([place]) 08/08/2015  . NSTEMI (non-ST elevated myocardial infarction) ([place])   . VT (ventricular tachycardia) ([place])   . Acute respiratory failure with hypoxia ([place])   . CKD (chronic kidney disease) stage 3, GFR 30-59 ml/min   . Acute on chronic systolic CHF (congestive heart failure) ([place]) 07/21/2015  . Chronic systolic heart failure ([place]) 06/29/2015   . CKD (chronic kidney disease), stage III 06/29/2015  . Hypotension 06/29/2015  . Sinus bradycardia 04/09/2012  . Chronic renal insufficiency 06/12/2011  . HYPERLIPIDEMIA-MIXED 08/19/2008  . Overweight 08/19/2008  . MITRAL REGURG W/ AORTIC INSUFF, RHEUM/NON-RHEUM 08/19/2008  . Essential hypertension 08/19/2008  . CAD, ARTERY BYPASS GRAFT 08/19/2008  . Cardiomyopathy, ischemic 08/19/2008    Palliative Care Assessment & Plan   Patient Profile: 75 y.o. female with past medical history of CAD, ischemic CMP, and CKD had recent admission with acute/chronic systolic CHF and ended up requiring home milrinone. She was seen on 08/08/2015 in clinic and noted to be volume overloaded. Labs showed hyperkalemia and AKI so she was sent to the ER and subsequently admitted. She is requiring levophed and dobutamine but SVO2 remains very low and UOP remains low. Unfortunately she has not responded well to medical treatment and fear of cardiorenal and may be approaching EOL.    Assessment: Lying in bed. More tired today but arousable and maintains mental clarity.   Recommendations/Plan:  Full comfort care  Pain/dyspnea: Fentanyl prn. May be increase dosage/frequency as needed to achieve comfort.  Nausea: Zofran 4 mg every 6 hours prn.   Goals of Care and Additional Recommendations:  Limitations on Scope of Treatment: Full Comfort Care  Code Status:    Code Status Orders        Start     Ordered   08/12/15 1252  Do not attempt resuscitation (DNR)   Continuous    Question Answer Comment  In the event of cardiac or respiratory ARREST Do not call a "code blue"   In the event of cardiac or respiratory ARREST Do not perform Intubation, CPR, defibrillation or ACLS   In the event of cardiac or respiratory ARREST Use medication by any route, position, wound care, and other measures to relive pain and suffering. May use oxygen, suction  and manual treatment of airway obstruction as needed for  comfort.      08/12/15 1252    Code Status History    Date Active Date Inactive Code Status Order ID Comments User Context   08/10/2015  5:06 PM 08/12/2015 12:52 PM DNR [id number]  Pershing Proud, NP Inpatient   08/08/2015  7:47 PM 08/10/2015  5:06 PM Full Code [id number]  Lonn Georgia, PA-C ED   07/21/2015  9:15 AM 08/03/2015  8:14 PM Full Code [id number]  Larey Dresser, MD Inpatient    Advance Directive Documentation        Most Recent Value   Type of Advance Directive  Healthcare Power of Attorney   Pre-existing out of facility DNR order (yellow form or pink MOST form)     "MOST" Form in Place?         Prognosis:   Likely days.   Discharge Planning:  To Be Determined. Possible hospital death.    Thank you for allowing the Palliative Medicine Team to assist in the care of this patient.   Time In: 1230 Time Out: 1300 Total Time 5mn Prolonged Time Billed  no       Greater than 50%  of this time was spent counseling and coordinating care related to the above assessment and plan.  PPershing Proud NP  Please contact Palliative Medicine Team phone at [phone] questions and concerns.

## 2015-08-12 NOTE — Progress Notes (Signed)
   08/12/15 1100  Clinical Encounter Type  Visited With Patient not available;Family  Visit Type Critical Care  Referral From Nurse  Spiritual Encounters  Spiritual Needs Prayer;Emotional;Grief support  Stress Factors  Family Stress Factors Loss  CH called to support family of pt following Renal MD indicating pt status; CH sat with family offering spiritual, emotional and grief support along with prayer.  Spiritual support available, if needed, for Palliative Care GOC meeting later.  11:03 AM Erline LevineMichael I Misbah Hornaday

## 2015-08-12 NOTE — Progress Notes (Signed)
Results for Harriett SineSCALES, Meosha C (MRN [id number]) as of 08/12/2015 08:24  Ref. Range 08/11/2015 08:01 08/11/2015 11:43 08/11/2015 16:35 08/11/2015 17:09 08/11/2015 21:39  Glucose-Capillary Latest Ref Range: 65-99 mg/dL 865161 (H) 784140 (H) 59 (L) 100 (H) 131 (H)  Noted that blood sugars have been below 70 mg/dl. Recommend decreasing 70/30 insulin dosage to 20 units BID if blood sugars continue to be low. Patient seems to be eating 50% of meals at this time.  May need to decrease Novolog correction scale to SENSITIVE TID & HS. Will continue to monitor blood sugars while in the hospital. Smith MinceKendra Dawnette Mione RN BSN CDE

## 2015-08-12 NOTE — Progress Notes (Signed)
Patient ID: [name] [name], female   DOB: 12/05/1940, 75 y.o.   MRN: [id number]    SUBJECTIVE:   More drowsy, has been nauseated.  SBP in 90s-100s on norepinephrine @ 6 and dobutamine @ 5.  Co-ox 66%, CVP 22.  1150 cc UOP. Creatinine stable at 3.21.    Met with palliative care on 7/12, now DNR.    Scheduled Meds: . aspirin EC  81 mg Oral Daily  . atorvastatin  40 mg Oral q1800  . cholecalciferol  5,000 Units Oral Daily  . famotidine  20 mg Oral Daily  . furosemide  160 mg Intravenous Q8H  . heparin subcutaneous  5,000 Units Subcutaneous Q8H  . insulin aspart  0-15 Units Subcutaneous TID WC  . insulin aspart protamine- aspart  25 Units Subcutaneous BID WC  . metolazone  5 mg Oral BID  . potassium chloride  40 mEq Oral Once  . sodium chloride flush  10-40 mL Intracatheter Q12H  . sodium chloride flush  3 mL Intravenous Q12H   Continuous Infusions: . DOBUTamine 5 mcg/kg/min (08/11/15 2000)  . norepinephrine (LEVOPHED) Adult infusion 6 mcg/min (08/11/15 2000)   PRN Meds:.sodium chloride, acetaminophen, ALPRAZolam, fentaNYL (SUBLIMAZE) injection, ondansetron (ZOFRAN) IV, sodium chloride flush, sodium chloride flush, zolpidem    Filed Vitals:   08/12/15 0422 08/12/15 0500 08/12/15 0600 08/12/15 0700  BP:  105/78 103/70 103/69  Pulse:      Temp:      TempSrc:      Resp:  _0 Height:      Weight: 204 lb 9.4 oz (92.8 kg)     SpO2:        Intake/Output Summary (Last 24 hours) at 08/12/15 0745 Last data filed at 08/12/15 0600  Gross per 24 hour  Intake  887.2 ml  Output   1150 ml  Net -262.8 ml    LABS: Basic Metabolic Panel:  Recent Labs  08/09/15 1403  08/11/15 0539 08/12/15 0431  NA 131*  129*  < > 129* 128*  K 4.3  4.2  < > 3.6 3.3*  CL 94*  94*  < > 92* 87*  CO2 22  21*  < > 24 25  GLUCOSE 225*  227*  < > 143* 138*  BUN 67*  67*  < > 68* 64*  CREATININE 3.51*  3.52*  < > 3.44* 3.21*  CALCIUM 9.9  9.8  < > 9.3 9.4  PHOS 6.1*  --   --   --     < > = values in this interval not displayed. Liver Function Tests:  Recent Labs  08/09/15 1403 08/10/15 0330  AST  --  22  ALT  --  38  ALKPHOS  --  85  BILITOT  --  1.3*  PROT  --  7.0  ALBUMIN 3.2* 3.3*   No results for input(s): LIPASE, AMYLASE in the last 72 hours. CBC:  Recent Labs  08/11/15 0539 08/12/15 0431  WBC 4.2 4.1  HGB 9.7* 10.1*  HCT 30.0* 32.1*  MCV 93.2 93.3  PLT 189 207   Cardiac Enzymes:  Recent Labs  08/09/15 1403  TROPONINI 0.17*   BNP: Invalid input(s): POCBNP D-Dimer: No results for input(s): DDIMER in the last 72 hours. Hemoglobin A1C: No results for input(s): HGBA1C in the last 72 hours. Fasting Lipid Panel: No results for input(s): CHOL, HDL, LDLCALC, TRIG, CHOLHDL, LDLDIRECT in the last 72 hours. Thyroid Function Tests: No results for input(s): TSH, T4TOTAL, T3FREE,  THYROIDAB in the last 72 hours.  Invalid input(s): FREET3 Anemia Panel: No results for input(s): VITAMINB12, FOLATE, FERRITIN, TIBC, IRON, RETICCTPCT in the last 72 hours.  RADIOLOGY: Dg Chest 2 View  08/08/2015  CLINICAL DATA:  Weakness of bilateral lower extremity swelling for 2 days. EXAM: CHEST  2 VIEW COMPARISON:  Single-view of the chest 07/22/2015. FINDINGS: There is marked cardiomegaly. Interstitial pulmonary edema is identified and new since the prior exam. No pneumothorax or pleural effusion. The patient is status post CABG. Right PICC is unchanged. IMPRESSION: Interstitial pulmonary edema and marked cardiomegaly. Electronically Signed   By: Inge Rise M.D.   On: 08/08/2015 18:14   Mr Abdomen Wo Contrast  07/26/2015  CLINICAL DATA:  75 year old female inpatient with indeterminate right renal mass on renal ultrasound. EXAM: MRI ABDOMEN WITHOUT CONTRAST TECHNIQUE: Multiplanar multisequence MR imaging was performed without the administration of intravenous contrast. COMPARISON:  07/24/2015 renal sonogram . FINDINGS: Lower chest: Clear lung bases. Cardiomegaly.  Susceptibility artifact from sternotomy wires. Hepatobiliary: Normal liver size and configuration. No hepatic steatosis. No liver mass. Focal adenomyomatosis in the fundus of the gallbladder. Otherwise normal gallbladder with no cholelithiasis. No biliary ductal dilatation. Common bile duct diameter 4 mm. No choledocholithiasis. Pancreas: No pancreatic mass or duct dilation.  No pancreas divisum. Spleen: Normal size. No mass. Adrenals/Urinary Tract: Normal adrenals. No hydronephrosis. There is a complex 3.6 x 3.2 x 3.5 cm cystic renal mass in the lateral upper right kidney (series 13/ image 12), incompletely characterized on this noncontrast MRI, which demonstrates thickened internal septations, scattered internal subcentimeter nodular foci, and heterogeneous internal areas of restricted diffusion, suspicious for renal cell carcinoma. There are several similar-appearing homogeneous T1 hyperintense T2 hypointense renal cortical lesions in both kidneys, largest 1.1 cm in the posterior upper right kidney and 1.0 cm in the posterior upper left kidney, which are incompletely characterized on this noncontrast MRI, and are most suggestive of hemorrhagic/proteinaceous renal cysts. There is a simple appearing 0.9 cm renal cortical cyst in the mid to upper left kidney. Stomach/Bowel: Grossly normal stomach. Visualized small and large bowel is normal caliber, with no bowel wall thickening. Vascular/Lymphatic: Normal caliber abdominal aorta. No pathologically enlarged lymph nodes in the abdomen. Other: No abdominal ascites or focal fluid collection. Musculoskeletal: No aggressive appearing focal osseous lesions. IMPRESSION: 1. 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 Urology consultation advised. 2. Several small T1 hyperintense renal cortical lesions in both kidneys, incompletely characterized on this noncontrast MRI, most suggestive  of hemorrhagic/proteinaceous renal cysts. Follow-up renal protocol MRI abdomen is recommended in 6 months to reassess these renal lesions, preferably without and with IV contrast if the patient's renal function has improved. 3. No noncontrast MRI evidence of metastatic disease in the abdomen. 4. Focal adenomyomatosis of the gallbladder fundus. Otherwise normal gallbladder, with no cholelithiasis. No biliary ductal dilatation. 5. Cardiomegaly. Electronically Signed   By: Ilona Sorrel M.D.   On: 07/26/2015 11:44   US Renal  07/24/2015  CLINICAL DATA:  Acute renal failure. EXAM: RENAL / URINARY TRACT ULTRASOUND COMPLETE COMPARISON:  None. FINDINGS: Right Kidney: Length: 10.2 cm. There is a 3.2 x 3.2 x 3 cm mass in the mid right kidney. This does is not demonstrate the appearance of a simple cyst and may contain solid components. No hydronephrosis. Cortical thinning is identified. Left Kidney: Length: 10.1 cm.  Cortical thinning with no hydronephrosis. Bladder: Appears normal for degree of bladder  distention. IMPRESSION: 1. There is a mass in the right kidney which does not have the appearance of a simple cyst and may contain solid components. This raises the possibility of a renal neoplasm. This should be further assessed with a contrast-enhanced CT scan or MRI. MRI would be the most sensitive and specific exam. Electronically Signed   By: Dorise Bullion III M.D   On: 07/24/2015 19:09   Dg Chest Port 1 View  07/22/2015  CLINICAL DATA:  Acute on chronic CHF, mitral and aortic insufficiency, coronary artery disease, former smoker, obesity. EXAM: PORTABLE CHEST 1 VIEW COMPARISON:  Chest x-ray of December 04, 2010 FINDINGS: The lungs are adequately inflated. There is no focal infiltrate and no pleural effusion. The cardiac silhouette is enlarged. The pulmonary vascularity is engorged with mild cephalization observed. There are post CABG changes. There is calcification in the aortic arch. The PICC line tip projects  over the midportion of the SVC. The observed bony thorax exhibits no acute abnormality. IMPRESSION: 1. CHF with mild pulmonary vascular congestion. There is no pneumonia. 2. Aortic atherosclerosis. Electronically Signed   By: David  Martinique M.D.   On: 07/22/2015 07:33    PHYSICAL EXAM CVP 22 General: NAD Neck: JVP to jaw , no thyromegaly or thyroid nodule.  Lungs: Slight dependent crackles.  CV: Nondisplaced PMI.  Heart regular S1/S2, no S3/S4, 1/6 SEM RUSB.  R and LLE 1+ edema to knees bilaterally.   Abdomen: Soft, nontender, no hepatosplenomegaly, no distention.  Neurologic: Drowsy this morning.  Psych: Normal affect. Extremities: No clubbing or cyanosis.   TELEMETRY: Reviewed telemetry pt in NSR  ASSESSMENT AND PLAN: 75 yo with CAD, ischemic CMP, and CKD had recent admission with acute/chronic systolic CHF and ended up requiring home milrinone.  She was seen on 7/10 in clinic and noted to be volume overloaded.  Labs showed hyperkalemia and AKI so she was sent to the ER and subsequently admitted.  1. Acute on chronic systolic CHF: Ischemic cardiomyopathy. Most recent echo in 6/17 with EF 25-30% and moderately decreased RV systolic function. Medical titration limited by low BP and renal dysfunction. She was recently in the hospital and diuresed for acute/chronic systolic CHF. She required milrinone and we were unable to titrate her off. She was admitted 7/10 with AKI/hyperkalemia.  Inotrope switched to dobutamine with AKI/hypotension.  She remains volume overloaded with CVP 22,  co-ox improved this morning at 65% on dobutamine 5 mcg + norepi 6 mcg.  Situation is complicated by the combination of CHF and AKI.  - Off beta blocker and Bidil with hypotension.   - Still volume overloaded with high CVP => poor response to Lasix 160 mg IV q8 hr + metolazone 5 mg bid. - Difficult situation.  Not candidate for LVAD with renal failure and RV failure.  She would be a poor long-term HD candidate given  need for inotropes.  We do not have a lot of good options for her.  Could envision short term CVVH trial but there is not a good end point to this. Ultimately hospice/comfort care seems the most appropriate path.  I talked with her daughters this morning and they seem to be interested in a comfort care approach.  Will ask palliative care service to see them this morning, and if they opt for comfort measures, we can begin peeling back support. 2. CAD: s/p CABG. No chest pain since prior to admission at [facility] in 5/17. Given fall in EF, concerned for worsened CAD. Ideally, she  would have coronary angiography. However, given lack of chest pain and elevated creatinine we have held off. Continue ASA 81 and atorvastatin.  3. 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  Renal following, still inadequate UOP.  If we continue aggressive support, think we are nearing need for CVVH.  BP, however, may limit and endpoint would be unclear. 4. ID: Blood cultures negative, afebrile.     Loralie Champagne  08/12/2015 7:45 AM

## 2015-08-12 NOTE — Progress Notes (Signed)
Order for transfer noted. Dobutamine and levophed titrated off. Update in status given to Dr. Shirlee LatchMclean. Rm [id number] available.Report called to Ms. Okey Regalarol. Pt and family informed of new room, pt and family verbalized understanding. Pt's belongings packed, Pt transported to new room with belongings(denture) via bed.   [facility] picked up pt's home pump prior to transfer and family informed.

## 2015-08-13 LAB — CULTURE, RESPIRATORY: CULTURE: NORMAL

## 2015-08-13 LAB — CULTURE, RESPIRATORY W GRAM STAIN

## 2015-08-13 NOTE — Progress Notes (Signed)
Patient ID: [name] [name], female   DOB: September 11, 1940, 75 y.o.   MRN: [id number]    SUBJECTIVE:   She is now DNR, getting comfort care.  Pressors/inotropes stopped.  She is hypotensive.  Currently sleeping comfortably.   Scheduled Meds: . famotidine  20 mg Oral Daily  . furosemide  160 mg Intravenous Q8H  . metolazone  5 mg Oral BID  . sodium chloride flush  10-40 mL Intracatheter Q12H   Continuous Infusions:   PRN Meds:.acetaminophen, ALPRAZolam, antiseptic oral rinse, fentaNYL (SUBLIMAZE) injection, glycopyrrolate **OR** glycopyrrolate **OR** glycopyrrolate, haloperidol **OR** haloperidol **OR** haloperidol lactate, ondansetron **OR** ondansetron (ZOFRAN) IV, polyvinyl alcohol, promethazine, sodium chloride flush, zolpidem    Filed Vitals:   08/12/15 1600 08/12/15 1645 08/12/15 2120 08/13/15 0547  BP: 90/53 125/97 83/57 76/46   Pulse: 84 85 81 74  Temp:  97.9 F (36.6 C) 97.5 F (36.4 C) 97.5 F (36.4 C)  TempSrc:  Oral Oral Oral  Resp: 31 26    Height:      Weight:      SpO2: 96% 100% 100% 100%    Intake/Output Summary (Last 24 hours) at 08/13/15 1301 Last data filed at 08/13/15 0601  Gross per 24 hour  Intake     66 ml  Output    600 ml  Net   -534 ml    LABS: Basic Metabolic Panel:  Recent Labs  40/98/11 0539 08/12/15 0431  NA 129* 128*  K 3.6 3.3*  CL 92* 87*  CO2 24 25  GLUCOSE 143* 138*  BUN 68* 64*  CREATININE 3.44* 3.21*  CALCIUM 9.3 9.4   Liver Function Tests: No results for input(s): AST, ALT, ALKPHOS, BILITOT, PROT, ALBUMIN in the last 72 hours. No results for input(s): LIPASE, AMYLASE in the last 72 hours. CBC:  Recent Labs  08/11/15 0539 08/12/15 0431  WBC 4.2 4.1  HGB 9.7* 10.1*  HCT 30.0* 32.1*  MCV 93.2 93.3  PLT 189 207   Cardiac Enzymes: No results for input(s): CKTOTAL, CKMB, CKMBINDEX, TROPONINI in the last 72 hours. BNP: Invalid input(s): POCBNP D-Dimer: No results for input(s): DDIMER in the last 72 hours. Hemoglobin  A1C: No results for input(s): HGBA1C in the last 72 hours. Fasting Lipid Panel: No results for input(s): CHOL, HDL, LDLCALC, TRIG, CHOLHDL, LDLDIRECT in the last 72 hours. Thyroid Function Tests: No results for input(s): TSH, T4TOTAL, T3FREE, THYROIDAB in the last 72 hours.  Invalid input(s): FREET3 Anemia Panel: No results for input(s): VITAMINB12, FOLATE, FERRITIN, TIBC, IRON, RETICCTPCT in the last 72 hours.  RADIOLOGY: Dg Chest 2 View  08/08/2015  CLINICAL DATA:  Weakness of bilateral lower extremity swelling for 2 days. EXAM: CHEST  2 VIEW COMPARISON:  Single-view of the chest 07/22/2015. FINDINGS: There is marked cardiomegaly. Interstitial pulmonary edema is identified and new since the prior exam. No pneumothorax or pleural effusion. The patient is status post CABG. Right PICC is unchanged. IMPRESSION: Interstitial pulmonary edema and marked cardiomegaly. Electronically Signed   By: Drusilla Kanner M.D.   On: 08/08/2015 18:14   Mr Abdomen Wo Contrast  07/26/2015  CLINICAL DATA:  75 year old female inpatient with indeterminate right renal mass on renal ultrasound. EXAM: MRI ABDOMEN WITHOUT CONTRAST TECHNIQUE: Multiplanar multisequence MR imaging was performed without the administration of intravenous contrast. COMPARISON:  07/24/2015 renal sonogram . FINDINGS: Lower chest: Clear lung bases. Cardiomegaly. Susceptibility artifact from sternotomy wires. Hepatobiliary: Normal liver size and configuration. No hepatic steatosis. No liver mass. Focal adenomyomatosis in the fundus of the  gallbladder. Otherwise normal gallbladder with no cholelithiasis. No biliary ductal dilatation. Common bile duct diameter 4 mm. No choledocholithiasis. Pancreas: No pancreatic mass or duct dilation.  No pancreas divisum. Spleen: Normal size. No mass. Adrenals/Urinary Tract: Normal adrenals. No hydronephrosis. There is a complex 3.6 x 3.2 x 3.5 cm cystic renal mass in the lateral upper right kidney (series 13/ image  12), incompletely characterized on this noncontrast MRI, which demonstrates thickened internal septations, scattered internal subcentimeter nodular foci, and heterogeneous internal areas of restricted diffusion, suspicious for renal cell carcinoma. There are several similar-appearing homogeneous T1 hyperintense T2 hypointense renal cortical lesions in both kidneys, largest 1.1 cm in the posterior upper right kidney and 1.0 cm in the posterior upper left kidney, which are incompletely characterized on this noncontrast MRI, and are most suggestive of hemorrhagic/proteinaceous renal cysts. There is a simple appearing 0.9 cm renal cortical cyst in the mid to upper left kidney. Stomach/Bowel: Grossly normal stomach. Visualized small and large bowel is normal caliber, with no bowel wall thickening. Vascular/Lymphatic: Normal caliber abdominal aorta. No pathologically enlarged lymph nodes in the abdomen. Other: No abdominal ascites or focal fluid collection. Musculoskeletal: No aggressive appearing focal osseous lesions. IMPRESSION: 1. 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 Urology consultation advised. 2. Several small T1 hyperintense renal cortical lesions in both kidneys, incompletely characterized on this noncontrast MRI, most suggestive of hemorrhagic/proteinaceous renal cysts. Follow-up renal protocol MRI abdomen is recommended in 6 months to reassess these renal lesions, preferably without and with IV contrast if the patient's renal function has improved. 3. No noncontrast MRI evidence of metastatic disease in the abdomen. 4. Focal adenomyomatosis of the gallbladder fundus. Otherwise normal gallbladder, with no cholelithiasis. No biliary ductal dilatation. 5. Cardiomegaly. Electronically Signed   By: Delbert Phenix M.D.   On: 07/26/2015 11:44   US Renal  07/24/2015  CLINICAL DATA:  Acute renal failure. EXAM: RENAL /  URINARY TRACT ULTRASOUND COMPLETE COMPARISON:  None. FINDINGS: Right Kidney: Length: 10.2 cm. There is a 3.2 x 3.2 x 3 cm mass in the mid right kidney. This does is not demonstrate the appearance of a simple cyst and may contain solid components. No hydronephrosis. Cortical thinning is identified. Left Kidney: Length: 10.1 cm.  Cortical thinning with no hydronephrosis. Bladder: Appears normal for degree of bladder distention. IMPRESSION: 1. There is a mass in the right kidney which does not have the appearance of a simple cyst and may contain solid components. This raises the possibility of a renal neoplasm. This should be further assessed with a contrast-enhanced CT scan or MRI. MRI would be the most sensitive and specific exam. Electronically Signed   By: Gerome Sam III M.D   On: 07/24/2015 19:09   Dg Chest Port 1 View  07/22/2015  CLINICAL DATA:  Acute on chronic CHF, mitral and aortic insufficiency, coronary artery disease, former smoker, obesity. EXAM: PORTABLE CHEST 1 VIEW COMPARISON:  Chest x-ray of December 04, 2010 FINDINGS: The lungs are adequately inflated. There is no focal infiltrate and no pleural effusion. The cardiac silhouette is enlarged. The pulmonary vascularity is engorged with mild cephalization observed. There are post CABG changes. There is calcification in the aortic arch. The PICC line tip projects over the midportion of the SVC. The observed bony thorax exhibits no acute abnormality. IMPRESSION: 1. CHF with mild pulmonary vascular congestion. There is no pneumonia. 2. Aortic atherosclerosis. Electronically Signed   By:  David  [place] M.D.   On: 07/22/2015 07:33    PHYSICAL EXAM General: NAD, sleeping. Neck: JVP to jaw , no thyromegaly or thyroid nodule.  Lungs: Slight dependent crackles.  CV: Nondisplaced PMI.  Heart regular S1/S2, no S3/S4, 1/6 SEM RUSB.  R and LLE 1+ edema to knees bilaterally.   Abdomen: Soft, nontender, no hepatosplenomegaly, no distention.  Neurologic:  Sleeping.  Extremities: No clubbing or cyanosis.   ASSESSMENT AND PLAN: 75 yo with CAD, ischemic CMP, and CKD had recent admission with acute/chronic systolic CHF and ended up requiring home milrinone.  She was seen on 7/10 in clinic and noted to be volume overloaded.  Labs showed hyperkalemia and AKI so she was sent to the ER and subsequently admitted.  1. Acute on chronic systolic CHF: Ischemic cardiomyopathy. Most recent echo in 6/17 with EF 25-30% and moderately decreased RV systolic function. Medical titration limited by low BP and renal dysfunction. She was recently in the hospital and diuresed for acute/chronic systolic CHF. She required milrinone and we were unable to titrate her off. She was admitted 7/10 with AKI/hyperkalemia.  Inotrope switched to dobutamine with AKI/hypotension.  She required addition of norepinephrine. She has now been made comfort care and dobutamine/norepinephrine stopped. - Continue diuretics for comfort. 2. CAD: s/p CABG. 3. 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  She would not be HD candidate and we opted against [facility]. 4. She is now getting hospice/comfort care.  I expect she will expire in next 24-48 hours.     Marca AnconaDalton McLean  08/13/2015 1:01 PM

## 2015-08-14 NOTE — Progress Notes (Signed)
Patient ID: [name] [name], female   DOB: 04/03/1940, 75 y.o.   MRN: [id number]    SUBJECTIVE:   She is now DNR, getting comfort care.  Pressors/inotropes stopped.  She appears comfortable.  Drowsy but able to wake up and says she is in no pain.    Scheduled Meds: . famotidine  20 mg Oral Daily  . furosemide  160 mg Intravenous Q8H  . metolazone  5 mg Oral BID  . sodium chloride flush  10-40 mL Intracatheter Q12H   Continuous Infusions:   PRN Meds:.acetaminophen, ALPRAZolam, antiseptic oral rinse, fentaNYL (SUBLIMAZE) injection, glycopyrrolate **OR** glycopyrrolate **OR** glycopyrrolate, haloperidol **OR** haloperidol **OR** haloperidol lactate, ondansetron **OR** ondansetron (ZOFRAN) IV, polyvinyl alcohol, promethazine, sodium chloride flush, zolpidem    Filed Vitals:   08/12/15 1645 08/12/15 2120 08/13/15 0547 08/13/15 1427  BP: 125/97 83/57 76/46  88/57  Pulse: 85 81 74 73  Temp: 97.9 F (36.6 C) 97.5 F (36.4 C) 97.5 F (36.4 C) 97.8 F (36.6 C)  TempSrc: Oral Oral Oral Axillary  Resp: 26   22  Height:      Weight:      SpO2: 100% 100% 100% 100%    Intake/Output Summary (Last 24 hours) at 08/14/15 1238 Last data filed at 08/13/15 2300  Gross per 24 hour  Intake    264 ml  Output      0 ml  Net    264 ml    LABS: Basic Metabolic Panel:  Recent Labs  19/14/78 0431  NA 128*  K 3.3*  CL 87*  CO2 25  GLUCOSE 138*  BUN 64*  CREATININE 3.21*  CALCIUM 9.4   Liver Function Tests: No results for input(s): AST, ALT, ALKPHOS, BILITOT, PROT, ALBUMIN in the last 72 hours. No results for input(s): LIPASE, AMYLASE in the last 72 hours. CBC:  Recent Labs  08/12/15 0431  WBC 4.1  HGB 10.1*  HCT 32.1*  MCV 93.3  PLT 207   Cardiac Enzymes: No results for input(s): CKTOTAL, CKMB, CKMBINDEX, TROPONINI in the last 72 hours. BNP: Invalid input(s): POCBNP D-Dimer: No results for input(s): DDIMER in the last 72 hours. Hemoglobin A1C: No results for input(s):  HGBA1C in the last 72 hours. Fasting Lipid Panel: No results for input(s): CHOL, HDL, LDLCALC, TRIG, CHOLHDL, LDLDIRECT in the last 72 hours. Thyroid Function Tests: No results for input(s): TSH, T4TOTAL, T3FREE, THYROIDAB in the last 72 hours.  Invalid input(s): FREET3 Anemia Panel: No results for input(s): VITAMINB12, FOLATE, FERRITIN, TIBC, IRON, RETICCTPCT in the last 72 hours.  RADIOLOGY: Dg Chest 2 View  08/08/2015  CLINICAL DATA:  Weakness of bilateral lower extremity swelling for 2 days. EXAM: CHEST  2 VIEW COMPARISON:  Single-view of the chest 07/22/2015. FINDINGS: There is marked cardiomegaly. Interstitial pulmonary edema is identified and new since the prior exam. No pneumothorax or pleural effusion. The patient is status post CABG. Right PICC is unchanged. IMPRESSION: Interstitial pulmonary edema and marked cardiomegaly. Electronically Signed   By: Drusilla Kanner M.D.   On: 08/08/2015 18:14   Mr Abdomen Wo Contrast  07/26/2015  CLINICAL DATA:  75 year old female inpatient with indeterminate right renal mass on renal ultrasound. EXAM: MRI ABDOMEN WITHOUT CONTRAST TECHNIQUE: Multiplanar multisequence MR imaging was performed without the administration of intravenous contrast. COMPARISON:  07/24/2015 renal sonogram . FINDINGS: Lower chest: Clear lung bases. Cardiomegaly. Susceptibility artifact from sternotomy wires. Hepatobiliary: Normal liver size and configuration. No hepatic steatosis. No liver mass. Focal adenomyomatosis in the fundus of the gallbladder.  Otherwise normal gallbladder with no cholelithiasis. No biliary ductal dilatation. Common bile duct diameter 4 mm. No choledocholithiasis. Pancreas: No pancreatic mass or duct dilation.  No pancreas divisum. Spleen: Normal size. No mass. Adrenals/Urinary Tract: Normal adrenals. No hydronephrosis. There is a complex 3.6 x 3.2 x 3.5 cm cystic renal mass in the lateral upper right kidney (series 13/ image 12), incompletely characterized  on this noncontrast MRI, which demonstrates thickened internal septations, scattered internal subcentimeter nodular foci, and heterogeneous internal areas of restricted diffusion, suspicious for renal cell carcinoma. There are several similar-appearing homogeneous T1 hyperintense T2 hypointense renal cortical lesions in both kidneys, largest 1.1 cm in the posterior upper right kidney and 1.0 cm in the posterior upper left kidney, which are incompletely characterized on this noncontrast MRI, and are most suggestive of hemorrhagic/proteinaceous renal cysts. There is a simple appearing 0.9 cm renal cortical cyst in the mid to upper left kidney. Stomach/Bowel: Grossly normal stomach. Visualized small and large bowel is normal caliber, with no bowel wall thickening. Vascular/Lymphatic: Normal caliber abdominal aorta. No pathologically enlarged lymph nodes in the abdomen. Other: No abdominal ascites or focal fluid collection. Musculoskeletal: No aggressive appearing focal osseous lesions. IMPRESSION: 1. 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 Urology consultation advised. 2. Several small T1 hyperintense renal cortical lesions in both kidneys, incompletely characterized on this noncontrast MRI, most suggestive of hemorrhagic/proteinaceous renal cysts. Follow-up renal protocol MRI abdomen is recommended in 6 months to reassess these renal lesions, preferably without and with IV contrast if the patient's renal function has improved. 3. No noncontrast MRI evidence of metastatic disease in the abdomen. 4. Focal adenomyomatosis of the gallbladder fundus. Otherwise normal gallbladder, with no cholelithiasis. No biliary ductal dilatation. 5. Cardiomegaly. Electronically Signed   By: Delbert Phenix M.D.   On: 07/26/2015 11:44   US Renal  07/24/2015  CLINICAL DATA:  Acute renal failure. EXAM: RENAL / URINARY TRACT ULTRASOUND COMPLETE  COMPARISON:  None. FINDINGS: Right Kidney: Length: 10.2 cm. There is a 3.2 x 3.2 x 3 cm mass in the mid right kidney. This does is not demonstrate the appearance of a simple cyst and may contain solid components. No hydronephrosis. Cortical thinning is identified. Left Kidney: Length: 10.1 cm.  Cortical thinning with no hydronephrosis. Bladder: Appears normal for degree of bladder distention. IMPRESSION: 1. There is a mass in the right kidney which does not have the appearance of a simple cyst and may contain solid components. This raises the possibility of a renal neoplasm. This should be further assessed with a contrast-enhanced CT scan or MRI. MRI would be the most sensitive and specific exam. Electronically Signed   By: Gerome Sam III M.D   On: 07/24/2015 19:09   Dg Chest Port 1 View  07/22/2015  CLINICAL DATA:  Acute on chronic CHF, mitral and aortic insufficiency, coronary artery disease, former smoker, obesity. EXAM: PORTABLE CHEST 1 VIEW COMPARISON:  Chest x-ray of December 04, 2010 FINDINGS: The lungs are adequately inflated. There is no focal infiltrate and no pleural effusion. The cardiac silhouette is enlarged. The pulmonary vascularity is engorged with mild cephalization observed. There are post CABG changes. There is calcification in the aortic arch. The PICC line tip projects over the midportion of the SVC. The observed bony thorax exhibits no acute abnormality. IMPRESSION: 1. CHF with mild pulmonary vascular congestion. There is no pneumonia. 2. Aortic atherosclerosis. Electronically Signed   By: Onalee Hua  [place] M.D.   On: 07/22/2015 07:33    PHYSICAL EXAM General: NAD, sleeping. Neck: JVP to jaw , no thyromegaly or thyroid nodule.  Lungs: Slight dependent crackles.  CV: Nondisplaced PMI.  Heart regular S1/S2, no S3/S4, 1/6 SEM RUSB.  R and LLE 1+ edema to knees bilaterally.   Abdomen: Soft, nontender, no hepatosplenomegaly, no distention.  Neurologic: Sleeping.  Extremities: No  clubbing or cyanosis.   ASSESSMENT AND PLAN: 75 yo with CAD, ischemic CMP, and CKD had recent admission with acute/chronic systolic CHF and ended up requiring home milrinone.  She was seen on 7/10 in clinic and noted to be volume overloaded.  Labs showed hyperkalemia and AKI so she was sent to the ER and subsequently admitted.  1. Acute on chronic systolic CHF: Ischemic cardiomyopathy. Most recent echo in 6/17 with EF 25-30% and moderately decreased RV systolic function. Medical titration limited by low BP and renal dysfunction. She was recently in the hospital and diuresed for acute/chronic systolic CHF. She required milrinone and we were unable to titrate her off. She was admitted 7/10 with AKI/hyperkalemia.  Inotrope switched to dobutamine with AKI/hypotension.  She required addition of norepinephrine. She has now been made comfort care and dobutamine/norepinephrine stopped. - Continue diuretics for comfort. 2. CAD: s/p CABG. 3. 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  She would not be HD candidate and we opted against [facility]. 4. She is now getting hospice/comfort care.  I expect she will expire in next 24-48 hours.     [name] [name]  08/14/2015 12:38 PM

## 2015-08-15 ENCOUNTER — Inpatient Hospital Stay (HOSPITAL_COMMUNITY): Admission: RE | Admit: 2015-08-15 | Payer: Medicare Other | Source: Ambulatory Visit

## 2015-08-15 LAB — CULTURE, BLOOD (ROUTINE X 2)
CULTURE: NO GROWTH
CULTURE: NO GROWTH

## 2015-08-15 LAB — GLUCOSE, CAPILLARY

## 2015-08-15 NOTE — Progress Notes (Signed)
Patient ID: [name] [name], female   DOB: December 31, 1940, 75 y.o.   MRN: [id number]    SUBJECTIVE:   She is now DNR, getting comfort care.  Pressors/inotropes stopped.  Occasionally getting chest heaviness with heavy breathing, otherwise no complaints. She appears comfortable.  No confusion per family.   Scheduled Meds: . famotidine  20 mg Oral Daily  . furosemide  160 mg Intravenous Q8H  . metolazone  5 mg Oral BID  . sodium chloride flush  10-40 mL Intracatheter Q12H   Continuous Infusions:   PRN Meds:.acetaminophen, ALPRAZolam, antiseptic oral rinse, fentaNYL (SUBLIMAZE) injection, glycopyrrolate **OR** glycopyrrolate **OR** glycopyrrolate, haloperidol **OR** haloperidol **OR** haloperidol lactate, ondansetron **OR** ondansetron (ZOFRAN) IV, polyvinyl alcohol, promethazine, sodium chloride flush, zolpidem    Filed Vitals:   08/12/15 2120 08/13/15 0547 08/13/15 1427 08/15/15 0553  BP: 83/57 76/46 88/57  81/50  Pulse: 81 74 73 79  Temp: 97.5 F (36.4 C) 97.5 F (36.4 C) 97.8 F (36.6 C) 98 F (36.7 C)  TempSrc: Oral Oral Axillary Oral  Resp:   22 19  Height:      Weight:      SpO2: 100% 100% 100% 100%    Intake/Output Summary (Last 24 hours) at 08/15/15 1143 Last data filed at 08/14/15 2142  Gross per 24 hour  Intake    252 ml  Output    300 ml  Net    -48 ml    LABS: Basic Metabolic Panel: No results for input(s): NA, K, CL, CO2, GLUCOSE, BUN, CREATININE, CALCIUM, MG, PHOS in the last 72 hours. Liver Function Tests: No results for input(s): AST, ALT, ALKPHOS, BILITOT, PROT, ALBUMIN in the last 72 hours. No results for input(s): LIPASE, AMYLASE in the last 72 hours. CBC: No results for input(s): WBC, NEUTROABS, HGB, HCT, MCV, PLT in the last 72 hours. Cardiac Enzymes: No results for input(s): CKTOTAL, CKMB, CKMBINDEX, TROPONINI in the last 72 hours. BNP: Invalid input(s): POCBNP D-Dimer: No results for input(s): DDIMER in the last 72 hours. Hemoglobin A1C: No  results for input(s): HGBA1C in the last 72 hours. Fasting Lipid Panel: No results for input(s): CHOL, HDL, LDLCALC, TRIG, CHOLHDL, LDLDIRECT in the last 72 hours. Thyroid Function Tests: No results for input(s): TSH, T4TOTAL, T3FREE, THYROIDAB in the last 72 hours.  Invalid input(s): FREET3 Anemia Panel: No results for input(s): VITAMINB12, FOLATE, FERRITIN, TIBC, IRON, RETICCTPCT in the last 72 hours.  RADIOLOGY: Dg Chest 2 View  08/08/2015  CLINICAL DATA:  Weakness of bilateral lower extremity swelling for 2 days. EXAM: CHEST  2 VIEW COMPARISON:  Single-view of the chest 07/22/2015. FINDINGS: There is marked cardiomegaly. Interstitial pulmonary edema is identified and new since the prior exam. No pneumothorax or pleural effusion. The patient is status post CABG. Right PICC is unchanged. IMPRESSION: Interstitial pulmonary edema and marked cardiomegaly. Electronically Signed   By: Drusilla Kanner M.D.   On: 08/08/2015 18:14   Mr Abdomen Wo Contrast  07/26/2015  CLINICAL DATA:  75 year old female inpatient with indeterminate right renal mass on renal ultrasound. EXAM: MRI ABDOMEN WITHOUT CONTRAST TECHNIQUE: Multiplanar multisequence MR imaging was performed without the administration of intravenous contrast. COMPARISON:  07/24/2015 renal sonogram . FINDINGS: Lower chest: Clear lung bases. Cardiomegaly. Susceptibility artifact from sternotomy wires. Hepatobiliary: Normal liver size and configuration. No hepatic steatosis. No liver mass. Focal adenomyomatosis in the fundus of the gallbladder. Otherwise normal gallbladder with no cholelithiasis. No biliary ductal dilatation. Common bile duct diameter 4 mm. No choledocholithiasis. Pancreas: No pancreatic mass or  duct dilation.  No pancreas divisum. Spleen: Normal size. No mass. Adrenals/Urinary Tract: Normal adrenals. No hydronephrosis. There is a complex 3.6 x 3.2 x 3.5 cm cystic renal mass in the lateral upper right kidney (series 13/ image 12),  incompletely characterized on this noncontrast MRI, which demonstrates thickened internal septations, scattered internal subcentimeter nodular foci, and heterogeneous internal areas of restricted diffusion, suspicious for renal cell carcinoma. There are several similar-appearing homogeneous T1 hyperintense T2 hypointense renal cortical lesions in both kidneys, largest 1.1 cm in the posterior upper right kidney and 1.0 cm in the posterior upper left kidney, which are incompletely characterized on this noncontrast MRI, and are most suggestive of hemorrhagic/proteinaceous renal cysts. There is a simple appearing 0.9 cm renal cortical cyst in the mid to upper left kidney. Stomach/Bowel: Grossly normal stomach. Visualized small and large bowel is normal caliber, with no bowel wall thickening. Vascular/Lymphatic: Normal caliber abdominal aorta. No pathologically enlarged lymph nodes in the abdomen. Other: No abdominal ascites or focal fluid collection. Musculoskeletal: No aggressive appearing focal osseous lesions. IMPRESSION: 1. 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 Urology consultation advised. 2. Several small T1 hyperintense renal cortical lesions in both kidneys, incompletely characterized on this noncontrast MRI, most suggestive of hemorrhagic/proteinaceous renal cysts. Follow-up renal protocol MRI abdomen is recommended in 6 months to reassess these renal lesions, preferably without and with IV contrast if the patient's renal function has improved. 3. No noncontrast MRI evidence of metastatic disease in the abdomen. 4. Focal adenomyomatosis of the gallbladder fundus. Otherwise normal gallbladder, with no cholelithiasis. No biliary ductal dilatation. 5. Cardiomegaly. Electronically Signed   By: Delbert PhenixJason A Poff M.D.   On: 07/26/2015 11:44   [place] Renal  07/24/2015  CLINICAL DATA:  Acute renal failure. EXAM: RENAL / URINARY  TRACT ULTRASOUND COMPLETE COMPARISON:  None. FINDINGS: Right Kidney: Length: 10.2 cm. There is a 3.2 x 3.2 x 3 cm mass in the mid right kidney. This does is not demonstrate the appearance of a simple cyst and may contain solid components. No hydronephrosis. Cortical thinning is identified. Left Kidney: Length: 10.1 cm.  Cortical thinning with no hydronephrosis. Bladder: Appears normal for degree of bladder distention. IMPRESSION: 1. There is a mass in the right kidney which does not have the appearance of a simple cyst and may contain solid components. This raises the possibility of a renal neoplasm. This should be further assessed with a contrast-enhanced CT scan or MRI. MRI would be the most sensitive and specific exam. Electronically Signed   By: Gerome Samavid  Williams III M.D   On: 07/24/2015 19:09   Dg Chest Port 1 View  07/22/2015  CLINICAL DATA:  Acute on chronic CHF, mitral and aortic insufficiency, coronary artery disease, former smoker, obesity. EXAM: PORTABLE CHEST 1 VIEW COMPARISON:  Chest x-ray of December 04, 2010 FINDINGS: The lungs are adequately inflated. There is no focal infiltrate and no pleural effusion. The cardiac silhouette is enlarged. The pulmonary vascularity is engorged with mild cephalization observed. There are post CABG changes. There is calcification in the aortic arch. The PICC line tip projects over the midportion of the SVC. The observed bony thorax exhibits no acute abnormality. IMPRESSION: 1. CHF with mild pulmonary vascular congestion. There is no pneumonia. 2. Aortic atherosclerosis. Electronically Signed   By: David  SwazilandJordan M.D.   On: 07/22/2015 07:33    PHYSICAL EXAM General: NAD Neck: JVP to jaw , no thyromegaly or  thyroid nodule.  Lungs: Slight dependent crackles.  CV: Nondisplaced PMI.  Heart regular S1/S2, no S3/S4, 1/6 SEM RUSB.  R and LLE 1+ edema to knees bilaterally.   Abdomen: Soft, NT, ND, no HSM. No bruits or masses. +BS  Neurologic: Sleeping.  Extremities: No  clubbing or cyanosis.   ASSESSMENT AND PLAN: 75 yo with CAD, ischemic CMP, and CKD had recent admission with acute/chronic systolic CHF and ended up requiring home milrinone.  She was seen on 7/10 in clinic and noted to be volume overloaded.  Labs showed hyperkalemia and AKI so she was sent to the ER and subsequently admitted.  1. Acute on chronic systolic CHF: Ischemic cardiomyopathy. Most recent echo in 6/17 with EF 25-30% and moderately decreased RV systolic function. Medical titration limited by low BP and renal dysfunction. She was recently in the hospital and diuresed for acute/chronic systolic CHF. She required milrinone and we were unable to titrate her off. She was admitted 7/10 with AKI/hyperkalemia.  Inotrope switched to dobutamine with AKI/hypotension.  She required addition of norepinephrine. She has now been made comfort care and dobutamine/norepinephrine stopped. - Continue diuretics for comfort. 2. CAD: s/p CABG. 3. 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  She would not be HD candidate and we opted against [facility]. 4. She is now getting hospice/comfort care.  Expect she will expire in next 24-48 hours.     Graciella Freer, PA-C 08/15/2015 11:43 AM   Advanced Heart Failure Team Pager [phone] (M-F; 7a - 4p)  Please contact [facility] for night-coverage after hours (4p -7a ) and weekends on [email or web address]  Agree with the above note.  Continue comfort care.    Marca Ancona 08/15/2015

## 2015-08-15 NOTE — Progress Notes (Signed)
Palliative Medicine RN Note: checked on pt. She denies pain and SOB. Husband is not here, and only 2 daughters are present. Scheduled follow up meeting for tomorrow morning with PMT NP A Parker to provide update to family.  [name] ChanceMelanie G. Telma Pyeatt, RN, BSN, [facility] 08/15/2015 2:47 PM Cell [phone] 8:00-4:00 Monday-Friday Office [phone]

## 2015-08-15 NOTE — Care Management Important Message (Signed)
Important Message  Patient Details  Name: [name] SineDorothy C Belland MRN: [id number] Date of Birth: 04/01/1940   Medicare Important Message Given:  Yes    Bernadette HoitShoffner, Peyson Delao Coleman 08/15/2015, 3:11 PM

## 2015-08-16 MED ORDER — BISACODYL 10 MG RE SUPP: 10.0000 mg | Freq: Every day | RECTAL | Status: DC | PRN

## 2015-08-16 MED ORDER — SENNOSIDES-DOCUSATE SODIUM 8.6-50 MG PO TABS
1.0000 | ORAL_TABLET | Freq: Every day | ORAL | Status: DC | PRN
Start: 2015-08-16 — End: 2015-08-16

## 2015-08-16 MED ORDER — POLYVINYL ALCOHOL 1.4 % OP SOLN: 1.0000 [drp] | Freq: Four times a day (QID) | OPHTHALMIC | Status: AC | PRN

## 2015-08-16 MED ORDER — ONDANSETRON 4 MG PO TBDP: 4.0000 mg | ORAL_TABLET | Freq: Four times a day (QID) | ORAL | Status: AC | PRN

## 2015-08-16 MED ORDER — OXYCODONE HCL 5 MG PO TABS
5.0000 mg | ORAL_TABLET | ORAL | Status: DC | PRN
  Administered 2015-08-16: 5 mg via ORAL
  Filled 2015-08-16: qty 1

## 2015-08-16 MED ORDER — ALPRAZOLAM 0.25 MG PO TABS: 0.2500 mg | ORAL_TABLET | Freq: Two times a day (BID) | ORAL | Status: AC | PRN

## 2015-08-16 NOTE — Clinical Social Work Note (Addendum)
CSW spoke with patient's family regarding hospice facility placement, [facility].  [facility], and they are requesting clinicals to be faxed to them in order to review information to make sure patient is eligible.  CSW to fax clinicals to hospice facility, awaiting for call back.  12:15pm  [facility] said [facility] can [facility] today.  CSW informed PA that patient has a bed available and [facility]  [name] KnackEric R. Teruo [name], MSW, LCSWA [phone] 08/16/2015 11:09 AM

## 2015-08-16 NOTE — Clinical Social Work Note (Signed)
[facility]  Patient and family agreeable to plans will transport via ems RN to call report to [phone].  Patient's family was at bedside and all were informed that patient will be discharging today.  Windell MouldingEric Jaelon Gatley, MSW, Theresia MajorsLCSWA [phone]

## 2015-08-16 NOTE — Progress Notes (Signed)
Daily Progress Note   Patient Name: [name] [name]       Date: 08/16/2015 DOB: [birth year]  Age: 75 y.o. MRN#: [id number] Attending Physician: Larey Dresser, MD Primary Care Physician: No primary care provider on file. Admit Date: 08/08/2015  Reason for Consultation/Follow-up: Establishing goals of care  Subjective: I have met with [name] [name] along with her husband and daughters at bedside. [name] [name] is progressing as she is much more lethargic, not eating, and requiring some doses of fentanyl for discomfort. However she still is arousable and communicative. Able to tell us when she is uncomfortable. Long discussion with family regarding next step - I do recommend transition to hospice and although she is progressing at EOL this is much more gradual than I believe we originally anticipated although I do believe prognosis is < 2 weeks. Family very reasonable and all questions and concerns answered and addressed. Emotional support provided.   If transition to hospice facility recommend to keep PICC for admin of comfort meds (fentanyl, possibly lasix although doubt this is doing anything at this point with very low output - consider d/c).   Length of Stay: 8  Current Medications: Scheduled Meds:  . famotidine  20 mg Oral Daily  . furosemide  160 mg Intravenous Q8H  . metolazone  5 mg Oral BID  . sodium chloride flush  10-40 mL Intracatheter Q12H    Continuous Infusions:    PRN Meds: acetaminophen, ALPRAZolam, antiseptic oral rinse, fentaNYL (SUBLIMAZE) injection, glycopyrrolate **OR** glycopyrrolate **OR** glycopyrrolate, haloperidol **OR** haloperidol **OR** haloperidol lactate, ondansetron **OR** ondansetron (ZOFRAN) IV, polyvinyl alcohol, promethazine, sodium chloride flush,  zolpidem  Physical Exam  Constitutional: She is oriented to person, place, and time. She appears well-developed.  HENT:  Head: Normocephalic and atraumatic.  Cardiovascular: Normal rate.   Pulmonary/Chest: Effort normal. No accessory muscle usage. No tachypnea. No respiratory distress.  Abdominal: Soft. Normal appearance.  Neurological: She is alert and oriented to person, place, and time.            Vital Signs: BP 81/50 mmHg  Pulse 79  Temp(Src) 98 F (36.7 C) (Oral)  Resp 19  Ht _0  (1.575 m)  Wt 92.8 kg (204 lb 9.4 oz)  BMI 37.41 kg/m2  SpO2 100% SpO2: SpO2: 100 % O2 Device: O2 Device: Nasal Cannula O2  Flow Rate: O2 Flow Rate (L/min): 2 L/min  Intake/output summary:   Intake/Output Summary (Last 24 hours) at 08/16/15 0945 Last data filed at 08/16/15 6389  Gross per 24 hour  Intake      0 ml  Output    300 ml  Net   -300 ml   LBM: Last BM Date: 08/01/15 Baseline Weight: Weight: 91.2 kg (201 lb 1 oz) Most recent weight: Weight: 92.8 kg (204 lb 9.4 oz)       Palliative Assessment/Data:    Flowsheet Rows        Most Recent Value   Intake Tab    Referral Department  Cardiology   Unit at Time of Referral  ICU   Palliative Care Primary Diagnosis  Cardiac   Date Notified  08/10/15   Palliative Care Type  New Palliative care   Reason for referral  Clarify Goals of Care   Date of Admission  08/08/15   # of days IP prior to Palliative referral  2   Clinical Assessment    Psychosocial & Spiritual Assessment    Palliative Care Outcomes       Patient Active Problem List   Diagnosis Date Noted  . Palliative care encounter   . DNR (do not resuscitate)   . Acute on chronic renal failure ([place]) 08/08/2015  . Hyperkalemia 08/08/2015  . Acute on chronic systolic and diastolic heart failure, NYHA class 4 ([place]) 08/08/2015  . NSTEMI (non-ST elevated myocardial infarction) ([place])   . VT (ventricular tachycardia) ([place])   . Acute respiratory failure with hypoxia ([place])    . CKD (chronic kidney disease) stage 3, GFR 30-59 ml/min   . Acute on chronic systolic CHF (congestive heart failure) ([place]) 07/21/2015  . Chronic systolic heart failure ([place]) 06/29/2015  . CKD (chronic kidney disease), stage III 06/29/2015  . Hypotension 06/29/2015  . Sinus bradycardia 04/09/2012  . Chronic renal insufficiency 06/12/2011  . HYPERLIPIDEMIA-MIXED 08/19/2008  . Overweight 08/19/2008  . MITRAL REGURG W/ AORTIC INSUFF, RHEUM/NON-RHEUM 08/19/2008  . Essential hypertension 08/19/2008  . CAD, ARTERY BYPASS GRAFT 08/19/2008  . Cardiomyopathy, ischemic 08/19/2008    Palliative Care Assessment & Plan   Patient Profile: 75 y.o. female with past medical history of CAD, ischemic CMP, and CKD had recent admission with acute/chronic systolic CHF and ended up requiring home milrinone. She was seen on 08/08/2015 in clinic and noted to be volume overloaded. Labs showed hyperkalemia and AKI so she was sent to the ER and subsequently admitted. She is requiring levophed and dobutamine but SVO2 remains very low and UOP remains low. Unfortunately she has not responded well to medical treatment and fear of cardiorenal and may be approaching EOL.    Assessment: Lying in bed. More tired today but arousable and maintains mental clarity.   Recommendations/Plan:  Full comfort care  Pain/dyspnea: Fentanyl prn. May be increase dosage/frequency as needed to achieve comfort.  Nausea: Zofran 4 mg every 6 hours prn.   Goals of Care and Additional Recommendations:  Limitations on Scope of Treatment: Full Comfort Care  Code Status:    Code Status Orders        Start     Ordered   08/12/15 1252  Do not attempt resuscitation (DNR)   Continuous    Question Answer Comment  In the event of cardiac or respiratory ARREST Do not call a "code blue"   In the event of cardiac or respiratory ARREST Do not perform Intubation, CPR, defibrillation or ACLS  In the event of cardiac or respiratory  ARREST Use medication by any route, position, wound care, and other measures to relive pain and suffering. May use oxygen, suction and manual treatment of airway obstruction as needed for comfort.      08/12/15 1252    Code Status History    Date Active Date Inactive Code Status Order ID Comments User Context   08/10/2015  5:06 PM 08/12/2015 12:52 PM DNR [id number]  Pershing Proud, NP Inpatient   08/08/2015  7:47 PM 08/10/2015  5:06 PM Full Code [id number]  Lonn Georgia, PA-C ED   07/21/2015  9:15 AM 08/03/2015  8:14 PM Full Code [id number]  Larey Dresser, MD Inpatient    Advance Directive Documentation        Most Recent Value   Type of Advance Directive  Healthcare Power of Attorney   Pre-existing out of facility DNR order (yellow form or pink MOST form)     "MOST" Form in Place?         Prognosis:   Likely days.   Discharge Planning:  Recommend hospice facility.    Thank you for allowing the Palliative Medicine Team to assist in the care of this patient.   Time In: 0900 Time Out: 0940 Total Time 27mn Prolonged Time Billed  no       Greater than 50%  of this time was spent counseling and coordinating care related to the above assessment and plan.  PPershing Proud NP  Please contact Palliative Medicine Team phone at [phone] questions and concerns.

## 2015-08-16 NOTE — Discharge Summary (Addendum)
Advanced Heart Failure Discharge Note   Discharge Summary   Patient ID: [name] [name] MRN: [id number], DOB/AGE: 1940-05-25 75 y.o. Admit date: 08/08/2015 D/C date:     08/16/2015   Primary Discharge Diagnoses:  1. Acute on chronic systolic CHF: Ischemic cardiomyopathy. - End stage 2. CAD s/p CABG 3. Acute Renal failure on CKD stage III 4. DNR/DNI Hospice  Consults Renal 08/09/15 Palliative Care 08/10/15  Hospital Course:  [name] [name] is a 75 yo with history of CAD s/p CABG, ischemic cardiomyopathy, and CKD presents for CHF clinic evaluation. She had CABG in 2002. Last Cardiolite in 8/16 showed extensive areas of infarction with only mild peri-infarct ischemia.   In 4/17, she developed increased exertional dyspnea. She was admitted at [facility] in 5/17. Prior to admission, she reports some burning across her upper chest that she thought was heartburn. At [facility], she was started on dopamine and diuresed. Echo showed EF < 25%, which was worsened compared to echo in 7/16 with EF 40%.   She was readmitted in 6/17 with acute on chronic systolic CHF. She was markedly volume overloaded. She was diuresed with high dose Lasix 160 mg IV tid + metolazone + milrinone gtt. We were unable to titrate her off milrinone due to persistently low co-ox and she went home on milrinone 0.25.   Admitted 08/08/15 from clinic after labs showed ARF with Creatinine of 3.95 and K of 6.2. She was volume overloaded on exam and we had hoped to increase metolazone at that visit, but with results above opted for admission.  She was given treatment for Kyperkalemia in ED.  Coox noted to be 52% despite milrinone and CVP 22 indicating marked volume overload.   Renal consulted with continued worsening of creatinine and only modest urine output.    She continued to require dual pressors (switched to dobutamine and norepi) though her mixed venous saturation remained low with poor UOP. We discussed CVVH trial  with renal, however with her low BP and tenuous status this was thought to be a poor option.    [name] [name] was seen by Palliative care during this admission, and after several conversations, family and [name] [name] reached conclusion on 08/12/15 that we had done all we could do, and [name] [name] transitioned to DNR/DNI.  Opted to progress to complete comfort care, with knowledge that she would likely pass away during this admission.   Patient suprisingly remained stable for several days off of all pressor support and was thought to be stable for transport to residential hospice.  Expected prognosis <2 weeks.     Every effort was made during this and previous admission to improve this patients clinical picture.   We will continue torsemide for palliative diuresis.  Can stop once [name] [name] no longer tolerates po meds.  Further comfort meds per hospice home.  Physical Exam: General: NAD Neck: JVP 12 cm+, no thyromegaly or thyroid nodule.  Lungs: Dependent crackles.  CV: Nondisplaced PMI. Heart regular S1/S2, no S3/S4, 1/6 SEM RUSB. R and LLE 1-2+ edema to knees bilaterally.  Abdomen: Soft, non-tender, non-distended, no HSM. No bruits or masses. +BS  Neurologic: Sleeping.  Extremities: No clubbing or cyanosis.   Discharge Weight Range: 204 lbs Discharge Vitals: Blood pressure 81/50, pulse 79, temperature 98 F (36.7 C), temperature source Oral, resp. rate 19, height 5\' 2"  (1.575 m), weight 204 lb 9.4 oz (92.8 kg), SpO2 100 %.  Labs: Lab Results  Component Value Date   WBC 4.1 08/12/2015   HGB 10.1*  08/12/2015   HCT 32.1* 08/12/2015   MCV 93.3 08/12/2015   PLT 207 08/12/2015    Recent Labs Lab 08/10/15 0330  08/12/15 0431  NA 130*  < > 128*  K 3.9  < > 3.3*  CL 91*  < > 87*  CO2 25  < > 25  BUN 67*  < > 64*  CREATININE 3.49*  < > 3.21*  CALCIUM 9.6  < > 9.4  PROT 7.0  --   --   BILITOT 1.3*  --   --   ALKPHOS 85  --   --   ALT 38  --   --   AST 22  --   --   GLUCOSE 191*  < > 138*  < > = values in  this interval not displayed. No results found for: CHOL, HDL, LDLCALC, TRIG BNP (last 3 results)  Recent Labs  07/20/15 1146 07/21/15 1438 08/08/15 1853  BNP 2211.4* 1795.7* 1613.9*    ProBNP (last 3 results) No results for input(s): PROBNP in the last 8760 hours.   Diagnostic Studies/Procedures   No results found.  Discharge Medications     Medication List    STOP taking these medications        aspirin 81 MG tablet     atorvastatin 40 MG tablet  Commonly known as:  LIPITOR     D-3-5 5000 units capsule  Generic drug:  Cholecalciferol     Fish Oil 1200 MG Caps     HUMALOG MIX 75/25 (75-25) 100 UNIT/ML Susp injection  Generic drug:  insulin lispro protamine-lispro     isosorbide-hydrALAZINE 20-37.5 MG tablet  Commonly known as:  BIDIL     metolazone 2.5 MG tablet  Commonly known as:  ZAROXOLYN     milrinone 20 MG/100 ML Soln infusion  Commonly known as:  PRIMACOR     nitroGLYCERIN 0.4 MG SL tablet  Commonly known as:  NITROSTAT     potassium chloride SA 20 MEQ tablet  Commonly known as:  K-DUR,KLOR-CON     ranitidine 150 MG tablet  Commonly known as:  ZANTAC     spironolactone 25 MG tablet  Commonly known as:  ALDACTONE      TAKE these medications        ALPRAZolam 0.25 MG tablet  Commonly known as:  XANAX  Take 1 tablet (0.25 mg total) by mouth 2 (two) times daily as needed for anxiety.     ondansetron 4 MG disintegrating tablet  Commonly known as:  ZOFRAN-ODT  Take 1 tablet (4 mg total) by mouth every 6 (six) hours as needed for nausea.     polyvinyl alcohol 1.4 % ophthalmic solution  Commonly known as:  LIQUIFILM TEARS  Place 1 drop into both eyes 4 (four) times daily as needed for dry eyes.     torsemide 20 MG tablet  Commonly known as:  DEMADEX  Take 4 tablets (80 mg total) by mouth 2 (two) times daily.        Disposition   The patient will be discharged in stable but tenuous condition to [facility].   Expected prognosis < 2 weeks.   Discharge Instructions    Diet - low sodium heart healthy    Complete by:  As directed      Discharge instructions    Complete by:  As directed   Comfort meds per hospice home.  Can continue torsemide as palliative diuresis until [name] [name] can no longer tolerate  po meds.     Increase activity slowly    Complete by:  As directed              Duration of Discharge Encounter: Greater than 35 minutes   Signed, Graciella FreerMichael Andrew Adyan Palau PA-C 08/16/2015, 1:13 PM  Patient seen with PA, agree with the above note.  OK to discharge to hospice house.   Mariam DollarMichael Andrew [facility] 08/16/2015 1:13 PM

## 2015-08-16 NOTE — Care Management Note (Addendum)
Case Management Note  Patient Details  Name: [name] [name] MRN: [id number] Date of Birth: 06/04/1940  Subjective/Objective:                    Action/Plan:  Spoke to Yong ChannelAlicia Parker NP for Palliative , patient is stable for transfer . Spoke with patient's husband and multiple family members at bedside . Explained level of care and patient is stable for transfer . Family asking if patient could remain in hospital . Explained GIP and criteria for GIP ( General in hospital Hospice ) . Since patient stable for transfer etc she is not eligible for GIP. Family voiced understanding.    Explained home with hospice . Daughter stated patient does not want to go home . Husband and rest of family members in agreement. They are interested in residential hospice. SW aware .   Graciella FreerMichael Andrew Tillery, PA-C with cardiology aware. Expected Discharge Date:                  Expected Discharge Plan:  Hospice Medical Facility  In-House Referral:     Discharge planning Services  CM Consult  Post Acute Care Choice:  Hospice Choice offered to:  Spouse, Adult Children  DME Arranged:    DME Agency:     HH Arranged:    HH Agency:     Status of Service:  In process, will continue to follow  If discussed at Long Length of Stay Meetings, dates discussed:    Additional Comments:  Kingsley PlanWile, [name], RN 08/16/2015, 9:51 AM

## 2015-08-30 DEATH — deceased

## 2016-04-23 IMAGING — NM NM MYOCAR MULTI W/SPECT W/WALL MOTION & EF
2 series · 12 of 12 positions shown · non-contrast
Comparison: none

[Series 1: rest · 8.28mm/px · 6 of 64 frames shown]
[frame 6/64]
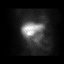
[frame 16/64]
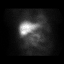
[frame 27/64]
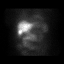
[frame 38/64]
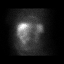
[frame 48/64]
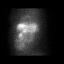
[frame 59/64]
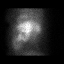

[Series 2: stress gated · 8.28mm/px · 6 of 64 frames shown]
[frame 6/64]
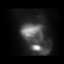
[frame 16/64]
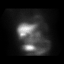
[frame 27/64]
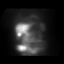
[frame 38/64]
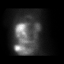
[frame 48/64]
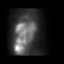
[frame 59/64]
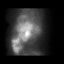

[12 of 12 positions shown; findings below may reference images not displayed]

Canned report from images found in remote index.

Refer to host system for actual result text.

## 2017-03-09 IMAGING — CR DG CHEST 2V
2 series · 2 of 2 positions shown · non-contrast
Comparison: Single-view of the chest 07/22/2015.

CLINICAL DATA: Weakness of bilateral lower extremity swelling for 2
days.

EXAM:
CHEST  2 VIEW

[chest lat]
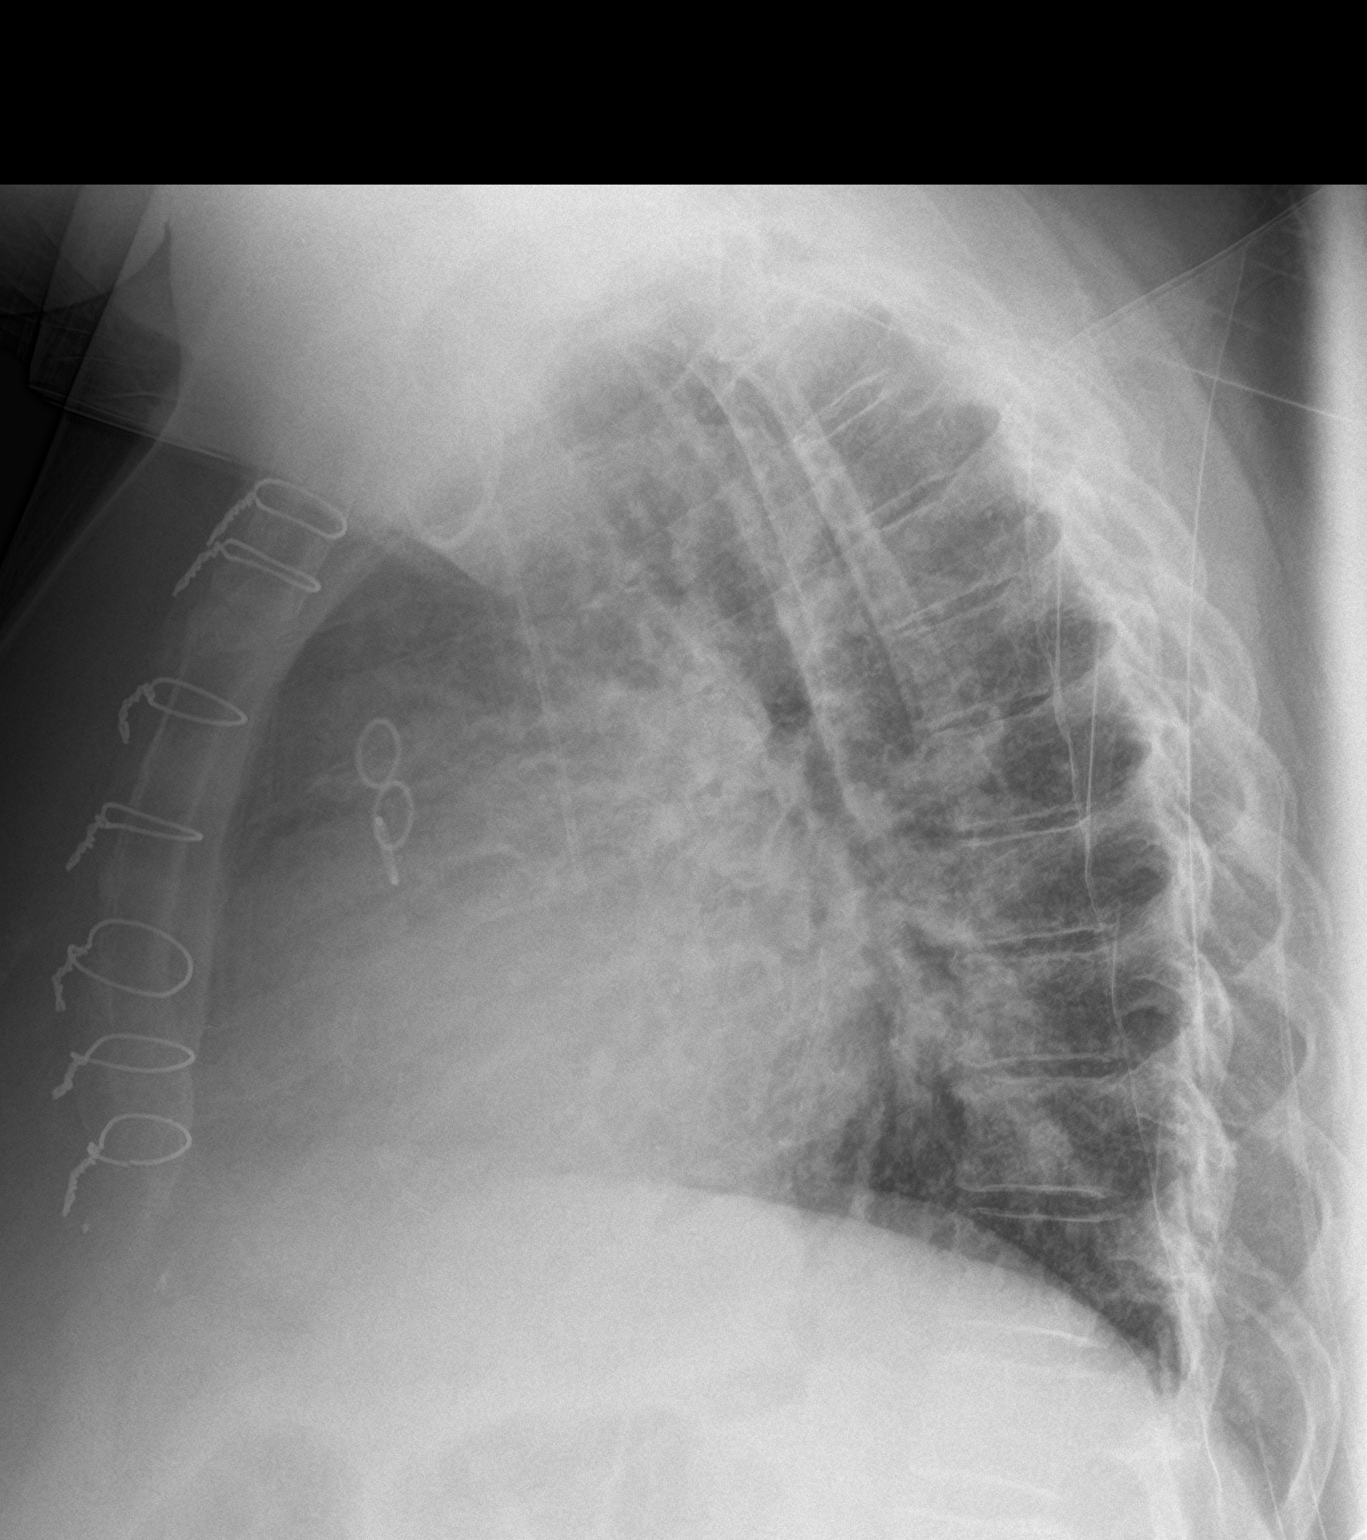

[chest ap]
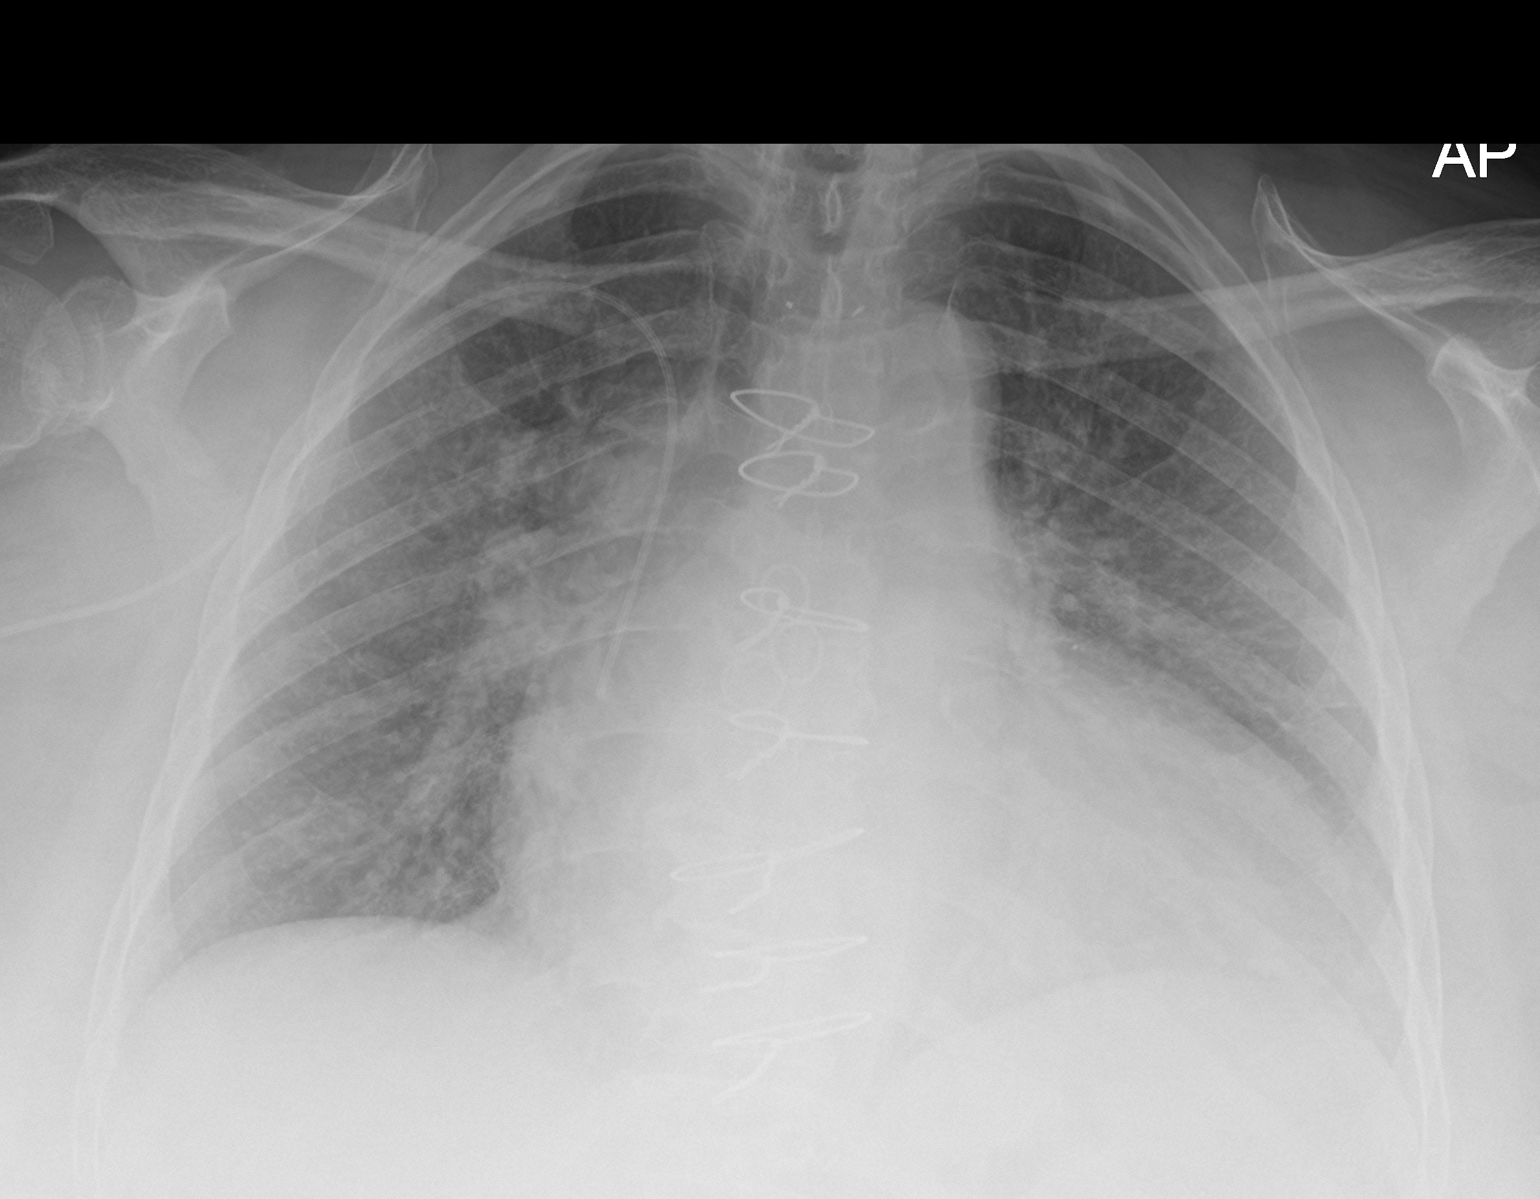

[2 of 2 positions shown; findings below may reference images not displayed]

FINDINGS: There is marked cardiomegaly. Interstitial pulmonary edema is
identified and new since the prior exam. No pneumothorax or pleural
effusion. The patient is status post CABG. Right PICC is unchanged.
IMPRESSION: Interstitial pulmonary edema and marked cardiomegaly.
